# Patient Record
Sex: Female | Born: 1970 | Race: Black or African American | Hispanic: No | Marital: Married | Smoking: Never smoker
Health system: Southern US, Community
[De-identification: ages and names within clinical notes are randomized; demographics above are authoritative.]

## PROBLEM LIST (undated history)

## (undated) DIAGNOSIS — F419 Anxiety disorder, unspecified: Secondary | ICD-10-CM

## (undated) DIAGNOSIS — K5900 Constipation, unspecified: Secondary | ICD-10-CM

## (undated) DIAGNOSIS — F32A Depression, unspecified: Secondary | ICD-10-CM

## (undated) DIAGNOSIS — F329 Major depressive disorder, single episode, unspecified: Secondary | ICD-10-CM

## (undated) DIAGNOSIS — G500 Trigeminal neuralgia: Secondary | ICD-10-CM

## (undated) DIAGNOSIS — J302 Other seasonal allergic rhinitis: Secondary | ICD-10-CM

## (undated) HISTORY — DX: Major depressive disorder, single episode, unspecified: F32.9

## (undated) HISTORY — DX: Constipation, unspecified: K59.00

## (undated) HISTORY — DX: Anxiety disorder, unspecified: F41.9

## (undated) HISTORY — PX: TUBAL LIGATION: SHX77

## (undated) HISTORY — DX: Other seasonal allergic rhinitis: J30.2

## (undated) HISTORY — PX: CEREBRAL MICROVASCULAR DECOMPRESSION: SHX1328

## (undated) HISTORY — DX: Depression, unspecified: F32.A

---

## 1998-01-28 ENCOUNTER — Other Ambulatory Visit: Admission: RE | Admit: 1998-01-28 | Discharge: 1998-01-28 | Payer: Self-pay | Admitting: Gynecology

## 1999-03-08 ENCOUNTER — Other Ambulatory Visit: Admission: RE | Admit: 1999-03-08 | Discharge: 1999-03-08 | Payer: Self-pay | Admitting: Gynecology

## 1999-05-21 ENCOUNTER — Inpatient Hospital Stay (HOSPITAL_COMMUNITY): Admission: AD | Admit: 1999-05-21 | Discharge: 1999-05-21 | Payer: Self-pay | Admitting: Gynecology

## 1999-05-21 ENCOUNTER — Encounter: Payer: Self-pay | Admitting: Gynecology

## 1999-06-17 ENCOUNTER — Other Ambulatory Visit: Admission: RE | Admit: 1999-06-17 | Discharge: 1999-06-17 | Payer: Self-pay | Admitting: Gynecology

## 1999-07-14 ENCOUNTER — Encounter: Admission: RE | Admit: 1999-07-14 | Discharge: 1999-10-12 | Payer: Self-pay | Admitting: Gynecology

## 1999-12-13 ENCOUNTER — Encounter: Payer: Self-pay | Admitting: Gynecology

## 1999-12-14 ENCOUNTER — Encounter: Payer: Self-pay | Admitting: Gynecology

## 1999-12-14 ENCOUNTER — Inpatient Hospital Stay (HOSPITAL_COMMUNITY): Admission: AD | Admit: 1999-12-14 | Discharge: 1999-12-15 | Payer: Self-pay | Admitting: Internal Medicine

## 1999-12-19 ENCOUNTER — Inpatient Hospital Stay (HOSPITAL_COMMUNITY): Admission: AD | Admit: 1999-12-19 | Discharge: 1999-12-22 | Payer: Self-pay | Admitting: Gynecology

## 1999-12-19 ENCOUNTER — Encounter (INDEPENDENT_AMBULATORY_CARE_PROVIDER_SITE_OTHER): Payer: Self-pay | Admitting: Specialist

## 1999-12-25 ENCOUNTER — Encounter: Admission: RE | Admit: 1999-12-25 | Discharge: 2000-03-24 | Payer: Self-pay | Admitting: *Deleted

## 2000-01-24 ENCOUNTER — Other Ambulatory Visit: Admission: RE | Admit: 2000-01-24 | Discharge: 2000-01-24 | Payer: Self-pay | Admitting: Gynecology

## 2001-01-29 ENCOUNTER — Other Ambulatory Visit: Admission: RE | Admit: 2001-01-29 | Discharge: 2001-01-29 | Payer: Self-pay | Admitting: Gynecology

## 2002-03-05 ENCOUNTER — Other Ambulatory Visit: Admission: RE | Admit: 2002-03-05 | Discharge: 2002-03-05 | Payer: Self-pay | Admitting: Gynecology

## 2002-12-16 ENCOUNTER — Emergency Department (HOSPITAL_COMMUNITY): Admission: EM | Admit: 2002-12-16 | Discharge: 2002-12-16 | Payer: Self-pay | Admitting: Emergency Medicine

## 2003-01-06 ENCOUNTER — Emergency Department (HOSPITAL_COMMUNITY): Admission: EM | Admit: 2003-01-06 | Discharge: 2003-01-06 | Payer: Self-pay | Admitting: Emergency Medicine

## 2003-03-21 ENCOUNTER — Emergency Department (HOSPITAL_COMMUNITY): Admission: EM | Admit: 2003-03-21 | Discharge: 2003-03-21 | Payer: Self-pay | Admitting: Emergency Medicine

## 2003-03-25 ENCOUNTER — Other Ambulatory Visit: Admission: RE | Admit: 2003-03-25 | Discharge: 2003-03-25 | Payer: Self-pay | Admitting: Gynecology

## 2003-12-19 ENCOUNTER — Ambulatory Visit (HOSPITAL_COMMUNITY): Admission: RE | Admit: 2003-12-19 | Discharge: 2003-12-19 | Payer: Self-pay | Admitting: Gynecology

## 2003-12-19 ENCOUNTER — Encounter (INDEPENDENT_AMBULATORY_CARE_PROVIDER_SITE_OTHER): Payer: Self-pay | Admitting: *Deleted

## 2006-07-24 ENCOUNTER — Emergency Department (HOSPITAL_COMMUNITY): Admission: EM | Admit: 2006-07-24 | Discharge: 2006-07-24 | Payer: Self-pay | Admitting: Emergency Medicine

## 2006-11-15 ENCOUNTER — Inpatient Hospital Stay (HOSPITAL_COMMUNITY): Admission: AD | Admit: 2006-11-15 | Discharge: 2006-11-15 | Payer: Self-pay | Admitting: Obstetrics & Gynecology

## 2006-12-21 ENCOUNTER — Inpatient Hospital Stay (HOSPITAL_COMMUNITY): Admission: AD | Admit: 2006-12-21 | Discharge: 2006-12-22 | Payer: Self-pay | Admitting: Obstetrics and Gynecology

## 2007-01-12 ENCOUNTER — Inpatient Hospital Stay (HOSPITAL_COMMUNITY): Admission: AD | Admit: 2007-01-12 | Discharge: 2007-01-12 | Payer: Self-pay | Admitting: Obstetrics and Gynecology

## 2007-04-09 ENCOUNTER — Inpatient Hospital Stay (HOSPITAL_COMMUNITY): Admission: AD | Admit: 2007-04-09 | Discharge: 2007-04-09 | Payer: Self-pay | Admitting: Obstetrics and Gynecology

## 2007-05-14 ENCOUNTER — Inpatient Hospital Stay (HOSPITAL_COMMUNITY): Admission: AD | Admit: 2007-05-14 | Discharge: 2007-05-14 | Payer: Self-pay | Admitting: Obstetrics and Gynecology

## 2007-05-24 ENCOUNTER — Inpatient Hospital Stay (HOSPITAL_COMMUNITY): Admission: AD | Admit: 2007-05-24 | Discharge: 2007-05-24 | Payer: Self-pay | Admitting: Obstetrics and Gynecology

## 2007-06-11 ENCOUNTER — Observation Stay (HOSPITAL_COMMUNITY): Admission: AD | Admit: 2007-06-11 | Discharge: 2007-06-12 | Payer: Self-pay | Admitting: Obstetrics and Gynecology

## 2007-06-26 ENCOUNTER — Inpatient Hospital Stay (HOSPITAL_COMMUNITY): Admission: AD | Admit: 2007-06-26 | Discharge: 2007-06-29 | Payer: Self-pay | Admitting: Obstetrics and Gynecology

## 2007-06-26 ENCOUNTER — Inpatient Hospital Stay (HOSPITAL_COMMUNITY): Admission: AD | Admit: 2007-06-26 | Discharge: 2007-06-26 | Payer: Self-pay | Admitting: Obstetrics and Gynecology

## 2007-06-27 ENCOUNTER — Encounter (INDEPENDENT_AMBULATORY_CARE_PROVIDER_SITE_OTHER): Payer: Self-pay | Admitting: Obstetrics and Gynecology

## 2007-09-09 ENCOUNTER — Emergency Department (HOSPITAL_COMMUNITY): Admission: EM | Admit: 2007-09-09 | Discharge: 2007-09-09 | Payer: Self-pay | Admitting: Emergency Medicine

## 2009-08-21 ENCOUNTER — Emergency Department (HOSPITAL_COMMUNITY): Admission: EM | Admit: 2009-08-21 | Discharge: 2009-08-21 | Payer: Self-pay | Admitting: Emergency Medicine

## 2009-10-20 ENCOUNTER — Encounter: Admission: RE | Admit: 2009-10-20 | Discharge: 2009-10-20 | Payer: Self-pay | Admitting: Family Medicine

## 2009-12-07 ENCOUNTER — Encounter: Admission: RE | Admit: 2009-12-07 | Discharge: 2009-12-07 | Payer: Self-pay | Admitting: Family Medicine

## 2010-01-23 ENCOUNTER — Encounter: Payer: Self-pay | Admitting: Family Medicine

## 2010-05-18 NOTE — Op Note (Signed)
Kathleen Odonnell, Kathleen Odonnell                ACCOUNT NO.:  [id number]   MEDICAL RECORD NO.:  [id number]          PATIENT TYPE:  INP   LOCATION:  9122                          FACILITY:  [facility]   PHYSICIAN:  Naima A. Dillard, M.D. DATE OF BIRTH:  1970-04-04   DATE OF PROCEDURE:  06/27/2007  DATE OF DISCHARGE:                               OPERATIVE REPORT   PREOPERATIVE DIAGNOSIS:  Multiparity, desires sterilization.   POSTOPERATIVE DIAGNOSIS:  Multiparity, desire sterilization.   PROCEDURE:  Postpartum tubal ligation.   SURGEON:  Naima A. Normand Sloop, M.D.   ANESTHESIA:  General.   ESTIMATED BLOOD LOSS:  Minimal.   COMPLICATIONS:  None.   SPECIMENS:  Portions of right and left tubes sent to pathology.   FINDINGS:  Normal-appearing tubes bilaterally.  The patient went to  recovery room in stable condition.   PROCEDURE IN DETAIL:  Before the procedure, the patient was given the  risk, however, not limited to bleeding, infection, damage to internal  organs and also failure rate of about 1 in 200 which could result in  ectopic pregnancy.  The patient still desired to proceed.  She was taken  to the operating room, given general anesthesia, prepped and draped in  normal sterile fashion.  Bladder was also drained with in and out  catheter.  A 10-mm incision was made with the scalpel after 5 mL of  Marcaine was used in the incision and carried down to the fascia.  The  fascia was incised to the midline extended bilaterally using Mayo  scissors.  Peritoneum was identified, tented up, sharply.  The patient's  right fallopian tube was grasped with Babcock clamps, followed up to  fimbriated end about a centimeter.  Tube was ligated with 2-0 plain at  the mid portion of the tube and excised.  Hemostasis was assured.  The  patient's left mid portion of the tube was ligated with 2-0 plain and  excised about a centimeter.  Hemostasis was assured.  The tubes were  returned to the abdomen.  The fascia was  closed using 0 Vicryl.  The  skin was closed using 3-0 Monocryl in subcuticular fashion.  Sponge, lap  and needle counts were correct.  The patient went to recovery room in  stable condition.      Naima A. Normand Sloop, M.D.  Electronically Signed     NAD/MEDQ  D:  06/27/2007  T:  06/28/2007  Job:  [id number]
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
## 2010-05-18 NOTE — Discharge Summary (Signed)
Kathleen Odonnell, Kathleen Odonnell                ACCOUNT NO.:  [id number]   MEDICAL RECORD NO.:  [id number]          PATIENT TYPE:  INP   LOCATION:  9173                          FACILITY:  [facility]   PHYSICIAN:  Osborn Coho, M.D.   DATE OF BIRTH:  [birth year]   DATE OF ADMISSION:  06/11/2007  DATE OF DISCHARGE:  06/12/2007                               DISCHARGE SUMMARY   ADMITTING PHYSICIAN:  Dois Davenport A. Rivard, MD   DISCHARGING PHYSICIAN:  Osborn Coho, MD   ADMISSION DIAGNOSES:  1. Intrauterine pregnancy at 35-1/7 weeks.  2. Preterm contractions with no cervical change.   DISCHARGE DIAGNOSES:  1. Intrauterine pregnancy at 35-1/7 weeks.  2. Preterm contractions with no cervical change.   HOSPITAL PROCEDURES:  1. Electronic fetal monitoring.  2. IV fluid administration.  3. Analgesia.   HOSPITAL COURSE:  The patient was admitted to maternity admissions on  June 11, 2007, for contractions at 35-1/7 weeks.  Her cervix was 2, 75,  and -2.  She continued throughout the evening to have contractions  anywhere from 3-10 minutes apart with extreme discomfort.  She did not  change her cervix but was contracting too frequently to discharge home,  so she was admitted for overnight observation, IV hydration, and  analgesia.  She was given some Stadol through her IV and then later some  morphine as an injection and received IV fluids throughout the night.  The baby remained reactive and the patient did not sleep, but did  received some relief from the analgesia.  By morning time, fetal heart  rate was reactive.  There were very few contractions.  Cervix was  unchanged at 2, 50, and -3 vertex and the patient was feeling much more  comfortable and was ready to go home.   DISCHARGE MEDICATIONS:  The patient has Darvocet for p.r.n. use at home  and also has terbutaline 2.5 mg p.o. q.4 h. p.r.n. for use at home.  No  other prescriptions were given.   DISCHARGE LABORATORY DATA:  None except for her UA  which showed a few  bacteria on micro and trace leukocytes and was sent for C&S.   DISCHARGE INSTRUCTIONS:  Monitoring for labor and monitoring for  movement.   DISCHARGE FOLLOWUP:  On Friday, June 15, 2007, or p.r.n.   CONDITION ON DISCHARGE:  Good.      Marie L. Williams, C.N.M.      Osborn Coho, M.D.  Electronically Signed    MLW/MEDQ  D:  06/12/2007  T:  06/13/2007  Job:  [id number]

## 2010-05-21 NOTE — Discharge Summary (Signed)
[facility]  Patient:    Kathleen Odonnell, Kathleen Odonnell Visit Number: [id number] MRN: [id number]          Service Type: OBS Location: 910B [id number] Attending Physician:  Tonye Royalty Dictated by:   Antony Contras, [facility] Admit Date:  12/19/1999 Discharge Date: 12/22/1999                             Discharge Summary  DISCHARGE DIAGNOSES:          1. Intrauterine pregnancy at 36 weeks.                               2. Labile pregnancy induced hypertension with                                  proteinuria.                               3. Oligohydramnios.  PROCEDURES:                   1. Induction of labor.                               2. Normal spontaneous vaginal delivery of                                  viable infant over intact perineum.  HISTORY OF PRESENT ILLNESS:   Patient is a 40 year old primigravida with an LMP of April 11, 1999 and [facility] January 16, 2000. Prenatal course was complicated labile pregnancy induced hypertension with proteinuria, oligohydramnios, headaches, lower extremity swelling, and elevated blood pressure. For both of these problems, she will be admitted for induction of labor at 36 weeks.  HOSPITAL COURSE/TREATMENT:    Patient was admitted on December 20, 1999 for induction of labor. Labor induction was initiated with Cytotec and followed by Pitocin. Artificial rupture of membranes revealed clear amniotic fluid. Cervix was 2 cm, 80%, and -1 station. Patient did experience some decreased long-term variability with good recovery, and some subtle late decelerations were noted towards the beginning of the second stage of labor which resolved. She did deliver an Apgar 8/9 female infant over an intact perineum. Birth weight was 4 pounds 15 ounces.  Postpartum, patient remained afebrile, had no difficulty voiding, blood pressures were in the 120-140/80-90 range. CBC: hematocrit 32.6, hemoglobin 11.4, white blood cells 12.6,  platelets 132. She was able to be discharged on her second postpartum day in satisfactory condition.  DISPOSITION:                  Follow up in the office in six weeks, continue prenatal vitamins and iron, Motrin/Tylox for pain. Dictated by:   Antony Contras, [facility] Attending Physician:  Tonye Royalty DD:  01/17/00 TD:  01/17/00 Job: [id number]
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
## 2010-05-21 NOTE — Discharge Summary (Signed)
[facility]  Patient:    Kathleen Odonnell, Kathleen Odonnell                       MRN: [id number] Adm. Date:  [id number] Disc. Date: [id number] Attending:  Tonye Royalty Dictator:   Antony Contras, [facility]                           Discharge Summary  DISCHARGE DIAGNOSES:          1. Intrauterine pregnancy at 35+ weeks.                               2. Headaches.                               3. Epigastric pain.  HISTORY OF PRESENT ILLNESS:   The patient is a 40 year old primigravida with LMP of April 11, 1999, [facility] January 16, 2000.  The patient around [redacted] weeks gestation began experiencing some midepigastric pain, headaches, and also noted some swelling of her lower extremities.  Initial blood pressures were found to be 150/90, 124/78.  She had also had 1+ proteinuria in her urine and 3+ pitting edema.  There was also evidence on ultrasound of oligohydramnios. Biophysical profile was 10/10.  PRENATAL LABORATORY DATA:     Blood type B positive, antibody screen negative, sickle cell negative.  RPR, HBsAg, and HIV nonreactive.  MS-AFP negative.  HOSPITAL COURSE:              She was admitted for observation of PIH panel which previously had been normal on November 7 and November 28.  PIH labs were within normal pregnancy parameters.  The patient did continue to complain of headache and some right upper quadrant pain.  Magnesium sulfate was initiated, and a 24-hour urine was also collected for total protein and creatinine clearance.  The patient continued to have headache which did resolve after magnesium sulfate was discontinued.  She continued to improve.  By December 1, her headache had resolved, she had no visual changes or epigastric pain.  The plan was to discharge the patient if 24-hour urine was normal.  She was to maintain bed rest at home and recheck an AFI, NST, and blood pressure in 48 hours.  She was able to be discharged on December 15, 1999.  FOLLOWUP:                     Follow up in the office in 48 hours.  Continue with bed rest at home. DD:  02/21/00 TD:  02/22/00 Job: [id number]

## 2010-05-21 NOTE — Op Note (Signed)
NAMECHANTALE, LEUGERS                ACCOUNT NO.:  [id number]   MEDICAL RECORD NO.:  [id number]          PATIENT TYPE:  AMB   LOCATION:  SDC                           FACILITY:  [facility]   PHYSICIAN:  Ivor Costa. Farrel Gobble, M.D. DATE OF BIRTH:  08-18-1970   DATE OF PROCEDURE:  12/19/2003  DATE OF DISCHARGE:                                 OPERATIVE REPORT   PREOPERATIVE DIAGNOSES:  Undesired pregnancy.   POSTOPERATIVE DIAGNOSES:  Undesired pregnancy.   PROCEDURE:  Voluntary interruption.   SURGEON:  Ivor Costa. Farrel Gobble, M.D.   ANESTHESIA:  MAC with paracervical block.   ESTIMATED BLOOD LOSS:  Minimal.   FINDINGS:  Axial uterus approximately 12 week size with smooth contours.   PATHOLOGY:  Uterine contents.   DESCRIPTION OF PROCEDURE:  The patient was taken to the operating room, IV  sedation MAC was induced, placed in dorsal lithotomy position, prepped and  draped in the usual sterile fashion.  Bimanual exam was performed, the  orientation of the uterus confirmed.  A bivalve speculum was placed in the  vagina, the cervix was stabilized with a single tooth tenaculum.  Paracervical block with equal portions of 2% lidocaine and 0.25% Marcaine  were placed circumferentially around the cervix.  The orientation of the  canal was confirmed with the uterine sound, the cervix was then dilated up  to 38 [place] after which a #12 suction curette was advanced through the  cervix towards the fundus.  An amniotomy was performed immediately.  Passage  of fetus and what appeared to be placental tissue occurred immediately on a  single pass. The suction was then readvanced towards the fundus and scant  tissue was obtained.  Polyp forceps were then advanced through the cervix  and gently palpated around, it felt empty. A gentle curettage showed normal  crie and smooth contours. The suction curette was advanced for a third time  and no blood was obtained.  The patient tolerated the procedure well.  Sponge,  sharp, needle counts correct x2.  She was given Methergine  intraoperatively and transferred to the PACU in stable condition.     Trac   THL/MEDQ  D:  12/19/2003  T:  12/20/2003  Job:  [id number]
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
## 2010-09-22 LAB — CBC
Hemoglobin: 11.2 — ABNORMAL LOW
Platelets: 241
RDW: 14.2
WBC: 11.1 — ABNORMAL HIGH

## 2010-09-22 LAB — URINALYSIS, ROUTINE W REFLEX MICROSCOPIC
Bilirubin Urine: NEGATIVE
Hgb urine dipstick: NEGATIVE
Ketones, ur: 15 — AB
Specific Gravity, Urine: 1.02
Urobilinogen, UA: 1

## 2010-09-22 LAB — DIFFERENTIAL
Basophils Absolute: 0
Lymphocytes Relative: 18
Lymphs Abs: 2
Neutro Abs: 8.2 — ABNORMAL HIGH

## 2010-09-22 LAB — GC/CHLAMYDIA PROBE AMP, GENITAL: Chlamydia, DNA Probe: NEGATIVE

## 2010-09-22 LAB — WET PREP, GENITAL: Clue Cells Wet Prep HPF POC: NONE SEEN

## 2010-09-28 LAB — URINALYSIS, ROUTINE W REFLEX MICROSCOPIC
Nitrite: NEGATIVE
Specific Gravity, Urine: <1.005 — ABNORMAL LOW
pH: 5.5

## 2010-09-29 LAB — URINALYSIS, ROUTINE W REFLEX MICROSCOPIC
Nitrite: NEGATIVE
Specific Gravity, Urine: 1.01
Urobilinogen, UA: 0.2

## 2010-09-29 LAB — URINE MICROSCOPIC-ADD ON

## 2010-09-30 LAB — STREP B DNA PROBE: Strep Group B Ag: NEGATIVE

## 2010-09-30 LAB — SYPHILIS: RPR W/REFLEX TO RPR TITER AND TREPONEMAL ANTIBODIES, TRADITIONAL SCREENING AND DIAGNOSIS ALGORITHM: RPR Ser Ql: NONREACTIVE

## 2010-09-30 LAB — URINALYSIS, ROUTINE W REFLEX MICROSCOPIC
Bilirubin Urine: NEGATIVE
Ketones, ur: NEGATIVE
Nitrite: NEGATIVE
Urobilinogen, UA: 0.2
pH: 7.5

## 2010-09-30 LAB — URINE MICROSCOPIC-ADD ON

## 2010-09-30 LAB — CBC
HCT: 32.7 — ABNORMAL LOW
Hemoglobin: 10.8 — ABNORMAL LOW
MCHC: 33.2
MCV: 84.9
MCV: 85.3
Platelets: 202
RBC: 3.09 — ABNORMAL LOW
RBC: 3.85 — ABNORMAL LOW
RDW: 16.1 — ABNORMAL HIGH
WBC: 12.5 — ABNORMAL HIGH
WBC: 12.6 — ABNORMAL HIGH

## 2010-10-08 LAB — URINALYSIS, ROUTINE W REFLEX MICROSCOPIC
Glucose, UA: NEGATIVE
Ketones, ur: NEGATIVE
Specific Gravity, Urine: 1.01
pH: 6.5

## 2010-10-12 LAB — URINALYSIS, ROUTINE W REFLEX MICROSCOPIC
Nitrite: NEGATIVE
Specific Gravity, Urine: 1.01
pH: 7

## 2010-10-12 LAB — WET PREP, GENITAL
Clue Cells Wet Prep HPF POC: NONE SEEN
Trich, Wet Prep: NONE SEEN
Yeast Wet Prep HPF POC: NONE SEEN

## 2010-10-12 LAB — HCG, QUANTITATIVE, PREGNANCY: hCG, Beta Chain, Quant, S: 4906 — ABNORMAL HIGH

## 2010-10-12 LAB — CBC
MCV: 87.4
Platelets: 307
RBC: 4.04
WBC: 8.4

## 2010-10-12 LAB — ABO/RH: ABO/RH(D): B POS

## 2010-10-12 LAB — POCT PREGNANCY, URINE: Operator id: 120561

## 2011-02-17 ENCOUNTER — Other Ambulatory Visit: Payer: Self-pay | Admitting: Family Medicine

## 2011-02-17 DIAGNOSIS — N63 Unspecified lump in unspecified breast: Secondary | ICD-10-CM

## 2011-02-24 ENCOUNTER — Ambulatory Visit
Admission: RE | Admit: 2011-02-24 | Discharge: 2011-02-24 | Disposition: A | Discharge status: Home / Self Care | Payer: BC Managed Care – PPO | Source: Ambulatory Visit | Attending: Family Medicine | Admitting: Family Medicine

## 2011-02-24 DIAGNOSIS — N63 Unspecified lump in unspecified breast: Secondary | ICD-10-CM

## 2012-02-02 ENCOUNTER — Other Ambulatory Visit: Payer: Self-pay | Admitting: Family Medicine

## 2012-02-02 DIAGNOSIS — Z1231 Encounter for screening mammogram for malignant neoplasm of breast: Secondary | ICD-10-CM

## 2012-02-27 ENCOUNTER — Ambulatory Visit
Admission: RE | Admit: 2012-02-27 | Discharge: 2012-02-27 | Disposition: A | Discharge status: Home / Self Care | Payer: BC Managed Care – PPO | Source: Ambulatory Visit | Attending: Family Medicine | Admitting: Family Medicine

## 2012-02-27 DIAGNOSIS — Z1231 Encounter for screening mammogram for malignant neoplasm of breast: Secondary | ICD-10-CM

## 2013-03-27 ENCOUNTER — Other Ambulatory Visit: Payer: Self-pay

## 2013-03-27 DIAGNOSIS — Z1231 Encounter for screening mammogram for malignant neoplasm of breast: Secondary | ICD-10-CM

## 2013-04-05 ENCOUNTER — Ambulatory Visit: Payer: BC Managed Care – PPO

## 2013-04-11 ENCOUNTER — Ambulatory Visit: Payer: BC Managed Care – PPO

## 2013-04-15 ENCOUNTER — Ambulatory Visit
Admission: RE | Admit: 2013-04-15 | Discharge: 2013-04-15 | Disposition: A | Discharge status: Home / Self Care | Payer: BC Managed Care – PPO | Source: Ambulatory Visit

## 2013-04-15 DIAGNOSIS — Z1231 Encounter for screening mammogram for malignant neoplasm of breast: Secondary | ICD-10-CM

## 2013-04-16 ENCOUNTER — Other Ambulatory Visit: Payer: Self-pay | Admitting: Family Medicine

## 2013-04-16 DIAGNOSIS — R928 Other abnormal and inconclusive findings on diagnostic imaging of breast: Secondary | ICD-10-CM

## 2013-04-25 ENCOUNTER — Ambulatory Visit
Admission: RE | Admit: 2013-04-25 | Discharge: 2013-04-25 | Disposition: A | Discharge status: Home / Self Care | Payer: BC Managed Care – PPO | Source: Ambulatory Visit | Attending: Family Medicine | Admitting: Family Medicine

## 2013-04-25 ENCOUNTER — Encounter (INDEPENDENT_AMBULATORY_CARE_PROVIDER_SITE_OTHER): Payer: Self-pay

## 2013-04-25 DIAGNOSIS — R928 Other abnormal and inconclusive findings on diagnostic imaging of breast: Secondary | ICD-10-CM

## 2014-04-21 ENCOUNTER — Other Ambulatory Visit (HOSPITAL_COMMUNITY)
Admission: RE | Admit: 2014-04-21 | Discharge: 2014-04-21 | Disposition: A | Discharge status: Home / Self Care | Payer: BC Managed Care – PPO | Source: Ambulatory Visit | Attending: Family Medicine | Admitting: Family Medicine

## 2014-04-21 ENCOUNTER — Other Ambulatory Visit: Payer: Self-pay | Admitting: Family Medicine

## 2014-04-21 DIAGNOSIS — Z124 Encounter for screening for malignant neoplasm of cervix: Secondary | ICD-10-CM | POA: Diagnosis present

## 2014-04-23 LAB — CYTOLOGY - PAP

## 2015-01-05 ENCOUNTER — Ambulatory Visit (INDEPENDENT_AMBULATORY_CARE_PROVIDER_SITE_OTHER): Payer: BC Managed Care – PPO | Admitting: Licensed Clinical Social Worker

## 2015-01-05 DIAGNOSIS — F331 Major depressive disorder, recurrent, moderate: Secondary | ICD-10-CM

## 2015-01-19 ENCOUNTER — Ambulatory Visit (INDEPENDENT_AMBULATORY_CARE_PROVIDER_SITE_OTHER): Payer: BC Managed Care – PPO | Admitting: Licensed Clinical Social Worker

## 2015-01-19 DIAGNOSIS — F331 Major depressive disorder, recurrent, moderate: Secondary | ICD-10-CM | POA: Diagnosis not present

## 2015-02-02 ENCOUNTER — Ambulatory Visit: Payer: BC Managed Care – PPO | Admitting: Licensed Clinical Social Worker

## 2015-02-16 ENCOUNTER — Ambulatory Visit: Payer: BC Managed Care – PPO | Admitting: Licensed Clinical Social Worker

## 2015-07-10 ENCOUNTER — Other Ambulatory Visit: Payer: Self-pay | Admitting: Family Medicine

## 2015-07-10 DIAGNOSIS — Z1231 Encounter for screening mammogram for malignant neoplasm of breast: Secondary | ICD-10-CM

## 2015-07-15 ENCOUNTER — Ambulatory Visit
Admission: RE | Admit: 2015-07-15 | Discharge: 2015-07-15 | Disposition: A | Discharge status: Home / Self Care | Payer: BC Managed Care – PPO | Source: Ambulatory Visit | Attending: Family Medicine | Admitting: Family Medicine

## 2015-07-15 DIAGNOSIS — Z1231 Encounter for screening mammogram for malignant neoplasm of breast: Secondary | ICD-10-CM

## 2015-12-01 ENCOUNTER — Encounter: Payer: Self-pay | Admitting: Emergency Medicine

## 2015-12-01 ENCOUNTER — Emergency Department (HOSPITAL_COMMUNITY)
Admission: EM | Admit: 2015-12-01 | Discharge: 2015-12-01 | Disposition: A | Discharge status: Home / Self Care | Payer: BC Managed Care – PPO | Attending: Emergency Medicine | Admitting: Emergency Medicine

## 2015-12-01 DIAGNOSIS — M5481 Occipital neuralgia: Secondary | ICD-10-CM | POA: Diagnosis not present

## 2015-12-01 DIAGNOSIS — R51 Headache: Secondary | ICD-10-CM | POA: Diagnosis present

## 2015-12-01 HISTORY — DX: Trigeminal neuralgia: G50.0

## 2015-12-01 MED ORDER — BUPIVACAINE HCL (PF) 0.5 % IJ SOLN
10.0000 mL | Freq: Once | INTRAMUSCULAR | Status: AC
  Administered 2015-12-01: 10 mL
  Filled 2015-12-01: qty 10

## 2015-12-01 MED ORDER — GABAPENTIN 100 MG PO CAPS: 100.0000 mg | ORAL_CAPSULE | Freq: Three times a day (TID) | ORAL | 0 refills | Status: DC

## 2015-12-01 MED ORDER — OXYCODONE-ACETAMINOPHEN 5-325 MG PO TABS: 1.0000 | ORAL_TABLET | ORAL | 0 refills | Status: DC | PRN

## 2015-12-01 NOTE — ED Notes (Signed)
Pt ambulatory at discharge. NAD. Verbalized understanding of need to follow up with Neurology.

## 2015-12-01 NOTE — ED Triage Notes (Signed)
Pt reports onset today of posterior headache while at work. Reports headache caused her to feel dizzy and loose her balance. Denies n/v or photophobia. Hx of headaches.

## 2015-12-01 NOTE — ED Provider Notes (Signed)
[facility]-EMERGENCY DEPT Provider Note   CSN: [id number] Arrival date & time: 12/01/15  1353  By signing my name below, I, Freida Busmaniana Omoyeni, attest that this documentation has been prepared under the direction and in the presence of Raeford RazorStephen Allia Wiltsey, MD . Electronically Signed: Freida Busmaniana Omoyeni, Scribe. 12/01/2015. 4:26 PM.  History   Chief Complaint Chief Complaint  Patient presents with  . Headache   The history is provided by the patient. No language interpreter was used.   HPI Comments:  Kathleen Odonnell is a 45 y.o. female with a history of trigeminal neuralgia, and microvascular decompression 6 years ago,  who presents to the Emergency Department complaining of a HA which began ~1000 this AM. Pt describes her pain as a sharp pain in the left occipital region. She also describes a lump to the site that is tender to palpation. She reports associated dizziness when she went from a seated position to a standing position.  She states she had another HA ~ 1 week ago that resolved. She also notes h/o HA secondary to past surgeries. [facility] with little relief. No nausea, vomiting, or photophobia.  Past Medical History:  Diagnosis Date  . Trigeminal neuralgia     There are no active problems to display for this patient.   History reviewed. No pertinent surgical history.  OB History    No data available       Home Medications    Prior to Admission medications   Not on File    Family History History reviewed. No pertinent family history.  Social History Social History  Substance Use Topics  . Smoking status: Never Smoker  . Smokeless tobacco: Not on file  . Alcohol use Yes     Comment: occ     Allergies   Cambendazole   Review of Systems Review of Systems  Constitutional: Negative for fever.  Eyes: Negative for photophobia.  Gastrointestinal: Negative for nausea and vomiting.  Neurological: Positive for dizziness and headaches. Negative for syncope,  facial asymmetry and weakness.  All other systems reviewed and are negative.    Physical Exam Updated Vital Signs BP 145/87 (BP Location: Right Arm)   Pulse 71   Temp 97.9 F (36.6 C) (Oral)   Resp 18   LMP 11/24/2015   SpO2 100%   Physical Exam  Constitutional: She is oriented to person, place, and time. She appears well-developed and well-nourished. No distress.  HENT:  Head: Normocephalic and atraumatic.  Eyes: EOM are normal.  Neck: Normal range of motion.  Cardiovascular: Normal rate, regular rhythm and normal heart sounds.   Pulmonary/Chest: Effort normal and breath sounds normal.  Abdominal: Soft. She exhibits no distension. There is no tenderness.  Musculoskeletal: Normal range of motion.  Neurological: She is alert and oriented to person, place, and time.  Point tender over left greater occipital nerve   Skin: Skin is warm and dry.  Psychiatric: She has a normal mood and affect. Judgment normal.  Nursing note and vitals reviewed.    ED Treatments / Results  DIAGNOSTIC STUDIES:  Oxygen Saturation is 100% on RA, normal by my interpretation.    COORDINATION OF CARE:  4:22 PM Discussed treatment plan with pt at bedside and pt agreed to plan.  Labs (all labs ordered are listed, but only abnormal results are displayed) Labs Reviewed - No data to display  EKG  EKG Interpretation None       Radiology No results found.  Procedures .Nerve Block Date/Time:  12/01/2015 5:14 PM Performed by: Raeford RazorKOHUT, Lyzette Reinhardt Authorized by: Raeford RazorKOHUT, Lamond Glantz   Consent:    Consent obtained:  Verbal   Consent given by:  Patient Indications:    Indications:  Pain relief Location:    Body area:  Head   Head nerve:  Occipital   Laterality:  Left Pre-procedure details:    Skin preparation:  2% chlorhexidine Procedure details (see MAR for exact dosages):    Block needle gauge:  25 G (1.5 ccs)   Anesthetic injected:  Bupivacaine 0.25% w/o epi Post-procedure details:     Dressing:  None   Patient tolerance of procedure:  Tolerated well, no immediate complications     (including critical care time)  Medications Ordered in ED Medications  bupivacaine (MARCAINE) 0.5 % injection 10 mL (not administered)     Initial Impression / Assessment and Plan / ED Course  I have reviewed the triage vital signs and the nursing notes.  Pertinent labs & imaging results that were available during my care of the patient were reviewed by me and considered in my medical decision making (see chart for details).  Clinical Course     45 year old female with headache. Symptoms consistent with occipital neuralgia. Doubt emergent cause for headache. Outpt Neurology FU.   Final Clinical Impressions(s) / ED Diagnoses   Final diagnoses:  Occipital neuralgia of left side    New Prescriptions New Prescriptions   No medications on file   I personally preformed the services scribed in my presence. The recorded information has been reviewed is accurate. Raeford RazorStephen Shivam Mestas, MD.     Raeford RazorStephen Winnifred Dufford, MD 12/10/15 [id number]

## 2015-12-03 ENCOUNTER — Encounter: Payer: Self-pay | Admitting: Neurology

## 2015-12-03 ENCOUNTER — Ambulatory Visit (INDEPENDENT_AMBULATORY_CARE_PROVIDER_SITE_OTHER): Payer: BC Managed Care – PPO | Admitting: Neurology

## 2015-12-03 VITALS — BP 117/73 | HR 81 | Resp 16 | Ht 59.75 in | Wt 188.0 lb

## 2015-12-03 DIAGNOSIS — M5481 Occipital neuralgia: Secondary | ICD-10-CM | POA: Diagnosis not present

## 2015-12-03 DIAGNOSIS — Z8669 Personal history of other diseases of the nervous system and sense organs: Secondary | ICD-10-CM | POA: Diagnosis not present

## 2015-12-03 MED ORDER — GABAPENTIN 100 MG PO CAPS: 100.0000 mg | ORAL_CAPSULE | Freq: Three times a day (TID) | ORAL | 3 refills | Status: DC

## 2015-12-03 NOTE — Patient Instructions (Addendum)
You may have left occipital neuralgia.   Your exam is good.   Please for now, continue your gabapentin, 100 mg 3 times a day.   Try over the counter advil or motrin as needed.   Please ask family and your husband if you snore and if so, how loud it is, and if you have breathing related issues in your sleep, such as: snorting sounds, choking sounds, pauses in your breathing or shallow breathing events. These may be symptoms of obstructive sleep apnea (OSA).   We will do a brain scan, called MRI and call you with the test results. We will have to schedule you for this on a separate date. This test requires authorization from your insurance, and we will take care of the insurance process.

## 2015-12-03 NOTE — Progress Notes (Signed)
Subjective:    Patient ID: Kathleen Odonnell is a 45 y.o. female.  HPI     Kathleen FoleySaima Danelle Curiale, MD, PhD [facility] [address], [address] [address] [place], [place] [id number]  I saw patient Kathleen Odonnell as a referral from the emergency room for new onset occipital headache and dizziness. The patient is a 45 year old right-handed woman with an underlying medical history of obesity and bilateral trigeminal neuralgia, status post microvascular decompression bilaterally, R side some 8 years ago and about 8 months later on the L side. TGN improved after that, and she stopped the neurontin at the time.  She presented to the emergency room on 12/01/2015 with headache and dizziness. The patient is unaccompanied today. I reviewed the emergency room records. She had pain in the left occipital area. She had associated dizziness which occurred with sudden change in position, especially standing from the seated position. She had also felt a swelling in the area of pain. She was treated symptomatically with an occipital block and was given an RX for gabapentin, 100 mg tid, which she started. She feels mildly sedated from it, but not enough to impair driving. She was also given a Rx for hydrocodone in the ER and took 1 pill thus far, but her dizziness became worse.  She had another bout of pain later that evening when she went home from the emergency room, pain is short-lived, usually a few minutes at a time, sharp, stabbing, and the left occipital area radiating forward, she has had intermittent lightheadedness, denies any visual aura, no nausea, no vomiting, no photophobia or sonophobia. She has had over the past years intermittent right facial pain, resembling her trigeminal neuralgia pain but overall has had no significant problems since her bilateral surgeries. She has no personal history of migraine headaches. She does endorse increase in stress in the past year. 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 She lives at home with her family, husband and 47-year-old son, as well as an 47 year old daughter. She is a nonsmoker and drinks alcohol very infrequently. She tries to hydrate with water. She has been exercising and has been able to lose weight but since school started she has gained some weight back. She has a history of mild intermittent snoring, worse with congestion but no history of breathing pauses while asleep or gasping sensations while asleep. Husband has obstructive sleep apnea and uses a CPAP machine. She is familiar with the diagnosis of OSA. She currently feels mildly lightheaded, denies any vertigo type symptoms, no nausea. Had an eye exam in the past 3 months or so and also a dental exam.   Her Past Medical History Is Significant For: Past Medical History:  Diagnosis Date  . Seasonal allergies   . Trigeminal neuralgia     Her Past Surgical History Is Significant For: Past Surgical History:  Procedure Laterality Date  . CEREBRAL MICROVASCULAR DECOMPRESSION    . CESAREAN SECTION    . TUBAL LIGATION      Her Family History Is Significant For: No family history on file.  Her Social History Is Significant For: Social History   Social History  . Marital status: Married    Spouse name: N/A  . Number of children: 2  . Years of education: Masters   Social History Main Topics  . Smoking status: Never Smoker  . Smokeless tobacco: None  . Alcohol use Yes     Comment:  occ  . Drug use: No  . Sexual activity: Not Asked   Other Topics Concern  . None   Social History Narrative   Drinks 1-2 caffeine drinks a day     Her Allergies Are:  Allergies  Allergen Reactions  . Cambendazole   :   Her Current Medications Are:  Outpatient Encounter Prescriptions as of 12/03/2015  Medication Sig  . Aspirin-Salicylamide-Caffeine (BC HEADACHE POWDER PO) Take by mouth.  [name] BLACK COHOSH PO Take by mouth.  .  gabapentin (NEURONTIN) 100 MG capsule Take 1 capsule (100 mg total) by mouth 3 (three) times daily.  [name] loratadine (CLARITIN) 10 MG tablet Take 10 mg by mouth daily.  . Multiple Vitamin (MULTIVITAMIN) capsule Take 1 capsule by mouth daily.  . polycarbophil (FIBERCON) 625 MG tablet Take 625 mg by mouth daily.  [name] oxyCODONE-acetaminophen (PERCOCET/ROXICET) 5-325 MG tablet Take 1 tablet by mouth every 4 (four) hours as needed for severe pain. (Patient not taking: Reported on 12/03/2015)   No facility-administered encounter medications on file as of 12/03/2015.   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 Experienced dizziness/unstreadiness. Went to Ed for this.     Objective:  Neurologic Exam  Physical Exam Physical Examination:   Vitals:   12/03/15 0951 12/03/15 1001  BP: 123/76 117/73  Pulse: 75 81  Resp: 16    She has no significant orthostatic blood pressure or pulse changes, mild drop in the systolic blood pressure value, mild lightheadedness reported upon standing, no vertigo.  General Examination: The patient is a very pleasant 45 y.o. female in no acute distress. She appears well-developed and well-nourished and well groomed.   HEENT: Normocephalic, atraumatic, fairly unremarkable scars behind both ears, no swelling, no significant tenderness in her back of her head and neck muscles. Pupils are equal, round and reactive to light and accommodation. Funduscopic exam is normal with sharp disc margins noted. Extraocular tracking is good without limitation to gaze excursion or nystagmus noted. Normal smooth pursuit is noted. Hearing is grossly intact. Face is symmetric with normal facial animation and normal facial sensation. Speech is clear with no dysarthria  noted. There is no hypophonia. There is no lip, neck/head, jaw or voice tremor. Neck is supple with full range of passive and active motion. There are no carotid bruits on auscultation. Oropharynx exam reveals: mild mouth dryness, good dental hygiene and mild airway crowding, due to Smaller airway entry, tonsils on the small side, uvula not enlarged, Mallampati is class II. Tongue protrudes centrally and palate elevates symmetrically.   Chest: Clear to auscultation without wheezing, rhonchi or crackles noted.  Heart: S1+S2+0, regular and normal without murmurs, rubs or gallops noted.   Abdomen: Soft, non-tender and non-distended with normal bowel sounds appreciated on auscultation.  Extremities: There is no pitting edema in the distal lower extremities bilaterally. Pedal pulses are intact.  Skin: Warm and dry without trophic changes noted. There are no varicose veins.  Musculoskeletal: exam reveals no obvious joint deformities, tenderness or joint swelling or erythema.   Neurologically:  Mental status: The patient is awake, alert and oriented in all 4 spheres. Her immediate and remote memory, attention, language skills and fund of knowledge are appropriate. There is no evidence  of aphasia, agnosia, apraxia or anomia. Speech is clear with normal prosody and enunciation. Thought process is linear. Mood is normal and affect is normal.  Cranial nerves II - XII are as described above under HEENT exam. In addition: shoulder shrug is normal with equal shoulder height noted. Motor exam: Normal bulk, strength and tone is noted. There is no drift, tremor or rebound. Romberg is negative, except minimal sway. Reflexes are 2+ throughout. Babinski: Toes are flexor bilaterally. Fine motor skills and coordination: intact with normal finger taps, normal hand movements, normal rapid alternating patting, normal foot taps and normal foot agility.  Cerebellar testing: No dysmetria or intention tremor on finger to nose  testing. Heel to shin is unremarkable bilaterally. There is no truncal or gait ataxia.  Sensory exam: intact to light touch, pinprick, vibration, temperature sense in the upper and lower extremities.  Gait, station and balance: She stands easily. No veering to one side is noted. No leaning to one side is noted. Posture is age-appropriate and stance is narrow based. Gait shows normal stride length and normal pace. No problems turning are noted. Tandem walk is unremarkable.   Assessment and Plan:  In summary, Kathleen Odonnell is a very pleasant 45 y.o.-year old female  with an underlying medical history of obesity and bilateral trigeminal neuralgia, status post microvascular decompression bilaterally,  who presents for initial consultation of her new onset occipital headache on the left associated with dizziness. On examination, she has a nonfocal neurological exam, feels mildly lightheaded upon standing and has a mild drop in systolic blood pressure, no vertigo, no current shooting pain. She was given a prescription for gabapentin in the emergency room where she presented on 12/01/2015. We talked about her emergency room visit and treatment. She also received a left occipital nerve block which helped for a few hours. She was given a prescription for hydrocodone and after taking one pill she felt more off balance and lightheaded. She most likely has left occipital neuralgia. She does not have any telltale symptoms of trigeminal neuralgia recurrence even though she does have a very rare right facial pain since her surgery several years ago. Reassuringly, her neurological exam is nonfocal. She is advised to continue the gabapentin 100 mg 3 times a day. She is advised to look out for sedation and not drive if she is sleepy. Furthermore, she is advised to stay well hydrated and well rested. She does endorse stress in the past year. She is advised to try to find means to reduce her stress. We will go ahead and  proceed with a brain MRI with and without contrast to rule out any structural lesions or signs of inflammation or recurrence of trigeminal neuralgia type changes even though her current presentation is not in keeping with trigeminal neuralgia recurrence. We talked about potential headache triggers. She is advised for as needed use she can take Advil or Motrin over-the-counter for when the pain flares up. I suggested that she avoid narcotic pain medication. I would like to see her back routinely in about 3 months, sooner as needed. I answered all her questions today and she was in agreement.  Kathleen FoleySaima Isolde Skaff, MD, PhD

## 2015-12-04 ENCOUNTER — Telehealth: Payer: Self-pay | Admitting: Neurology

## 2015-12-04 NOTE — Telephone Encounter (Signed)
[employer] insurance did not approve the MR Brain w/wo contrast. I spoke with one of the [employer] nurses and she said it needed more clinical information and she couldn't approve it on the nurse level. The peer to peer phone number is [phone], the members ID number is [id number] & DOB is 08/30/1970. The [employer] nurse said the case was closing on 12/08/15. Thank you for your help.

## 2015-12-08 NOTE — Telephone Encounter (Signed)
Sending this to you too.[name]..[name]

## 2015-12-16 ENCOUNTER — Telehealth: Payer: Self-pay | Admitting: Neurology

## 2015-12-16 NOTE — Telephone Encounter (Signed)
Pt called said she has rec'd 2 letters from her insurance stating the MRI has been denied. Please call

## 2015-12-16 NOTE — Telephone Encounter (Signed)
Please advise patient that her insurance denied her MRI brain. We will follow her clinically and address the need for an MRI at our next visit which is in February.

## 2015-12-16 NOTE — Telephone Encounter (Signed)
I spoke to pt already regarding this today on 12/16/2015. No further follow up is needed from FithianEmily at this time.

## 2015-12-16 NOTE — Telephone Encounter (Signed)
I spoke to pt and advised her that her MRI brain was denied by insurance and that Dr. Frances FurbishAthar recommends following her clinically and address the need for an MRI at the next visit in February. Pt is agreeable to this. Pt says that she is having some pain in her head that is worse with loud noises, stress, and sneezing. However, pt says that she will keep a log of her symptoms and discuss at the office visit in February. I encouraged the pt to call us in the interim with any other questions or concerns. Pt verbalized understanding.

## 2016-02-23 ENCOUNTER — Ambulatory Visit (INDEPENDENT_AMBULATORY_CARE_PROVIDER_SITE_OTHER): Payer: BC Managed Care – PPO | Admitting: Neurology

## 2016-02-23 ENCOUNTER — Encounter: Payer: Self-pay | Admitting: Neurology

## 2016-02-23 VITALS — BP 98/64 | HR 72 | Resp 20 | Ht 59.75 in | Wt 196.0 lb

## 2016-02-23 DIAGNOSIS — R51 Headache: Secondary | ICD-10-CM

## 2016-02-23 DIAGNOSIS — R351 Nocturia: Secondary | ICD-10-CM | POA: Diagnosis not present

## 2016-02-23 DIAGNOSIS — R519 Headache, unspecified: Secondary | ICD-10-CM

## 2016-02-23 DIAGNOSIS — E669 Obesity, unspecified: Secondary | ICD-10-CM

## 2016-02-23 DIAGNOSIS — Z8669 Personal history of other diseases of the nervous system and sense organs: Secondary | ICD-10-CM

## 2016-02-23 DIAGNOSIS — R0683 Snoring: Secondary | ICD-10-CM | POA: Diagnosis not present

## 2016-02-23 MED ORDER — GABAPENTIN 100 MG PO CAPS: ORAL_CAPSULE | ORAL | 5 refills | Status: DC

## 2016-02-23 NOTE — Progress Notes (Signed)
Subjective:    Patient ID: Kathleen Odonnell is a 46 y.o. female.  HPI     Interim history:   Kathleen Odonnell is a 46 year old right-handed woman with an underlying medical history of obesity and bilateral trigeminal neuralgia, status post microvascular decompression bilaterally (R side some 8 years ago and about 8 months later on the L side), who presents for follow-up consultation of Kathleen Odonnell recurrent headaches and dizziness. The patient is unaccompanied today. I first met Kathleen Odonnell on 12/03/2015 as a emergency room referral. She reported a history of bilateral trigeminal neuralgia which improved after Kathleen Odonnell procedures. She had stopped taking gabapentin in the past. I ordered a brain MRI but it was denied by Kathleen Odonnell insurance. I suggested we proceed with clinical monitoring as Kathleen Odonnell exam was non-focal, and she was advised to continue gabapentin 100 mg tid.   Today, 02/23/2016 (all dictated new, as well as above notes, some dictation done in note pad or Word, outside of chart, may appear as copied):   She reports doing a little better as far as the gabapentin helping, it does help to the point where she can function, she denies any significant side effects. She takes 100 mg in the morning and 100 mg at 6 PM and 100 mg at bedtime. She tried taking the medication with Kathleen Odonnell to work but she would get the bottle at work. She has not had any new symptoms. She feels that stress and loud noises are trigger for Kathleen Odonnell headaches to get worse. She does snore, particularly when she is congested Kathleen Odonnell sick. Kathleen Odonnell husband has not reported any breathing pauses while she is asleep. She does get up to use the bathroom at night. She tries to hydrate well.  The patient's allergies, current medications, family history, past medical history, past social history, past surgical history and problem list were reviewed and updated as appropriate.   Previously (copied from previous notes for reference):   12/03/2015: She presented to the  emergency room on 12/01/2015 with headache and dizziness. The patient is unaccompanied today. I reviewed the emergency room records. She had pain in the left occipital area. She had associated dizziness which occurred with sudden change in position, especially standing from the seated position. She had also felt a swelling in the area of pain. She was treated symptomatically with an occipital block and was given an RX for gabapentin, 100 mg tid, which she started. She feels mildly sedated from it, but not enough to impair driving. She was also given a Rx for hydrocodone in the ER and took 1 pill thus far, but Kathleen Odonnell dizziness became worse.  She had another bout of pain later that evening when she went home from the emergency room, pain is short-lived, usually a few minutes at a time, sharp, stabbing, and the left occipital area radiating forward, she has had intermittent lightheadedness, denies any visual aura, no nausea, no vomiting, no photophobia or sonophobia. She has had over the past years intermittent right facial pain, resembling Kathleen Odonnell trigeminal neuralgia pain but overall has had no significant problems since Kathleen Odonnell bilateral surgeries. She has no personal history of migraine headaches. She does endorse increase in stress in the past year. 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 She lives at home with Kathleen Odonnell family, husband and 45-year-old son, as well as an 87 year old daughter. She is a nonsmoker and drinks alcohol very infrequently. She tries to hydrate with water. She has been exercising and  has been able to lose weight but since school started she has gained some weight back. She has a history of mild intermittent snoring, worse with congestion but no history of breathing pauses while asleep or gasping sensations while asleep. Husband has obstructive sleep apnea and uses a CPAP machine. She is familiar with the diagnosis of OSA. She currently feels mildly lightheaded,  denies any vertigo type symptoms, no nausea. Had an eye exam in the past 3 months or so and also a dental exam.    Kathleen Odonnell Past Medical History Is Significant For: Past Medical History:  Diagnosis Date  . Seasonal allergies   . Trigeminal neuralgia     Kathleen Odonnell Past Surgical History Is Significant For: Past Surgical History:  Procedure Laterality Date  . CEREBRAL MICROVASCULAR DECOMPRESSION    . CESAREAN SECTION    . TUBAL LIGATION      Kathleen Odonnell Family History Is Significant For: No family history on file.  Kathleen Odonnell Social History Is Significant For: Social History   Social History  . Marital status: Married    Spouse name: N/A  . Number of children: 2  . Years of education: Masters   Social History Main Topics  . Smoking status: Never Smoker  . Smokeless tobacco: Never Used  . Alcohol use Yes     Comment: occ  . Drug use: No  . Sexual activity: Not Asked   Other Topics Concern  . None   Social History Narrative   Drinks 1-2 caffeine drinks a day     Kathleen Odonnell Allergies Are:  Allergies  Allergen Reactions  . Carbamazepine Hives  :   Kathleen Odonnell Current Medications Are:  Outpatient Encounter Prescriptions as of 02/23/2016  Medication Sig  . Aspirin-Salicylamide-Caffeine (BC HEADACHE POWDER PO) Take by mouth.  [name] BLACK COHOSH PO Take by mouth.  . gabapentin (NEURONTIN) 100 MG capsule Take 1 capsule (100 mg total) by mouth 3 (three) times daily.  [name] loratadine (CLARITIN) 10 MG tablet Take 10 mg by mouth daily.  . Multiple Vitamin (MULTIVITAMIN) capsule Take 1 capsule by mouth daily.  . polycarbophil (FIBERCON) 625 MG tablet Take 625 mg by mouth daily.  . [DISCONTINUED] oxyCODONE-acetaminophen (PERCOCET/ROXICET) 5-325 MG tablet Take 1 tablet by mouth every 4 (four) hours as needed for severe pain.   No facility-administered encounter medications on file as of 02/23/2016.   :  Review of Systems:  Out of a complete 14 point review of systems, all are reviewed and negative with the exception of  these symptoms as listed below:  Review of Systems  Neurological:       Pt presents today to discuss Kathleen Odonnell headaches. Pt's MRI was denied by insurance. Pt has severe headaches with loud noises.    Objective:  Neurologic Exam  Physical Exam Physical Examination:   Vitals:   02/23/16 1529  BP: 98/64  Pulse: 72  Resp: 20    General Examination: The patient is a very pleasant 46 y.o. female in no acute distress. She appears well-developed and well-nourished and well groomed. Good sprits today.   HEENT: Normocephalic, atraumatic, pupils are equal, round and reactive to light and accommodation. Extraocular tracking is good without limitation to gaze excursion or nystagmus noted. Normal smooth pursuit is noted. Hearing is grossly intact. Face is symmetric with normal facial animation and normal facial sensation. Speech is clear with no dysarthria noted. There is no hypophonia. There is no lip, neck/head, jaw or voice tremor. Neck is supple with full range of passive and active  motion. There are no carotid bruits on auscultation. Oropharynx exam reveals: mild mouth dryness, good dental hygiene and mild airway crowding. Mallampati is class II. Tongue protrudes centrally and palate elevates symmetrically. Tonsils are 1+ in size.    Chest: Clear to auscultation without wheezing, rhonchi or crackles noted.  Heart: S1+S2+0, regular and normal without murmurs, rubs or gallops noted.   Abdomen: Soft, non-tender and non-distended with normal bowel sounds appreciated on auscultation.  Extremities: There is no pitting edema in the distal lower extremities bilaterally. Pedal pulses are intact.  Skin: Warm and dry without trophic changes noted.   Musculoskeletal: exam reveals no obvious joint deformities, tenderness or joint swelling or erythema.   Neurologically:  Mental status: The patient is awake, alert and oriented in all 4 spheres. Kathleen Odonnell immediate and remote memory, attention, language skills and  fund of knowledge are appropriate. There is no evidence of aphasia, agnosia, apraxia or anomia. Speech is clear with normal prosody and enunciation. Thought process is linear. Mood is normal and affect is normal.  Cranial nerves II - XII are as described above under HEENT exam. In addition: shoulder shrug is normal with equal shoulder height noted. Motor exam: Normal bulk, strength and tone is noted. There is no drift, tremor or rebound. Romberg is negative. Reflexes are 2+ throughout. Fine motor skills and coordination: intact with normal finger taps, normal hand movements, normal rapid alternating patting, normal foot taps and normal foot agility.  Cerebellar testing: No dysmetria or intention tremor on finger to nose testing. Heel to shin is unremarkable bilaterally. There is no truncal or gait ataxia.  Sensory exam: intact to light touch and temp in the upper and lower extremities.  Gait, station and balance: She stands easily. No veering to one side is noted. No leaning to one side is noted. Posture is age-appropriate and stance is narrow based. Gait shows normal stride length and normal pace. No problems turning are noted. Tandem walk is unremarkable.         Assessment and Plan:   In summary, JAMARIE JOPLIN is a very pleasant 46 y.o.-year old female with an underlying medical history of bilateral trigeminal neuralgia, status post microvascular decompression surgeries some years ago, obesity, and allergies, who presents for follow-up consultation of Kathleen Odonnell recurrent occipital headaches. She has been on gabapentin 300 mg total dose but has not been able to maintain a 3 times a day schedule because of Kathleen Odonnell work schedule most typically. Nevertheless, she has noted improvement. Kathleen Odonnell physical exam and neurological exam are nonfocal. Kathleen Odonnell insurance had denied an MRI and we will continue to monitor Kathleen Odonnell clinically. I suggested we increase Kathleen Odonnell gabapentin gradually to 200 mg twice daily for the next week or 2  and then 300 mg twice daily thereafter. I adjusted Kathleen Odonnell prescription for this. So far, she has not had any side effects and she had taken it in the past successfully. In addition, I would like to proceed with a sleep study to rule out obstructive sleep apnea as a contributor to Kathleen Odonnell recurrent headaches given Kathleen Odonnell airway exam and history of recurrent headaches as well as obesity. She is agreeable. To that end, we will call Kathleen Odonnell to schedule Kathleen Odonnell sleep study. I will see Kathleen Odonnell back after that. I answered all Kathleen Odonnell questions today and she was in agreement with the plan.  I spent 25 minutes in total face-to-face time with the patient, more than 50% of which was spent in counseling and coordination of care, reviewing test results,  reviewing medication and discussing or reviewing the diagnosis of recurrent HAs, the prognosis and treatment options. Pertinent laboratory and imaging test results that were available during this visit with the patient were reviewed by me and considered in my medical decision making (see chart for details).

## 2016-02-23 NOTE — Patient Instructions (Addendum)
wI would like to pursue sleep study testing for rule out sleep apnea.   Your exam is non focal.   Let's gradually increase your gabapentin to 200 mg 2 times (we will skip the midday dose) and after a week (or 2), you can increase to 300 mg (3 pills) 2 times a day thereafter.

## 2016-04-03 ENCOUNTER — Other Ambulatory Visit: Payer: Self-pay | Admitting: Neurology

## 2016-04-03 DIAGNOSIS — R51 Headache: Principal | ICD-10-CM

## 2016-04-03 DIAGNOSIS — R519 Headache, unspecified: Secondary | ICD-10-CM

## 2016-04-03 DIAGNOSIS — Z8669 Personal history of other diseases of the nervous system and sense organs: Secondary | ICD-10-CM

## 2016-07-12 ENCOUNTER — Other Ambulatory Visit: Payer: Self-pay | Admitting: Family Medicine

## 2016-07-12 DIAGNOSIS — Z1231 Encounter for screening mammogram for malignant neoplasm of breast: Secondary | ICD-10-CM

## 2016-07-15 ENCOUNTER — Encounter (INDEPENDENT_AMBULATORY_CARE_PROVIDER_SITE_OTHER): Payer: Self-pay

## 2016-07-15 ENCOUNTER — Ambulatory Visit
Admission: RE | Admit: 2016-07-15 | Discharge: 2016-07-15 | Disposition: A | Discharge status: Home / Self Care | Payer: BC Managed Care – PPO | Source: Ambulatory Visit | Attending: Family Medicine | Admitting: Family Medicine

## 2016-07-15 DIAGNOSIS — Z1231 Encounter for screening mammogram for malignant neoplasm of breast: Secondary | ICD-10-CM

## 2017-08-14 ENCOUNTER — Other Ambulatory Visit: Payer: Self-pay | Admitting: Family Medicine

## 2017-08-14 DIAGNOSIS — Z1231 Encounter for screening mammogram for malignant neoplasm of breast: Secondary | ICD-10-CM

## 2017-08-15 ENCOUNTER — Ambulatory Visit: Payer: Self-pay

## 2017-09-11 ENCOUNTER — Ambulatory Visit
Admission: RE | Admit: 2017-09-11 | Discharge: 2017-09-11 | Disposition: A | Discharge status: Home / Self Care | Payer: BC Managed Care – PPO | Source: Ambulatory Visit | Attending: Family Medicine | Admitting: Family Medicine

## 2017-09-11 DIAGNOSIS — Z1231 Encounter for screening mammogram for malignant neoplasm of breast: Secondary | ICD-10-CM

## 2017-10-09 ENCOUNTER — Other Ambulatory Visit (HOSPITAL_COMMUNITY): Payer: BC Managed Care – PPO | Attending: Psychiatry | Admitting: Licensed Clinical Social Worker

## 2017-10-09 ENCOUNTER — Encounter (HOSPITAL_COMMUNITY): Payer: Self-pay | Admitting: Psychiatry

## 2017-10-09 DIAGNOSIS — F4310 Post-traumatic stress disorder, unspecified: Secondary | ICD-10-CM | POA: Insufficient documentation

## 2017-10-09 DIAGNOSIS — F339 Major depressive disorder, recurrent, unspecified: Secondary | ICD-10-CM

## 2017-10-09 DIAGNOSIS — F331 Major depressive disorder, recurrent, moderate: Secondary | ICD-10-CM | POA: Insufficient documentation

## 2017-10-09 DIAGNOSIS — Z7982 Long term (current) use of aspirin: Secondary | ICD-10-CM | POA: Insufficient documentation

## 2017-10-09 DIAGNOSIS — Z79899 Other long term (current) drug therapy: Secondary | ICD-10-CM | POA: Insufficient documentation

## 2017-10-09 NOTE — Progress Notes (Signed)
    Daily Group Progress Note  Program: IOP  Group Time: 9am-12pm  Participation Level: Active  Behavioral Response: Appropriate  Type of Therapy:  Group Therapy; psycho-educational group, process group  Summary of Progress:  Clinician checked in with clients, assessing for SI/HI/psychosis, overall level of functioning.  9am-10:30am Pharmacist. Clients allowed time to ask questions related to medications and any possible side effects. Pharmacist helped identify which symptoms can be helped with medications and which symptoms would benefit being addressed with therapy skills.  10:30am-12pm Clinician presented the topic of vulnerability. Clinician provided clients with Who Are You activity focused on consciously acknowledging and accepting core self, becoming more mindful and self-aware. Clinician presented video on the role vulnerability plays in therapy and daily relationships. Clinician facilitated discussion on the pros and cons of vulnerability and being open to uncomfortable feelings and situations. Clinician requested client's take Who Are You activity home and re-complete it with a focus on being vulnerable and what they are willing for that to look like. Clinician presented this day fully oriented with mood and affect WNL. Client participated in discussions and activities. Client verbalized vulnerability as weakness and something to be avoided. Client notes positive support from her husband.  Harlon Ditty, LCSW
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
## 2017-10-10 ENCOUNTER — Other Ambulatory Visit (HOSPITAL_COMMUNITY): Payer: BC Managed Care – PPO | Admitting: Licensed Clinical Social Worker

## 2017-10-10 DIAGNOSIS — F331 Major depressive disorder, recurrent, moderate: Secondary | ICD-10-CM

## 2017-10-10 MED ORDER — CITALOPRAM HYDROBROMIDE 40 MG PO TABS: 40.0000 mg | ORAL_TABLET | Freq: Every day | ORAL | 0 refills | Status: DC

## 2017-10-10 NOTE — Progress Notes (Signed)
    Daily Group Progress Note  Program: IOP  Group Time: 9am-12pm  Participation Level: Active  Behavioral Response: Appropriate  Type of Therapy:  Group Therapy; psychoeducational group, process group  Summary of Progress:  The purpose of this group is to utilize CBT and BT skills in a group setting to increase utilization of healthy coping skills and decrease intensity of active mental health symptoms.  9am-10:30am Continued conversation on importance of vulnerability and barriers.  Presented the topic of Automatic Negative Thoughts (ANTs). Clinician and group members discussed common automatic negative thoughts as well as 'Squashing' thoughts. Clinician and group members reviewed questions to help challenge automatic thoughts.  10:30am-12pm Clinician presented Distress Tolerance Skills STOP and ACCEPTS. Clinician facilitated discussion on when each skill could be helpful and barriers and solutions to using skill. Clinician praised group members engagement in conversation and problem solving among the group.  Clinician facilitated Box Breathing exercise and provided psychoeducation on it's helpfulness for situations with high anxiety. Clinician reminded clients of the importance of gratefulness as a mindfulness activity and challenged group members to identify 3 things for which they are grateful. Client shared in group she 'stuffs' her feelings. Client identified uncomfortable feelings related to several recent losses and she copes by attempting to help others rather than focus on her own feelings. Client identified increasing panic attacks, including before walking into school for work. Client reports she is unsure if breathing skill was helpful, but did make her dizzy. Client participated fully in all group discussions and activities and was supportive of other group members.   Harlon Ditty, LCSW
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
## 2017-10-11 ENCOUNTER — Other Ambulatory Visit (HOSPITAL_COMMUNITY): Payer: BC Managed Care – PPO | Admitting: Licensed Clinical Social Worker

## 2017-10-11 DIAGNOSIS — F331 Major depressive disorder, recurrent, moderate: Secondary | ICD-10-CM | POA: Diagnosis not present

## 2017-10-11 NOTE — Progress Notes (Signed)
    Daily Group Progress Note  Program: IOP  Group Time: 9am-12pm  Participation Level: Active  Behavioral Response: Appropriate, Sharing and Care-Taking  Type of Therapy:  Group Therapy  Summary of Progress:  The purpose of this group is to utilize CBT and DBT skills in a group setting to increase use of effective coping skills and decrease frequency and intensity of active mental health symptoms.  9am-10:30am Clinician provided psychoeducation on breathing exercises and progressive muscle relaxation as techniques for managing symptoms of anxiety. Clinician and group members participated in several types of breathing activities. Clinician and group members participated in progressive muscle relaxation sequence.  10:30am-12pm Clinician and group members completed ' My Mission Celebrating Life and Love' activity. Clinician facilitated discussion on thoughts and feelings related to activity. Clinician reminded clients of the cognitive triangle and the connection between thoughts, emotions, and behaviors. Clinician and group members discussed what physical sensations come with different levels of anxiety. Clinician challenged group members to start scanning themselves to identify when they are at a lower level of anxiety and can implement a skill vs being too anxious to remember a skill. Clinician challenged clients to create more realistic or helpful thoughts when cognitive distortions were verbalized.  Clinician inquired about self care activity planned for the day. Client participated in group activities and discussions. Client was supportive of other group members and helped problem solve. Client identified she is good at helping others but not at implementing skills for herself. Client notes she does not recognize herself when anxiety at a 4-5, only nothing or 10 at which time she cannot remember to use a skill. Client agrees to practice body scanning and focusing on identifying physical  reactions to mild and moderate anxiety. Client completed mission worksheet but reports she often struggles to believe positive things about herself even when others identify them. Clinician discussed with client 'vulnerability hangover' which client identified with after the past 2 days of assessments and therapy.   Harlon Ditty, LCSW

## 2017-10-12 ENCOUNTER — Other Ambulatory Visit (HOSPITAL_COMMUNITY): Payer: BC Managed Care – PPO | Admitting: Licensed Clinical Social Worker

## 2017-10-12 DIAGNOSIS — F331 Major depressive disorder, recurrent, moderate: Secondary | ICD-10-CM

## 2017-10-12 NOTE — Progress Notes (Signed)
    Daily Group Progress Note  Program: IOP  Group Time: 9am-12pm  Participation Level: Active  Behavioral Response: Appropriate  Type of Therapy:  Group Therapy  Summary of Progress:  Clinician checked in with group members assessing for SI/HI/psychosis.  9am-10:30am Chaplin presented the topic of grief and loss. Clients processed types of losses related to changes with family, health, and employment. [facility] [place] provided information related to local community support groups.  10:30am-12pm Clinician reviewed CBT triangle with group members and the connection between thoughts, emotions, and behaviors. Clinician and group members processed changing negative self talk using THINK skill. Clinician provided examples and encouraged clients to provide examples of common unhelpful thoughts or thoughts that create uncomfortable emotions and work on creating more helpful thoughts. Clinician noted the THINK skill is helpful for ruminating thoughts. Clinician challenged clients to focus on body scanning when identifying emotions and level of intensity in order to identify changes in the body related to changes in feelings intensity. Clinician and group members practiced identifying specific thoughts, behaviors and creating alternate thoughts. Clinician challenged clients to identify differences in thoughts and feelings. Clinician provided clients with Gottman's Repair Checklist focused on helpful phrases for effective communication. Clinician encouraged clients to utilize phrases in daily life, when not upset, in order to become more comfortable responding, rather than reacting, to a situation. Clinician reminded clients, similar to other coping skills, practicing effective communication in calm situations helps build mastery.  Clinician facilitated practice of 5-4-3-2-1 Grounding Technique as a skill to try over the next several days. Client engaged with chaplin about the loss of her family  members and feeling as if she had to act strong rather than process grief. Client reports this is what was modeled to her growing up. Client notes it is been overwhelming 'feeling her feelings' and addressing unhelpful thoughts related to the feelings. Client notes she does not like to feel weak or out of control, which is why she prefers to help others rather than address her own feelings. Client notes music is a helpful distraction tool.  Harlon Ditty, LCSW

## 2017-10-13 ENCOUNTER — Other Ambulatory Visit (HOSPITAL_COMMUNITY): Payer: BC Managed Care – PPO | Admitting: Licensed Clinical Social Worker

## 2017-10-13 ENCOUNTER — Encounter (HOSPITAL_COMMUNITY): Payer: Self-pay

## 2017-10-13 DIAGNOSIS — F331 Major depressive disorder, recurrent, moderate: Secondary | ICD-10-CM | POA: Diagnosis not present

## 2017-10-13 MED ORDER — HYDROXYZINE PAMOATE 25 MG PO CAPS: 25.0000 mg | ORAL_CAPSULE | Freq: Three times a day (TID) | ORAL | 0 refills | Status: DC | PRN

## 2017-10-13 NOTE — Progress Notes (Signed)
N.P met with patient regarding reporting worsening anxiety symptoms.  Patient reports she feels her anxiety is getting worse, Reports especially during group session as she reports she feels uncomfortable disclosing past event and issues. " I do not like to unpack in my suitcase in front of everybody".  Reports she continues to work on coping skills however still feels anxious, irritable and tearful.  States she she has been recently grinding her teeth to help relieve some of anxiety.  Patient is prescribed Klonopin 0.5 as needed.  Reports she is unable to take this medication during the day as she reports excessive sedation.  Discussed initiating hydroxyzine 25 mg p.o. 3 times daily as needed for anxiety symptoms.  Patient was agreeable to treatment plan.   Continue intensive outpatient programing  Start hydroxyzine 25 mg p.o. 3 times daily Continue Celexa 40 mg p.o. Daily.  Treatment plan was reviewed and agreed upon by NP. T.Lewis and patient Clearnce Sorrel need for continued group services.

## 2017-10-16 ENCOUNTER — Other Ambulatory Visit (HOSPITAL_COMMUNITY): Payer: BC Managed Care – PPO | Admitting: Licensed Clinical Social Worker

## 2017-10-16 DIAGNOSIS — F331 Major depressive disorder, recurrent, moderate: Secondary | ICD-10-CM
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
## 2017-10-16 NOTE — Progress Notes (Signed)
    Daily Group Progress Note  Program: IOP  Group Time: 9am-12pm  Participation Level: Active  Behavioral Response: Appropriate and Resistant  Type of Therapy:  Group Therapy; psycho-educational group, process group  Summary of Progress:  The purpose of this group is to utilize CBT and DBT skills in a group setting to increase knowledge and utilization of healthy coping skills and decrease active mental health symptoms.  9am-10:30Clinician checked in with group assessing for SI/HI/psychosis, and overall level of functioning. clinician inquired about completed self care activities from the previous day.  Clinician presented the topic of anger as a secondary emotion. Clinician facilitated discussion onho we express anger and how that was learned, and physical behaviors based on intensity. Clinician provided mindfulness activity, creating a FIT, while disuccing triggers for anyger and other emotions expressed as anger. Clinician and group memebers processed why they use a 'mask' for feelings rather than expressing them in the moment. Clinician noted when emotions are not coped with individually they become more intense. Clinician facilitated guided meditation and presented box breathing as options for coping with overwhelming emotions thoughout the week. Clinician encouraged clients to practice skills while in safe, calm situations in order to build mastery of skills.  10:30-12 Clinician reviewed with clients the cognitive triangle and connection between thoughts, emotions, and behaviors. Clinician provided information on the effectiveness of identifying specific thoughts and feelings in order to create a more realistic or helpful thought which causes a new feeling and behavior response. Clinician presented the worksheet '25 Characteristics of Unresolved Dependency Issues.' Clinician facilitated discussion on the importance of identifying our own feelings rather than concerns about what others are  thinking, of which we have no control.  Clinician requested clients identify a self care activity to complete over the weekend. Client reports it is difficult to 'unpack' past events and emotions which come with those. Client identified it is often easier to show anger in order for others to not ask questions. Client notes she appreciates her co-workers caring but has anxiety related to answering questions on how she is doing. Client states she does not notice a change in her body related to intense emotions, reporting it is baseline (4-5) or intense (10). Client was encouraged to work on body scanning multiple times throughout the day to help notice physical symptoms of uncomfortable emotions at different intensity.  Harlon Ditty, LCSW

## 2017-10-17 ENCOUNTER — Other Ambulatory Visit (HOSPITAL_COMMUNITY): Payer: BC Managed Care – PPO | Admitting: Licensed Clinical Social Worker

## 2017-10-17 DIAGNOSIS — F331 Major depressive disorder, recurrent, moderate: Secondary | ICD-10-CM | POA: Diagnosis not present

## 2017-10-17 NOTE — Progress Notes (Signed)
    Daily Group Progress Note  Program: IOP  Group Time: 9am-12pm  Participation Level: Active  Behavioral Response: Appropriate  Type of Therapy:  Group Therapy; psycho-educational group, process group  Summary of Progress:  The purpose of this group is to learn and utilize CBT and DBT skills in a group setting and implement healthy coping skills in daily life to decrease active mental health symptoms and increase level of functioning.  9am-11am Clinician presented the topic of 'Roadblocks to Healthy Thinking.' Clinician and group members processed types of roadblocks and examples in daily life. Clinician challenged group members to create alternative thoughts in order to change attached emotions. Clinician challenged group members to utilize I-statements in conversations pause to identify specific thoughts and feelings related to behaviors. Clinician validated client feelings and the fact this skill is difficult and takes time. Clinician praised clients team work in creating alternate thoughts.  11am-12pm Chaplin attended group presenting the topic for processing: grief and loss. Chaplin and group members discussed loss of jobs, people, opportunities, possibilities with relationships. Group members discussed several 'what if' situations and what it would look like to let that thought go.  Clinician assessed for SI/HI/psychosis and overall level of functioning. Client participated in group discussions by actively listening and being supportive of other group members. Client assisted group members with re-faming to create more helpful thoughts and identified she is better at encouraging others than she is at implementing tools herself. Client states she will have to go by her work to pick up paperwork and had a plan to get through the visit.   Harlon Ditty, LCSW

## 2017-10-18 ENCOUNTER — Other Ambulatory Visit (HOSPITAL_COMMUNITY): Payer: BC Managed Care – PPO | Admitting: Licensed Clinical Social Worker

## 2017-10-18 DIAGNOSIS — F331 Major depressive disorder, recurrent, moderate: Secondary | ICD-10-CM | POA: Diagnosis not present

## 2017-10-18 NOTE — Progress Notes (Signed)
    Daily Group Progress Note  Program: IOP  Group Time: 9am-12pm  Participation Level: Active  Behavioral Response: Appropriate  Type of Therapy:  Group Therapy; process group, psycho-educational group  Summary of Progress:  The purpose of this group is to utilize CBT and DBT skills in a group setting to increase knowledge and use of healthy coping skills to decrease active mental health symptoms.  9am-11am Clinician provided psycho-educational information on Questions to Find Your Purpose. Clinician and group members processed the importance of "knowing your why" in relation to motivation and sense of purpose in life. Clinician facilitated discussion with group members related to identifying your talents, values, passions, and skills in order to intersect and identify purpose in life. Clinician prompted clients to discuss their innate strengths, what value they add to the world, what makes them feel alive, and how will they measure their life. Clinician facilitated discussion related to measuring a meaningful life and how priorities and values affect how/what one would consider successful.  11am-12pm Staff from [facility] provided psycho-education related to improving mental health through nutrition, exercise, and sleep hygiene. Clients were encouraged to set small goals in each area to take steps in incorporating new healthy activities into daily life. Client participated in all group discussions. 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  Harlon Ditty, LCSW

## 2017-10-19 ENCOUNTER — Other Ambulatory Visit (HOSPITAL_COMMUNITY): Payer: BC Managed Care – PPO | Admitting: Licensed Clinical Social Worker

## 2017-10-19 DIAGNOSIS — F331 Major depressive disorder, recurrent, moderate: Secondary | ICD-10-CM | POA: Diagnosis not present

## 2017-10-19 NOTE — Progress Notes (Signed)
    Daily Group Progress Note  Program: IOP  Group Time: 9am-12pm  Participation Level: Active  Behavioral Response: Appropriate  Type of Therapy:  Group Therapy; process group, psycho-educational group  Summary of Progress:  The purpose of this group is to utilize CBT and DBT skills in a group setting to increase utilization of healthy coping skills and decrease intensity of active mental health symptoms.  9am-11am Clinician presented the topic of "The Four Aspects of Self." Clinician and group members discussed what wellness looks like in each area. Clinician inquired which areas were difficult for each client and brainstormed ways to take small steps toward improvement and healing. Clinician and group members processed ways of creatively adding small moments of self care into daily life, such as stretching while brushing teeth or using mindfulness while washing dishes. Clinician challenged clients to identify thoughts and feelings and utilize I-statements to get needs met as well as focus on creating alternative thoughts to decrease intensity of emotions. Clinician and group members discussed journaling as an option to get thoughts and feelings out without feeling they are burdening others. Clinician provided clients with mindfulness activity focused on breathing and changing clasping of hands.  11am-12pm Yoga and mindfulness meditation activity was provided for group members, lead by co-facilitator.  Client verbalized sharing her feelings more often with her husband recently. Client continues to use distraction skills to manage anxiety. Client provided support for other group members and identified that she often does not follow her own advice. Client was able to utilize I-statement in group and was provided praise by group leader and group members.   Olegario Messier, LCSW

## 2017-10-20 ENCOUNTER — Other Ambulatory Visit (HOSPITAL_COMMUNITY): Payer: BC Managed Care – PPO | Admitting: Licensed Clinical Social Worker

## 2017-10-20 DIAGNOSIS — F331 Major depressive disorder, recurrent, moderate: Secondary | ICD-10-CM

## 2017-10-23 ENCOUNTER — Other Ambulatory Visit (HOSPITAL_COMMUNITY): Payer: BC Managed Care – PPO | Admitting: Licensed Clinical Social Worker

## 2017-10-23 DIAGNOSIS — F331 Major depressive disorder, recurrent, moderate: Secondary | ICD-10-CM

## 2017-10-23 NOTE — Progress Notes (Signed)
    Daily Group Progress Note  Program: IOP  Group Time: 9am-12pm  Participation Level: Active  Behavioral Response: Appropriate  Type of Therapy:  Group Therapy; psycho-educational, process  Summary of Progress:  The purpose of this group is to utilize CBT and DBT skills in a group setting to increase knowledge and utilization of healthy coping skills to decrease active mental health symptoms. 9am-10:30am Clinician met with group, assessing for SI/HI/psychosis/overall level of functioning. Clinician presented the topic of Accepting Difficult Emotions and Self Compassion. Clinician and group members reviewed stages of acceptance and skills and positive self talk which could be used at each stage. Clinician and group members processed the importance of self compassion including self kindness, common humanity, and mindfulness. Clinician and group members reviewed previously learned mindfulness skills and discussed how these can be added into daily life. Clinician encouraged group members to build mastery of skills by practicing incorporating skills into less intense situations. Clinician presented Ruminating Though Worksheet as well as How To Stop Ruminating, a mindful meditation exercise. Clinician noted that a benefit of journaling is the ability to go back, identify unhealthy thoughts, cross out and create a more healthy thought.  10:30am-12pm Clinician presented Self-Care Action Plan and encouraged clients to identify small goals and steps they can make toward each recommended area. Clinician prompted clients to utilize skills and information provided earlier in the week from [facility] Staff such as being more mindful of our current movements, rather than adding extra tasks into the day. Clinician also provided information on Sleep hygiene and some psychoeducation related to the effect of substance use on the body. Client participated fully in all group activities and discussions. Client was  supportive of other group members and recemptive to feedback and challenges from clinician and other group members.  Olegario Messier, LCSW

## 2017-10-24 ENCOUNTER — Other Ambulatory Visit (HOSPITAL_COMMUNITY): Payer: BC Managed Care – PPO | Admitting: Licensed Clinical Social Worker

## 2017-10-24 DIAGNOSIS — F331 Major depressive disorder, recurrent, moderate: Secondary | ICD-10-CM

## 2017-10-24 NOTE — Progress Notes (Signed)
    Daily Group Progress Note  Program: IOP  Group Time: 9am-12pm  Participation Level: Active  Behavioral Response: Appropriate  Type of Therapy:  Group Therapy; psycho-educational group, process group  Summary of Progress:  9am-10:30am Clinician checked in with group members. Pharmacist was available in group session for questions or concerns related to medication and side effects. Pharmacist provided psycho-educational information about classes of medications, uses, and side effects. Pharmacist provided education on medication responses in relation to drugs and alcohol.  10:30am-12pm Clinician presented the topic of neuroplasticity with video from Dr. Tonny Bollman. Clinician and group members processed ways to add in growth mindset and hold onto joyful moments. Clinician presented the DBT skill of Wise Mind. Clinician facilitated discussion on differences in rational and emotional mind, and how this effects emotions and behaviors. Clinician and group members also discussed WHAT Skills, practicing observing, describing, and participated. Clinician facilitated processing with clients about making plans and gaining mastery of at least one helpful skill before returning to work.  Client participated fully in group discussions and activities. Client was receptive to feed back from group members and challenges from group clinician.  Harlon Ditty, LCSW

## 2017-10-25 ENCOUNTER — Other Ambulatory Visit (HOSPITAL_COMMUNITY): Payer: BC Managed Care – PPO | Admitting: Licensed Clinical Social Worker

## 2017-10-25 DIAGNOSIS — F331 Major depressive disorder, recurrent, moderate: Secondary | ICD-10-CM | POA: Diagnosis not present

## 2017-10-25 NOTE — Progress Notes (Signed)
    Daily Group Progress Note  Program: IOP  Group Time: 9am-12pm  Participation Level: Active  Behavioral Response: Appropriate, Sharing and Care-Taking  Type of Therapy:  Group Therapy; process group, psycho-educational group  Summary of Progress:  The purpose of this group is to utilize CBT and DBT skills in a group setting to increase utilization of healthy coping skills and decrease active mental health symptoms.  9am-10am Chaplin facilitated discussion related to grief and loss. Clients processed loss of family members and relationships that were unable to be formed. Chaplin, clinician and group members processed loss of 'what ifs' with relationships that were not what one hoped or expected they would be. Clinician and group members continued conversation on relationship expectations and thoughts and feelings related to outcomes not being expected. Group members also discussed thoughts and feelings when others expectations of situations do not match their own.  10am-12pm Clinician presented the topic of forgiveness. Clinician and group members discussed what forgiveness is and is not. Clinician requested clients complete Forgiveness IQ review. Clinician and group members discussed what they were holding onto and what they are willing to let go to improve mental and behavioral health. Clinician and group members processed the benefit, if any, of holding onto the unhelpful thought and feeling and what might they need in order to be comfortable with forgiveness.  Clinician provided each group member with several Mindfulness practice cards, and requested each client teach a skill to the group. Clinician encouraged participation in activities in order to find a skill which they find helpful and believe they can use in daily life. Client reports continued concern about stress from work when she returns. Client reports she was frustrated with herself yesterday for knowing skills but not being able  to implement them when needed. Client was supportive of other group members and receptive to feedback from clinician.   Harlon Ditty, LCSW

## 2017-10-25 NOTE — Progress Notes (Signed)
    Daily Group Progress Note  Program: IOP  Group Time: 9am-12pm  Participation Level: Active  Behavioral Response: Appropriate  Type of Therapy:  Group Therapy; process group, psycho-educational group  Summary of Progress:  The purpose of this group is to educate and facilitate use of CBT and DBT skills in a group setting to increase utilization of healthy coping skills and decrease active mental health symptoms.  9am-10:30am Co-facilitator led discussion using 'Worry Cards' and discussed types of skills which could be use. Clinician prompted clients to share types of skills they have successfully or unsuccessfully attempted. Clinician reminded clients of the importance of building mastery of skills in order to make them easier to use in a stressful moment. Clinician and group members discussed options for self-soothing, distraction, opposite action, emotional awareness, mindfulness, and crisis planning.  10:30am -12pm Clinician presented clients with 'Who Are You?' exercise focused on identifying core desires and acknowledging and acceptance of self. Clinician inquired which parents were easy to complete, and which prompts were more difficult and why.  Clinician requested clients share a self care activity they would complete before next group. Client participated in group discussions. Client was receptive to challenges from group leader and was able to utilize assertive communication skills in group setting. Client stated her preference for dealing with uncomfortable situations remains avoidance but identifies that is not always helpful. Client was open to the option of setting a time limit on skills or conversation pauses and returning to stop complete avoidance of a situation.  Harlon Ditty, LCSW

## 2017-10-26 ENCOUNTER — Other Ambulatory Visit (HOSPITAL_COMMUNITY): Payer: BC Managed Care – PPO | Admitting: Licensed Clinical Social Worker

## 2017-10-26 DIAGNOSIS — F331 Major depressive disorder, recurrent, moderate: Secondary | ICD-10-CM | POA: Diagnosis not present

## 2017-10-27 ENCOUNTER — Encounter (HOSPITAL_COMMUNITY): Payer: Self-pay | Admitting: Family

## 2017-10-27 ENCOUNTER — Other Ambulatory Visit (HOSPITAL_COMMUNITY): Payer: BC Managed Care – PPO | Admitting: Licensed Clinical Social Worker

## 2017-10-27 DIAGNOSIS — F331 Major depressive disorder, recurrent, moderate: Secondary | ICD-10-CM | POA: Diagnosis not present

## 2017-10-27 MED ORDER — TRAZODONE HCL 100 MG PO TABS: 100.0000 mg | ORAL_TABLET | Freq: Every day | ORAL | 1 refills | Status: DC
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
## 2017-10-27 NOTE — Patient Instructions (Signed)
D:  Patient completed MH-IOP today.  A:  Follow up with Dr. Lolly Mustache on 12-18-17 @ 9 a.m and pt will call [name], [facility] for an appt.  Encouraged support groups.  Recommend [facility].  RTW on 10-30-17 without any restrictions.  R:  Pt receptive.
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
## 2017-10-30 ENCOUNTER — Other Ambulatory Visit (HOSPITAL_COMMUNITY): Payer: BC Managed Care – PPO

## 2017-10-30 NOTE — Progress Notes (Signed)
    Daily Group Progress Note  Program: IOP  Group Time: 9am-12pm  Participation Level: Active  Behavioral Response: Appropriate  Type of Therapy:  Group Therapy; psycho-educational group, process group  Summary of Progress:  9:00 to 10:30 - The purpose of group today was to educate and encourage the group on the utilization of CBT and DBT for healthy coping skills. After clinician checked in with each member for SI/HI (suicidal/homicidal ideations) or psychosis, the co-facilitator conducted discussion on 'forgiveness activity,' which covered forgiveness of self, show yourself grace and show yourself patience. Clinician prompted clients to reflect on experiences with forgiveness and their views from a personal standpoint. Clinician and clients explored their beliefs and shared feedbacks through active listening and participation. Clinician reiterate the importance of utilizing positive coping skills to acknowledge the good in forgiveness. Focusing on forgiving others, not because they deserve forgiveness, but because you deserve peace. Clinician introduced the 'five senses' breathing exercise to increased awareness of utilization of mindfulness skills to eliminate the unhealthy thoughts and feelings that overwhelming.  10:30 12Noon Clinician provided [employer] as a mindfulness activity to bring awareness to thoughts, values, and beliefs to engage group members in real life discussions. Clients were engaged and participated in sharing meaningful experiences. Clinician reminded and encouraged clients selfcare activity over the rest of the day. Client participated in group discussions and activities. Client verbalized concern still about managing anxiety symptoms at work. Client was able to demonstrate skills which could be used and provided support to other group members.  Harlon Ditty, LCSW

## 2017-10-30 NOTE — Progress Notes (Signed)
    Daily Group Progress Note  Program: IOP  Group Time: 9am-12pm  Participation Level: Active  Behavioral Response: Appropriate, Sharing and Motivated  Type of Therapy:  Group Therapy; psycho-educational group, process group  Summary of Progress:  The purpose of this group is to utilize CBT and DBT skills in a group setting to increase use of healthy coping skills and decrease active mental health symptoms.  9am-10:30am Clinician met with group and assessed for SI/HI/psychosis. Clinician and group members participated in 'Brain Dump' activity with a focus of getting worries out for problem solving, rather than keeping in for rumination. Clinician presented topic of self care. Clinician and group members reviewed Self-Care Assessment Worksheet and discussed effective strategies to maintain self-care. Clinician requested group members make a goal for at least one area of self care for focus over the next week. Clinician and group members processed benefits of and barriers to participating in regular self care activities.  10:30am-12pm Clinician presented follow up to worry cards using Worry Doll activity. Clinician requested group members utilize making the worry doll as an opportunity to practice mindfulness. Clinician re-directed group members to focus on using the 'Turning of the Mind' Skill. Clinician facilitated discussion on completing activity and any difficulties during activity. Clinician praised group members participation.  Client completes IOP on this day. Client notes she continues to try implementing skills such as I-statments, breathing, and 5 senses activity. 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  Olegario Messier, LCSW

## 2017-10-31 ENCOUNTER — Other Ambulatory Visit (HOSPITAL_COMMUNITY): Payer: BC Managed Care – PPO

## 2017-11-01 ENCOUNTER — Other Ambulatory Visit (HOSPITAL_COMMUNITY): Payer: BC Managed Care – PPO

## 2017-11-02 ENCOUNTER — Other Ambulatory Visit (HOSPITAL_COMMUNITY): Payer: BC Managed Care – PPO

## 2017-11-03 ENCOUNTER — Other Ambulatory Visit (HOSPITAL_COMMUNITY): Payer: BC Managed Care – PPO

## 2017-11-06 ENCOUNTER — Other Ambulatory Visit (HOSPITAL_COMMUNITY): Payer: BC Managed Care – PPO

## 2017-11-07 ENCOUNTER — Other Ambulatory Visit (HOSPITAL_COMMUNITY): Payer: BC Managed Care – PPO

## 2017-11-08 ENCOUNTER — Other Ambulatory Visit (HOSPITAL_COMMUNITY): Payer: BC Managed Care – PPO

## 2017-11-09 ENCOUNTER — Other Ambulatory Visit (HOSPITAL_COMMUNITY): Payer: BC Managed Care – PPO

## 2017-11-10 ENCOUNTER — Other Ambulatory Visit (HOSPITAL_COMMUNITY): Payer: BC Managed Care – PPO

## 2017-11-13 ENCOUNTER — Other Ambulatory Visit (HOSPITAL_COMMUNITY): Payer: BC Managed Care – PPO

## 2017-11-14 ENCOUNTER — Other Ambulatory Visit (HOSPITAL_COMMUNITY): Payer: BC Managed Care – PPO

## 2017-11-15 ENCOUNTER — Other Ambulatory Visit (HOSPITAL_COMMUNITY): Payer: BC Managed Care – PPO

## 2017-11-16 ENCOUNTER — Other Ambulatory Visit (HOSPITAL_COMMUNITY): Payer: BC Managed Care – PPO

## 2017-11-17 ENCOUNTER — Other Ambulatory Visit (HOSPITAL_COMMUNITY): Payer: BC Managed Care – PPO

## 2017-11-20 ENCOUNTER — Other Ambulatory Visit (HOSPITAL_COMMUNITY): Payer: BC Managed Care – PPO

## 2017-11-21 ENCOUNTER — Other Ambulatory Visit (HOSPITAL_COMMUNITY): Payer: BC Managed Care – PPO

## 2017-11-22 ENCOUNTER — Other Ambulatory Visit (HOSPITAL_COMMUNITY): Payer: BC Managed Care – PPO

## 2017-11-23 ENCOUNTER — Other Ambulatory Visit (HOSPITAL_COMMUNITY): Payer: BC Managed Care – PPO

## 2017-11-24 ENCOUNTER — Other Ambulatory Visit (HOSPITAL_COMMUNITY): Payer: BC Managed Care – PPO

## 2017-11-27 ENCOUNTER — Other Ambulatory Visit (HOSPITAL_COMMUNITY): Payer: BC Managed Care – PPO

## 2017-11-28 ENCOUNTER — Other Ambulatory Visit (HOSPITAL_COMMUNITY): Payer: BC Managed Care – PPO

## 2017-11-29 ENCOUNTER — Other Ambulatory Visit (HOSPITAL_COMMUNITY): Payer: BC Managed Care – PPO

## 2017-12-01 ENCOUNTER — Other Ambulatory Visit (HOSPITAL_COMMUNITY): Payer: BC Managed Care – PPO

## 2017-12-18 ENCOUNTER — Encounter (HOSPITAL_COMMUNITY): Payer: Self-pay | Admitting: Psychiatry

## 2017-12-18 ENCOUNTER — Ambulatory Visit (HOSPITAL_COMMUNITY): Payer: BC Managed Care – PPO | Admitting: Psychiatry

## 2017-12-18 VITALS — BP 119/76 | HR 70 | Ht 60.0 in | Wt 209.6 lb

## 2017-12-18 DIAGNOSIS — F410 Panic disorder [episodic paroxysmal anxiety] without agoraphobia: Secondary | ICD-10-CM | POA: Diagnosis not present

## 2017-12-18 DIAGNOSIS — F4310 Post-traumatic stress disorder, unspecified: Secondary | ICD-10-CM

## 2017-12-18 DIAGNOSIS — F331 Major depressive disorder, recurrent, moderate: Secondary | ICD-10-CM | POA: Diagnosis not present

## 2017-12-18 MED ORDER — CITALOPRAM HYDROBROMIDE 40 MG PO TABS: 40.0000 mg | ORAL_TABLET | Freq: Every day | ORAL | 1 refills | Status: DC

## 2017-12-18 MED ORDER — HYDROXYZINE PAMOATE 25 MG PO CAPS: 25.0000 mg | ORAL_CAPSULE | Freq: Every day | ORAL | 1 refills | Status: DC | PRN

## 2017-12-18 MED ORDER — TRAZODONE HCL 150 MG PO TABS: 150.0000 mg | ORAL_TABLET | Freq: Every day | ORAL | 1 refills | Status: DC

## 2017-12-18 MED ORDER — CLONAZEPAM 0.5 MG PO TABS: 0.5000 mg | ORAL_TABLET | Freq: Every day | ORAL | 1 refills | Status: DC | PRN
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
## 2018-02-07 ENCOUNTER — Encounter (HOSPITAL_COMMUNITY): Payer: Self-pay | Admitting: Psychiatry

## 2018-02-07 ENCOUNTER — Ambulatory Visit (HOSPITAL_COMMUNITY): Payer: BC Managed Care – PPO | Admitting: Psychiatry

## 2018-02-07 DIAGNOSIS — F410 Panic disorder [episodic paroxysmal anxiety] without agoraphobia: Secondary | ICD-10-CM | POA: Diagnosis not present

## 2018-02-07 DIAGNOSIS — F4310 Post-traumatic stress disorder, unspecified: Secondary | ICD-10-CM | POA: Diagnosis not present

## 2018-02-07 DIAGNOSIS — F331 Major depressive disorder, recurrent, moderate: Secondary | ICD-10-CM | POA: Diagnosis not present

## 2018-02-07 MED ORDER — ARIPIPRAZOLE 5 MG PO TABS: ORAL_TABLET | ORAL | 1 refills | Status: DC

## 2018-02-07 MED ORDER — HYDROXYZINE PAMOATE 25 MG PO CAPS: 25.0000 mg | ORAL_CAPSULE | Freq: Every day | ORAL | 1 refills | Status: DC | PRN

## 2018-02-07 MED ORDER — TRAZODONE HCL 150 MG PO TABS: 150.0000 mg | ORAL_TABLET | Freq: Every day | ORAL | 1 refills | Status: DC

## 2018-02-07 MED ORDER — CITALOPRAM HYDROBROMIDE 40 MG PO TABS: 40.0000 mg | ORAL_TABLET | Freq: Every day | ORAL | 1 refills | Status: DC
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
## 2018-04-09 ENCOUNTER — Ambulatory Visit (HOSPITAL_COMMUNITY): Payer: BC Managed Care – PPO | Admitting: Psychiatry

## 2018-04-09 ENCOUNTER — Other Ambulatory Visit: Payer: Self-pay

## 2018-04-10 ENCOUNTER — Other Ambulatory Visit: Payer: Self-pay

## 2018-04-10 ENCOUNTER — Ambulatory Visit (INDEPENDENT_AMBULATORY_CARE_PROVIDER_SITE_OTHER): Payer: BC Managed Care – PPO | Admitting: Psychiatry

## 2018-04-10 DIAGNOSIS — F4310 Post-traumatic stress disorder, unspecified: Secondary | ICD-10-CM | POA: Diagnosis not present

## 2018-04-10 DIAGNOSIS — F331 Major depressive disorder, recurrent, moderate: Secondary | ICD-10-CM | POA: Diagnosis not present

## 2018-04-10 MED ORDER — CITALOPRAM HYDROBROMIDE 40 MG PO TABS: 40.0000 mg | ORAL_TABLET | Freq: Every day | ORAL | 0 refills | Status: DC

## 2018-04-10 MED ORDER — ARIPIPRAZOLE 5 MG PO TABS: ORAL_TABLET | ORAL | 0 refills | Status: DC

## 2018-04-10 MED ORDER — TRAZODONE HCL 150 MG PO TABS: 150.0000 mg | ORAL_TABLET | Freq: Every day | ORAL | 0 refills | Status: DC
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
## 2018-06-25 ENCOUNTER — Other Ambulatory Visit: Payer: Self-pay | Admitting: Family Medicine

## 2018-06-25 DIAGNOSIS — Z1231 Encounter for screening mammogram for malignant neoplasm of breast: Secondary | ICD-10-CM

## 2018-07-10 ENCOUNTER — Encounter (HOSPITAL_COMMUNITY): Payer: Self-pay | Admitting: Psychiatry

## 2018-07-10 ENCOUNTER — Other Ambulatory Visit: Payer: Self-pay

## 2018-07-10 ENCOUNTER — Ambulatory Visit (INDEPENDENT_AMBULATORY_CARE_PROVIDER_SITE_OTHER): Payer: BC Managed Care – PPO | Admitting: Psychiatry

## 2018-07-10 DIAGNOSIS — F4310 Post-traumatic stress disorder, unspecified: Secondary | ICD-10-CM

## 2018-07-10 DIAGNOSIS — F331 Major depressive disorder, recurrent, moderate: Secondary | ICD-10-CM | POA: Diagnosis not present

## 2018-07-10 MED ORDER — ARIPIPRAZOLE 5 MG PO TABS: ORAL_TABLET | ORAL | 0 refills | Status: DC

## 2018-07-10 MED ORDER — TRAZODONE HCL 150 MG PO TABS: 150.0000 mg | ORAL_TABLET | Freq: Every day | ORAL | 0 refills | Status: DC

## 2018-07-10 MED ORDER — CITALOPRAM HYDROBROMIDE 40 MG PO TABS: 40.0000 mg | ORAL_TABLET | Freq: Every day | ORAL | 0 refills | Status: DC
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
## 2018-09-13 ENCOUNTER — Other Ambulatory Visit: Payer: Self-pay

## 2018-09-13 ENCOUNTER — Ambulatory Visit
Admission: RE | Admit: 2018-09-13 | Discharge: 2018-09-13 | Disposition: A | Discharge status: Home / Self Care | Payer: BC Managed Care – PPO | Source: Ambulatory Visit | Attending: Family Medicine | Admitting: Family Medicine

## 2018-09-13 DIAGNOSIS — Z1231 Encounter for screening mammogram for malignant neoplasm of breast: Secondary | ICD-10-CM

## 2018-10-15 ENCOUNTER — Ambulatory Visit (INDEPENDENT_AMBULATORY_CARE_PROVIDER_SITE_OTHER): Payer: BC Managed Care – PPO | Admitting: Psychiatry

## 2018-10-15 ENCOUNTER — Other Ambulatory Visit: Payer: Self-pay

## 2018-10-15 ENCOUNTER — Encounter (HOSPITAL_COMMUNITY): Payer: Self-pay | Admitting: Psychiatry

## 2018-10-15 DIAGNOSIS — F331 Major depressive disorder, recurrent, moderate: Secondary | ICD-10-CM | POA: Diagnosis not present

## 2018-10-15 DIAGNOSIS — F4310 Post-traumatic stress disorder, unspecified: Secondary | ICD-10-CM | POA: Diagnosis not present

## 2018-10-15 MED ORDER — TRAZODONE HCL 150 MG PO TABS: 150.0000 mg | ORAL_TABLET | Freq: Every day | ORAL | 0 refills | Status: DC

## 2018-10-15 MED ORDER — ARIPIPRAZOLE 5 MG PO TABS: ORAL_TABLET | ORAL | 0 refills | Status: DC

## 2018-10-15 MED ORDER — CITALOPRAM HYDROBROMIDE 40 MG PO TABS: 40.0000 mg | ORAL_TABLET | Freq: Every day | ORAL | 0 refills | Status: DC
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
## 2018-11-06 ENCOUNTER — Other Ambulatory Visit (HOSPITAL_COMMUNITY)
Admission: RE | Admit: 2018-11-06 | Discharge: 2018-11-06 | Disposition: A | Discharge status: Home / Self Care | Payer: BC Managed Care – PPO | Source: Ambulatory Visit | Attending: Family Medicine | Admitting: Family Medicine

## 2018-11-06 DIAGNOSIS — Z124 Encounter for screening for malignant neoplasm of cervix: Secondary | ICD-10-CM | POA: Diagnosis present

## 2019-01-09 ENCOUNTER — Encounter (HOSPITAL_COMMUNITY): Payer: Self-pay | Admitting: Psychiatry

## 2019-01-09 ENCOUNTER — Ambulatory Visit (INDEPENDENT_AMBULATORY_CARE_PROVIDER_SITE_OTHER): Payer: BC Managed Care – PPO | Admitting: Psychiatry

## 2019-01-09 ENCOUNTER — Other Ambulatory Visit: Payer: Self-pay

## 2019-01-09 DIAGNOSIS — F4310 Post-traumatic stress disorder, unspecified: Secondary | ICD-10-CM

## 2019-01-09 DIAGNOSIS — F331 Major depressive disorder, recurrent, moderate: Secondary | ICD-10-CM

## 2019-01-09 MED ORDER — TRAZODONE HCL 100 MG PO TABS: 200.0000 mg | ORAL_TABLET | Freq: Every day | ORAL | 0 refills | Status: DC

## 2019-01-09 MED ORDER — ARIPIPRAZOLE 5 MG PO TABS: ORAL_TABLET | ORAL | 0 refills | Status: DC

## 2019-01-09 MED ORDER — CITALOPRAM HYDROBROMIDE 40 MG PO TABS: 40.0000 mg | ORAL_TABLET | Freq: Every day | ORAL | 0 refills | Status: DC
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
## 2019-03-02 ENCOUNTER — Ambulatory Visit: Payer: BC Managed Care – PPO | Attending: Internal Medicine

## 2019-03-02 DIAGNOSIS — Z23 Encounter for immunization: Secondary | ICD-10-CM

## 2019-03-02 NOTE — Progress Notes (Signed)
   Covid-19 Vaccination Clinic  Name:  MYLYNN DINH    MRN: [id number] DOB: 10/14/1970  03/02/2019  Ms. Monroe-Leach was observed post Covid-19 immunization for 15 minutes without incidence. She was provided with Vaccine Information Sheet and instruction to access the V-Safe system.   Ms. Urschel was instructed to call 911 with any severe reactions post vaccine: [name] Difficulty breathing  . Swelling of your face and throat  . A fast heartbeat  . A bad rash all over your body  . Dizziness and weakness    Immunizations Administered    Name Date Dose VIS Date Route   Pfizer COVID-19 Vaccine 03/02/2019  5:54 PM 0.3 mL 12/14/2018 Intramuscular   Manufacturer: [employer], [employer]   Lot: [id number]   NDC: [address]-3

## 2019-03-23 ENCOUNTER — Ambulatory Visit: Payer: BC Managed Care – PPO | Attending: Internal Medicine

## 2019-03-23 DIAGNOSIS — Z23 Encounter for immunization: Secondary | ICD-10-CM

## 2019-03-23 NOTE — Progress Notes (Signed)
   Covid-19 Vaccination Clinic  Name:  Kathleen Odonnell    MRN: [id number] DOB: 01-08-1970  03/23/2019  Kathleen Odonnell was observed post Covid-19 immunization for 15 minutes without incident. She was provided with Vaccine Information Sheet and instruction to access the V-Safe system.   Kathleen Odonnell was instructed to call 911 with any severe reactions post vaccine: [name] Difficulty breathing  . Swelling of face and throat  . A fast heartbeat  . A bad rash all over body  . Dizziness and weakness   Immunizations Administered    Name Date Dose VIS Date Route   Pfizer COVID-19 Vaccine 03/23/2019  9:20 AM 0.3 mL 12/14/2018 Intramuscular   Manufacturer: [employer], [employer]   Lot: [id number]   NDC: [address]-5

## 2019-04-09 ENCOUNTER — Ambulatory Visit (INDEPENDENT_AMBULATORY_CARE_PROVIDER_SITE_OTHER): Payer: BC Managed Care – PPO | Admitting: Psychiatry

## 2019-04-09 ENCOUNTER — Other Ambulatory Visit: Payer: Self-pay

## 2019-04-09 ENCOUNTER — Encounter (HOSPITAL_COMMUNITY): Payer: Self-pay | Admitting: Psychiatry

## 2019-04-09 DIAGNOSIS — F4310 Post-traumatic stress disorder, unspecified: Secondary | ICD-10-CM

## 2019-04-09 DIAGNOSIS — F331 Major depressive disorder, recurrent, moderate: Secondary | ICD-10-CM | POA: Diagnosis not present

## 2019-04-09 MED ORDER — CITALOPRAM HYDROBROMIDE 40 MG PO TABS: 40.0000 mg | ORAL_TABLET | Freq: Every day | ORAL | 0 refills | Status: DC

## 2019-04-09 MED ORDER — TRAZODONE HCL 100 MG PO TABS: 200.0000 mg | ORAL_TABLET | Freq: Every day | ORAL | 0 refills | Status: DC

## 2019-04-09 MED ORDER — ARIPIPRAZOLE 5 MG PO TABS: ORAL_TABLET | ORAL | 0 refills | Status: DC
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
## 2019-07-02 ENCOUNTER — Other Ambulatory Visit: Payer: Self-pay | Admitting: Family Medicine

## 2019-07-02 DIAGNOSIS — Z1231 Encounter for screening mammogram for malignant neoplasm of breast: Secondary | ICD-10-CM

## 2019-07-09 ENCOUNTER — Telehealth (INDEPENDENT_AMBULATORY_CARE_PROVIDER_SITE_OTHER): Payer: BC Managed Care – PPO | Admitting: Psychiatry

## 2019-07-09 ENCOUNTER — Other Ambulatory Visit: Payer: Self-pay

## 2019-07-09 ENCOUNTER — Encounter (HOSPITAL_COMMUNITY): Payer: Self-pay | Admitting: Psychiatry

## 2019-07-09 DIAGNOSIS — F331 Major depressive disorder, recurrent, moderate: Secondary | ICD-10-CM | POA: Diagnosis not present

## 2019-07-09 DIAGNOSIS — F4310 Post-traumatic stress disorder, unspecified: Secondary | ICD-10-CM | POA: Diagnosis not present

## 2019-07-09 MED ORDER — CITALOPRAM HYDROBROMIDE 40 MG PO TABS: 40.0000 mg | ORAL_TABLET | Freq: Every day | ORAL | 0 refills | Status: DC

## 2019-07-09 MED ORDER — ARIPIPRAZOLE 5 MG PO TABS: ORAL_TABLET | ORAL | 0 refills | Status: DC

## 2019-07-09 MED ORDER — TRAZODONE HCL 100 MG PO TABS: 200.0000 mg | ORAL_TABLET | Freq: Every day | ORAL | 0 refills | Status: DC
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
## 2019-09-16 ENCOUNTER — Ambulatory Visit: Payer: BC Managed Care – PPO

## 2019-10-09 ENCOUNTER — Telehealth (INDEPENDENT_AMBULATORY_CARE_PROVIDER_SITE_OTHER): Payer: BC Managed Care – PPO | Admitting: Psychiatry

## 2019-10-09 ENCOUNTER — Other Ambulatory Visit: Payer: Self-pay

## 2019-10-09 ENCOUNTER — Encounter (HOSPITAL_COMMUNITY): Payer: Self-pay | Admitting: Psychiatry

## 2019-10-09 DIAGNOSIS — F4310 Post-traumatic stress disorder, unspecified: Secondary | ICD-10-CM

## 2019-10-09 DIAGNOSIS — F331 Major depressive disorder, recurrent, moderate: Secondary | ICD-10-CM | POA: Diagnosis not present

## 2019-10-09 MED ORDER — TRAZODONE HCL 100 MG PO TABS: 200.0000 mg | ORAL_TABLET | Freq: Every day | ORAL | 0 refills | Status: DC

## 2019-10-09 MED ORDER — ARIPIPRAZOLE 5 MG PO TABS: ORAL_TABLET | ORAL | 0 refills | Status: DC

## 2019-10-09 MED ORDER — CITALOPRAM HYDROBROMIDE 40 MG PO TABS: 40.0000 mg | ORAL_TABLET | Freq: Every day | ORAL | 0 refills | Status: DC
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
## 2019-12-25 ENCOUNTER — Ambulatory Visit: Payer: BC Managed Care – PPO

## 2020-01-09 ENCOUNTER — Encounter (HOSPITAL_COMMUNITY): Payer: Self-pay | Admitting: Psychiatry

## 2020-01-09 ENCOUNTER — Other Ambulatory Visit: Payer: Self-pay

## 2020-01-09 ENCOUNTER — Telehealth (INDEPENDENT_AMBULATORY_CARE_PROVIDER_SITE_OTHER): Payer: BC Managed Care – PPO | Admitting: Psychiatry

## 2020-01-09 DIAGNOSIS — F331 Major depressive disorder, recurrent, moderate: Secondary | ICD-10-CM | POA: Diagnosis not present

## 2020-01-09 DIAGNOSIS — F4310 Post-traumatic stress disorder, unspecified: Secondary | ICD-10-CM

## 2020-01-09 MED ORDER — TRAZODONE HCL 100 MG PO TABS: 200.0000 mg | ORAL_TABLET | Freq: Every day | ORAL | 0 refills | Status: DC

## 2020-01-09 MED ORDER — ARIPIPRAZOLE 5 MG PO TABS: ORAL_TABLET | ORAL | 0 refills | Status: DC

## 2020-01-09 MED ORDER — CITALOPRAM HYDROBROMIDE 40 MG PO TABS: 40.0000 mg | ORAL_TABLET | Freq: Every day | ORAL | 0 refills | Status: DC
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
## 2020-02-17 ENCOUNTER — Other Ambulatory Visit: Payer: Self-pay | Admitting: Family Medicine

## 2020-02-17 DIAGNOSIS — Z1231 Encounter for screening mammogram for malignant neoplasm of breast: Secondary | ICD-10-CM

## 2020-03-18 ENCOUNTER — Ambulatory Visit
Admission: RE | Admit: 2020-03-18 | Discharge: 2020-03-18 | Disposition: A | Discharge status: Home / Self Care | Payer: BC Managed Care – PPO | Source: Ambulatory Visit

## 2020-03-18 ENCOUNTER — Other Ambulatory Visit: Payer: Self-pay

## 2020-03-18 DIAGNOSIS — Z1231 Encounter for screening mammogram for malignant neoplasm of breast: Secondary | ICD-10-CM

## 2020-04-02 ENCOUNTER — Telehealth (HOSPITAL_COMMUNITY): Payer: Self-pay | Admitting: Psychiatry

## 2020-04-02 ENCOUNTER — Other Ambulatory Visit (HOSPITAL_COMMUNITY): Payer: BC Managed Care – PPO | Attending: Psychiatry | Admitting: Psychiatry

## 2020-04-02 ENCOUNTER — Other Ambulatory Visit: Payer: Self-pay

## 2020-04-02 NOTE — Telephone Encounter (Signed)
D:  Patient phoned stating that [name], [facility] had referred her back to [facility].  A:  Re-oriented pt and updated CCA.  Patient will start on 04-06-20 @ 9 a.m.  R:  Patient receptive.
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
## 2020-04-06 ENCOUNTER — Other Ambulatory Visit: Payer: Self-pay

## 2020-04-06 ENCOUNTER — Other Ambulatory Visit (HOSPITAL_COMMUNITY): Payer: BC Managed Care – PPO | Attending: Psychiatry | Admitting: Psychiatry

## 2020-04-06 ENCOUNTER — Encounter (HOSPITAL_COMMUNITY): Payer: Self-pay | Admitting: Psychiatry

## 2020-04-06 DIAGNOSIS — F329 Major depressive disorder, single episode, unspecified: Secondary | ICD-10-CM | POA: Diagnosis not present

## 2020-04-06 DIAGNOSIS — F4310 Post-traumatic stress disorder, unspecified: Secondary | ICD-10-CM | POA: Insufficient documentation

## 2020-04-06 DIAGNOSIS — F331 Major depressive disorder, recurrent, moderate: Secondary | ICD-10-CM

## 2020-04-06 NOTE — Progress Notes (Signed)
Virtual Visit via Video Note  I connected with Kathleen Odonnell on 04/06/20 at  9:00 AM EDT by a video enabled telemedicine application and verified that I am speaking with the correct person using two identifiers.  At orientation to the IOP program, Case Manager discussed the limitations of evaluation and management by telemedicine and the availability of in person appointments. The patient expressed understanding and agreed to proceed with virtual visits throughout the duration of the program.   Location:  Patient: Patient Home Provider: Home Office   History of Present Illness: PTSD and MDD  Observations/Objective: Check In: Case Manager checked in with all participants to review discharge dates, insurance authorizations, work-related documents and needs from the treatment team regarding medications. Client stated needs and engaged in discussion. Case Manager introduced 2 new Clients to the group, with group members starting the joining process.   Initial Therapeutic Activity: Counselor facilitated therapeutic processing with group members to assess mood and current functioning, prompting group members to share about application of skills, progress and challenges in treatment/personal lives. Topics covered: grief and loss, repairing relationships, faith related to recovery, overcoming anxiety and structuring day. Client engaged in discussions and shared application of skills. Client presents with severe depression and moderate anxiety. Client denied any current SI/HI/psychosis.   Second Therapeutic Activity: Counselor engaged group in creation of ECOMaps to assess their support systems, relationship dynamics, energy given and taken and boundaries in relationships. Counselor prompted group members to share their final products, highlighting themes/areas of concern in the visual representation of their lives. Counselor challenged group members to choose 2-3 dynamics they would like to improve  over the next few months using communication and boundary setting skills. Client engaged in discussion and participated well in activity.     Check Out: Counselor prompted group members to choose a relaxation or self-care activity for the afternoon, as we covered heavy emotional topics in group. Counselor promoted self-compassion. Client endorsed safety plan to be followed to prevent safety issues.   Assessment and Plan: Clinician recommends that Client remain in IOP treatment to better manage mental health symptoms, stabilization and to address treatment plan goals. Clinician recommends adherence to crisis/safety plan, taking medications as prescribed, and following up with medical professionals if any issues arise.   Follow Up Instructions: Clinician will send Webex link for next session. The Client was advised to call back or seek an in-person evaluation if the symptoms worsen or if the condition fails to improve as anticipated.     I provided 180 minutes of non-face-to-face time during this encounter.     Hilbert Odor, LCSW

## 2020-04-07 ENCOUNTER — Other Ambulatory Visit (HOSPITAL_COMMUNITY): Payer: BC Managed Care – PPO | Admitting: Psychiatry

## 2020-04-07 ENCOUNTER — Encounter (HOSPITAL_COMMUNITY): Payer: Self-pay | Admitting: Psychiatry

## 2020-04-07 ENCOUNTER — Other Ambulatory Visit: Payer: Self-pay

## 2020-04-07 DIAGNOSIS — F4310 Post-traumatic stress disorder, unspecified: Secondary | ICD-10-CM | POA: Diagnosis not present

## 2020-04-07 DIAGNOSIS — F331 Major depressive disorder, recurrent, moderate: Secondary | ICD-10-CM

## 2020-04-07 NOTE — Progress Notes (Signed)
Virtual Visit via Video Note  I connected with Kathleen Odonnell on 04/07/20 at  9:00 AM EDT by a video enabled telemedicine application and verified that I am speaking with the correct person using two identifiers.  At orientation to the IOP program, Case Manager discussed the limitations of evaluation and management by telemedicine and the availability of in person appointments. The patient expressed understanding and agreed to proceed with virtual visits throughout the duration of the program.   Location:  Patient: Patient Home Provider: Home Office   History of Present Illness: PTSD and MDD  Observations/Objective: Check In: Case Manager checked in with all participants to review discharge dates, insurance authorizations, work-related documents and needs from the treatment team regarding medications. Client stated needs and engaged in discussion. Case Manager introduced 1 new Client to the group, with group members starting the joining process.   Initial Therapeutic Activity: Counselor facilitated therapeutic processing with group members to assess mood and current functioning, prompting group members to share about application of skills, progress and challenges in treatment/personal lives. Topics covered: panic attacks, coping skills, grief work, childhood traumas, and medication management. Client engaged in discussions and shared application of skills. Client presents with severe depression and moderate anxiety. Client denied any current SI/HI/psychosis.   Second Therapeutic Activity: Counselor prompted group members to compare and contrast their anxiety and depression. Counselor instructed group to write down symptoms, thoughts, feelings, behaviors, sensations, triggers and anything else connected with their anxiety and depression to see what was similar and different. Group members shared their results, discussing how they are impacted and themes in their reflections. Counselor  prompted group members to choose 2 or 3 symptoms to focus on improving or addressing throughout their group treatment process. Client engaged and participated well in the activity.    Check Out: Counselor prompted group members to choose a relaxation or self-care activity for the afternoon, as we covered heavy emotional topics in group. Counselor promoted self-compassion. Client endorsed safety plan to be followed to prevent safety issues.   Assessment and Plan: Clinician recommends that Client remain in IOP treatment to better manage mental health symptoms, stabilization and to address treatment plan goals. Clinician recommends adherence to crisis/safety plan, taking medications as prescribed, and following up with medical professionals if any issues arise.   Follow Up Instructions: Clinician will send Webex link for next session. The Client was advised to call back or seek an in-person evaluation if the symptoms worsen or if the condition fails to improve as anticipated.     I provided 180 minutes of non-face-to-face time during this encounter.     Hilbert Odor, LCSW

## 2020-04-08 ENCOUNTER — Encounter (HOSPITAL_COMMUNITY): Payer: Self-pay

## 2020-04-08 ENCOUNTER — Other Ambulatory Visit (HOSPITAL_COMMUNITY): Payer: BC Managed Care – PPO | Admitting: Family

## 2020-04-08 ENCOUNTER — Other Ambulatory Visit: Payer: Self-pay

## 2020-04-08 DIAGNOSIS — F4310 Post-traumatic stress disorder, unspecified: Secondary | ICD-10-CM | POA: Diagnosis not present

## 2020-04-08 DIAGNOSIS — F331 Major depressive disorder, recurrent, moderate: Secondary | ICD-10-CM

## 2020-04-08 NOTE — Progress Notes (Signed)
Virtual Visit via Video Note  I connected with Annya R Monroe-Leach on 04/08/20 at  9:00 AM EDT by a video enabled telemedicine application and verified that I am speaking with the correct person using two identifiers.  At orientation to the IOP program, Case Manager discussed the limitations of evaluation and management by telemedicine and the availability of in person appointments. The patient expressed understanding and agreed to proceed with virtual visits throughout the duration of the program.   Location:  Patient: Patient Home Provider: Home Office   History of Present Illness: PTSD  Observations/Objective: Check In: Case Manager checked in with all participants to review discharge dates, insurance authorizations, work-related documents and needs from the treatment team regarding medications. Client stated needs and engaged in discussion. Case Manager introduced 1 new Client to the group, with group members starting the joining process.   Initial Therapeutic Activity: Counselor introduced our guest speaker, Peggye Fothergill, [facility] Pharmacist, who shared about psychiatric medications, side effects, treatment considerations and how to communicate with medical professionals. Group Members asked questions and shared medication concerns. Counselor prompted group members to reference a worksheet called, "Body Scan" to jot down questions and concerns about their physical health in preparation for their upcoming appointments with medical professionals. Counselor encouraged routine medical check-ups, preparing for appointments, following up with recommendations and seeking specialist if needed.   Second Therapeutic Activity: Counselor facilitated therapeutic processing with group members to assess mood and current functioning, prompting group members to share about application of skills, progress and challenges in treatment/personal lives. Topics covered: panic attacks, coping skills, grief work,  childhood traumas, and medication management. Client engaged in discussions and shared application of skills. Client presents with moderate depression and moderate anxiety. Client denied any current SI/HI/psychosis.   Check Out: Counselor closed program by allowing time to celebrate a graduating group member. Counselor shared reflections on progress and allow space for group members to share well wishes and appreciates to the graduating client. Counselor prompted graduating client to share takeaways, reflect on progress and final thoughts for the group. Counselor prompted group members to choose a relaxation or self-care activity for the afternoon, as we covered heavy emotional topics in group. Counselor promoted self-compassion. Client endorsed safety plan to be followed to prevent safety issues.   Assessment and Plan: Clinician recommends that Client remain in IOP treatment to better manage mental health symptoms, stabilization and to address treatment plan goals. Clinician recommends adherence to crisis/safety plan, taking medications as prescribed, and following up with medical professionals if any issues arise.   Follow Up Instructions: Clinician will send Webex link for next session. The Client was advised to call back or seek an in-person evaluation if the symptoms worsen or if the condition fails to improve as anticipated.     I provided 180 minutes of non-face-to-face time during this encounter.     Hilbert Odor, LCSW
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
## 2020-04-09 ENCOUNTER — Other Ambulatory Visit (HOSPITAL_COMMUNITY): Payer: BC Managed Care – PPO | Admitting: Psychiatry

## 2020-04-09 ENCOUNTER — Telehealth (HOSPITAL_COMMUNITY): Payer: Self-pay | Admitting: Psychiatry

## 2020-04-09 ENCOUNTER — Other Ambulatory Visit: Payer: Self-pay

## 2020-04-09 ENCOUNTER — Encounter (HOSPITAL_COMMUNITY): Payer: Self-pay | Admitting: Psychiatry

## 2020-04-09 DIAGNOSIS — F4310 Post-traumatic stress disorder, unspecified: Secondary | ICD-10-CM

## 2020-04-09 DIAGNOSIS — F331 Major depressive disorder, recurrent, moderate: Secondary | ICD-10-CM

## 2020-04-09 NOTE — Progress Notes (Signed)
Virtual Visit via Video Note  I connected with Karsten R Monroe-Leach on 04/09/20 at  9:00 AM EDT by a video enabled telemedicine application and verified that I am speaking with the correct person using two identifiers.  At orientation to the IOP program, Case Manager discussed the limitations of evaluation and management by telemedicine and the availability of in person appointments. The patient expressed understanding and agreed to proceed with virtual visits throughout the duration of the program.   Location:  Patient: Patient Home Provider: Home Office   History of Present Illness: MDD and PTSD  Observations/Objective: Check In: Case Manager checked in with all participants to review discharge dates, insurance authorizations, work-related documents and needs from the treatment team regarding medications. Client stated needs and engaged in discussion.   Initial Therapeutic Activity: Counselor facilitated therapeutic processing with group members to assess mood and current functioning, prompting group members to share about application of skills, progress and challenges in treatment/personal lives. Topics covered: work-stress solutions, sleep hygiene, social connections and following up on homework to address medical issues. Client presents with moderate depression and moderate anxiety. Client denied any current SI/HI/psychosis.   Second Therapeutic Activity: Counselor engaged group in discussion on self-compassion discussion meaning and current practices. Counselor prompted group members to compete a self-compassion assessment. Group members shared they scores and indications. Counselor provided group with tools, resources and practices to improve self-compassion. Counselor facilitated a Radio producer, then group shared their preferences and experiences. Client engaged well in discussion and activities.    Check Out: Counselor closed program by allowing time to celebrate a graduating  group member. Counselor shared reflections on progress and allow space for group members to share well wishes and appreciates to the graduating client. Counselor prompted graduating client to share takeaways, reflect on progress and final thoughts for the group. Counselor prompted group members to choose a relaxation or self-care activity for the afternoon, as we covered heavy emotional topics in group. Counselor promoted self-compassion. Client endorsed safety plan to be followed to prevent safety issues.   Assessment and Plan: Clinician recommends that Client remain in IOP treatment to better manage mental health symptoms, stabilization and to address treatment plan goals. Clinician recommends adherence to crisis/safety plan, taking medications as prescribed, and following up with medical professionals if any issues arise.   Follow Up Instructions: Clinician will send Webex link for next session. The Client was advised to call back or seek an in-person evaluation if the symptoms worsen or if the condition fails to improve as anticipated.     I provided 180 minutes of non-face-to-face time during this encounter.     Hilbert Odor, LCSW

## 2020-04-10 ENCOUNTER — Other Ambulatory Visit (HOSPITAL_COMMUNITY): Payer: BC Managed Care – PPO | Admitting: Psychiatry

## 2020-04-10 ENCOUNTER — Other Ambulatory Visit: Payer: Self-pay

## 2020-04-10 ENCOUNTER — Encounter (HOSPITAL_COMMUNITY): Payer: Self-pay

## 2020-04-10 DIAGNOSIS — F331 Major depressive disorder, recurrent, moderate: Secondary | ICD-10-CM

## 2020-04-10 DIAGNOSIS — F4310 Post-traumatic stress disorder, unspecified: Secondary | ICD-10-CM | POA: Diagnosis not present

## 2020-04-10 MED ORDER — HYDROXYZINE HCL 25 MG PO TABS: 25.0000 mg | ORAL_TABLET | Freq: Three times a day (TID) | ORAL | 0 refills | Status: DC | PRN

## 2020-04-10 NOTE — Addendum Note (Signed)
Addended by: [name] on: 04/10/2020 10:21 AM   Modules accepted: Orders

## 2020-04-10 NOTE — Progress Notes (Signed)
Virtual Visit via Video Note  I connected with Kathleen Odonnell on 04/10/20 at  9:00 AM EDT by a video enabled telemedicine application and verified that I am speaking with the correct person using two identifiers.  At orientation to the IOP program, Case Manager discussed the limitations of evaluation and management by telemedicine and the availability of in person appointments. The patient expressed understanding and agreed to proceed with virtual visits throughout the duration of the program.   Location:  Patient: Patient Home Provider: Home Office   History of Present Illness: MDD and PTSD  Observations/Objective: Check In: Case Manager checked in with all participants to review discharge dates, insurance authorizations, work-related documents and needs from the treatment team regarding medications. Client stated needs and engaged in discussion.   Initial Therapeutic Activity: Counselor facilitated therapeutic processing with group members to assess mood and current functioning, prompting group members to share about application of skills, progress and challenges in treatment/personal lives. Topics covered: parenting challenges, communicating needs, work-related stress, sleep hygiene, and generational patterns. Client engaged in discussions and shared application of skills. Client presents with moderate depression and moderate anxiety. Client denied any current SI/HI/psychosis.   Second Therapeutic Activity: Counselor engaged group in discussion about self-care. Counselor shared 2 handouts with self-care wheels to explore where Client's can improve their self-care practices. Client identified specific self-care practices and skills she would like to implement in her daily and weekly life. Client stated that she rarely makes time for self and would like to structure more into her schedule, as she sees the importance and benefits.    Check Out: Counselor prompted group members to choose a  relaxation or self-care activity for the weekend. Client plans to go dress shopping for her wedding renewal with family. Counselor promoted self-compassion. Client endorsed safety plan to be followed to prevent safety issues.   Assessment and Plan: Clinician recommends that Client remain in IOP treatment to better manage mental health symptoms, stabilization and to address treatment plan goals. Clinician recommends adherence to crisis/safety plan, taking medications as prescribed, and following up with medical professionals if any issues arise.   Follow Up Instructions: Clinician will send Webex link for next session. The Client was advised to call back or seek an in-person evaluation if the symptoms worsen or if the condition fails to improve as anticipated.     I provided 180 minutes of non-face-to-face time during this encounter.     Hilbert Odor, LCSW

## 2020-04-13 ENCOUNTER — Other Ambulatory Visit: Payer: Self-pay

## 2020-04-13 ENCOUNTER — Other Ambulatory Visit (HOSPITAL_COMMUNITY): Payer: BC Managed Care – PPO | Admitting: Psychiatry

## 2020-04-13 ENCOUNTER — Encounter (HOSPITAL_COMMUNITY): Payer: Self-pay

## 2020-04-13 DIAGNOSIS — F4310 Post-traumatic stress disorder, unspecified: Secondary | ICD-10-CM

## 2020-04-13 DIAGNOSIS — F331 Major depressive disorder, recurrent, moderate: Secondary | ICD-10-CM

## 2020-04-13 NOTE — Progress Notes (Signed)
Virtual Visit via Video Note  I connected with Kathleen Odonnell on 04/13/20 at  9:00 AM EDT by a video enabled telemedicine application and verified that I am speaking with the correct person using two identifiers.  At orientation to the IOP program, Case Manager discussed the limitations of evaluation and management by telemedicine and the availability of in person appointments. The patient expressed understanding and agreed to proceed with virtual visits throughout the duration of the program.   Location:  Patient: Patient Home Provider: Home Office   History of Present Illness:   Observations/Objective: Check In: Case Manager checked in with all participants to review discharge dates, insurance authorizations, work-related documents and needs from the treatment team regarding medications. Client stated needs and engaged in discussion.   Initial Therapeutic Activity: Counselor facilitated therapeutic processing with group members to assess mood and current functioning, prompting group members to share about application of skills, progress and challenges in treatment/personal lives. Topics covered: grief and loss, financial stressors, medication side effects, communication with supports and family stressors. Client presents with moderate depression and moderate anxiety. Client denied any current SI/HI/psychosis.   Second Therapeutic Activity: Counselor provided group with an exhaustive list of community, state and national mental health, substance abuse and basic need resources. Counselor prompted group members to save numbers in their phones and to bookmark links for future use.   Check Out: Counselor prompted group members to choose a relaxation or self-care activity for the weekend. Counselor promoted self-compassion. Client endorsed safety plan to be followed to prevent safety issues.   Assessment and Plan: Clinician recommends that Client remain in IOP treatment to better manage  mental health symptoms, stabilization and to address treatment plan goals. Clinician recommends adherence to crisis/safety plan, taking medications as prescribed, and following up with medical professionals if any issues arise.   Follow Up Instructions: Clinician will send Webex link for next session. The Client was advised to call back or seek an in-person evaluation if the symptoms worsen or if the condition fails to improve as anticipated.     I provided 180 minutes of non-face-to-face time during this encounter.     Hilbert Odor, LCSW

## 2020-04-14 ENCOUNTER — Other Ambulatory Visit (HOSPITAL_COMMUNITY): Payer: BC Managed Care – PPO | Admitting: Psychiatry

## 2020-04-14 ENCOUNTER — Other Ambulatory Visit: Payer: Self-pay

## 2020-04-14 DIAGNOSIS — F331 Major depressive disorder, recurrent, moderate: Secondary | ICD-10-CM

## 2020-04-14 DIAGNOSIS — F4310 Post-traumatic stress disorder, unspecified: Secondary | ICD-10-CM | POA: Diagnosis not present

## 2020-04-15 ENCOUNTER — Other Ambulatory Visit (HOSPITAL_COMMUNITY): Payer: BC Managed Care – PPO | Admitting: Psychiatry

## 2020-04-15 ENCOUNTER — Encounter (HOSPITAL_COMMUNITY): Payer: Self-pay

## 2020-04-15 ENCOUNTER — Other Ambulatory Visit: Payer: Self-pay

## 2020-04-15 DIAGNOSIS — F331 Major depressive disorder, recurrent, moderate: Secondary | ICD-10-CM

## 2020-04-15 DIAGNOSIS — F4310 Post-traumatic stress disorder, unspecified: Secondary | ICD-10-CM | POA: Diagnosis not present

## 2020-04-15 NOTE — Progress Notes (Signed)
Virtual Visit via Video Note  I connected with Kathleen Odonnell on 04/15/20 at  9:00 AM EDT by a video enabled telemedicine application and verified that I am speaking with the correct person using two identifiers.  At orientation to the IOP program, Case Manager discussed the limitations of evaluation and management by telemedicine and the availability of in person appointments. The patient expressed understanding and agreed to proceed with virtual visits throughout the duration of the program.   Location:  Patient: Patient Home Provider: Home Office   History of Present Illness: MDD  Observations/Objective: Check In: Case Manager checked in with all participants to review discharge dates, insurance authorizations, work-related documents and needs from the treatment team regarding medications. Client stated needs and engaged in discussion. Case Manager introduced new client to the group, as group members welcomed and started joining process.  Initial Therapeutic Activity: Counselor facilitated therapeutic processing with group members to assess mood and current functioning, prompting group members to share about application of skills, progress and challenges in treatment/personal lives. Topics covered: how to organize environment, medication side effects, grief reminders/work, memory loss and challenges, and managing mental health as a partner. Client denied any current SI/HI/psychosis.   Second Therapeutic Activity: Counselor provided psychoeducation on goal writing and creation. Counselor used the S.M.A.R.T. Goals format, sharing a video and article with the group. Counselor allowed time for group members to generate their own S.M.A.R.T. Goals and ask questions about the process. Counselor prompted each group member to share goals aloud and received feedback from the group. Goals were focused around personal, health and relational aspirations. Client engaged well in the activity.     Check Out: Counselor prompted group members to choose a productivity activity or self-care practice to engage in today. Client endorsed safety plan to be followed to prevent safety issues.   Assessment and Plan: Clinician recommends that Client remain in IOP treatment to better manage mental health symptoms, stabilization and to address treatment plan goals. Clinician recommends adherence to crisis/safety plan, taking medications as prescribed, and following up with medical professionals if any issues arise.   Follow Up Instructions: Clinician will send Webex link for next session. The Client was advised to call back or seek an in-person evaluation if the symptoms worsen or if the condition fails to improve as anticipated.     I provided 180 minutes of non-face-to-face time during this encounter.     Hilbert Odor, LCSW

## 2020-04-16 ENCOUNTER — Encounter (HOSPITAL_COMMUNITY): Payer: Self-pay | Admitting: Psychiatry

## 2020-04-16 ENCOUNTER — Other Ambulatory Visit: Payer: Self-pay

## 2020-04-16 ENCOUNTER — Other Ambulatory Visit (HOSPITAL_COMMUNITY): Payer: BC Managed Care – PPO

## 2020-04-16 NOTE — Progress Notes (Signed)
Virtual Visit via Video Note  I connected with Kathleen Odonnell on 04/16/20 at  9:00 AM EDT by a video enabled telemedicine application and verified that I am speaking with the correct person using two identifiers.  At orientation to the IOP program, Case Manager discussed the limitations of evaluation and management by telemedicine and the availability of in person appointments. The patient expressed understanding and agreed to proceed with virtual visits throughout the duration of the program.   Location:  Patient: Patient Home Provider: Home Office   History of Present Illness: MDD and PTSD  Observations/Objective: Check In: Case Manager checked in with all participants to review discharge dates, insurance authorizations, work-related documents and needs from the treatment team regarding medications. Client stated needs and engaged in discussion.   Initial Therapeutic Activity: Counselor facilitated therapeutic processing with group members to assess mood and current functioning, prompting group members to share about application of skills, progress and challenges in treatment/personal lives. Topics covered: work related stress, impact of racism on MH, financial health, view on MH labels/diagnosis and goal setting. Client denied any current SI/HI/psychosis.   Second Therapeutic Activity: Counselor provided psychoeducation on setting intentions and creating a Life Purpose Statement. Counselor shared 2 articles and several examples for context and application. Counselor allowed time for group members to generate their own statements and intentions and ask questions about the process. Counselor prompted each group member to share what they developed aloud and received feedback from the group. Goals were focused around personal, financial, health and relational aspirations. Client engaged well in the activity.    Check Out: Counselor prompted group members to choose a productivity activity  or self-care practice to engage in today. Client plans to spend quality time with family. Client endorsed safety plan to be followed to prevent safety issues.   Assessment and Plan: Clinician recommends that Client remain in IOP treatment to better manage mental health symptoms, stabilization and to address treatment plan goals. Clinician recommends adherence to crisis/safety plan, taking medications as prescribed, and following up with medical professionals if any issues arise.   Follow Up Instructions: Clinician will send Webex link for next session. The Client was advised to call back or seek an in-person evaluation if the symptoms worsen or if the condition fails to improve as anticipated.     I provided 180 minutes of non-face-to-face time during this encounter.     Hilbert Odor, LCSW

## 2020-04-17 ENCOUNTER — Encounter (HOSPITAL_COMMUNITY): Payer: Self-pay

## 2020-04-17 ENCOUNTER — Other Ambulatory Visit (HOSPITAL_COMMUNITY): Payer: BC Managed Care – PPO | Admitting: Psychiatry

## 2020-04-17 ENCOUNTER — Other Ambulatory Visit: Payer: Self-pay

## 2020-04-17 DIAGNOSIS — F4310 Post-traumatic stress disorder, unspecified: Secondary | ICD-10-CM | POA: Diagnosis not present

## 2020-04-17 DIAGNOSIS — F331 Major depressive disorder, recurrent, moderate: Secondary | ICD-10-CM

## 2020-04-17 NOTE — Progress Notes (Signed)
Virtual Visit via Video Note  I connected with Kathleen Odonnell on 04/17/20 at  9:00 AM EDT by a video enabled telemedicine application and verified that I am speaking with the correct person using two identifiers.  At orientation to the IOP program, Case Manager discussed the limitations of evaluation and management by telemedicine and the availability of in person appointments. The patient expressed understanding and agreed to proceed with virtual visits throughout the duration of the program.   Location:  Patient: Patient Home Provider: Home Office   History of Present Illness: MDD  Observations/Objective: Check In: Case Manager checked in with all participants to review discharge dates, insurance authorizations, work-related documents and needs from the treatment team regarding medications. Client stated needs and engaged in discussion. Case Manager reminded of contact and crisis numbers, and plan for program coverage next week. Case Manager introduced a new Client to the group, with group members welcoming and starting the joining process.   Initial Therapeutic Activity: Counselor facilitated a check-in with group members to assess mood and current functioning. Counselor facilitated group discussion on following topics, sharing coping skills and strategies to address: relational challenges, boundary setting, assertive communication, post-partum depression, social anxiety, familial and generational patterns. Client presents with moderate depression and moderate anxiety. Client engaged in discussion and provided feedback for other group members. Client denied any current SI/HI/psychosis.   Second Therapeutic Activity: Counselor prompted group members to share needs and recommendations for topics to cover in upcoming sessions to best address current mental health challenges. Group Members each shared their ideas, including time management, discharge planning, coping skills, managing work  stress, vulnerability, authenticity, and grief and loss. Client engaged well in discussion.    Check Out: Counselor prompted group members to choose a productivity activity or self-care practice to engage in over the weekend. Client plans to spend time with family out of town. Client endorsed safety plan to be followed to prevent safety issues.   Assessment and Plan: Clinician recommends that Client remain in IOP treatment to better manage mental health symptoms, stabilization and to address treatment plan goals. Clinician recommends adherence to crisis/safety plan, taking medications as prescribed, and following up with medical professionals if any issues arise.   Follow Up Instructions: Clinician will send Webex link for next session. The Client was advised to call back or seek an in-person evaluation if the symptoms worsen or if the condition fails to improve as anticipated.     I provided 180 minutes of non-face-to-face time during this encounter.     Hilbert Odor, LCSW

## 2020-04-20 ENCOUNTER — Other Ambulatory Visit (HOSPITAL_COMMUNITY): Payer: BC Managed Care – PPO | Admitting: Licensed Clinical Social Worker

## 2020-04-20 ENCOUNTER — Other Ambulatory Visit: Payer: Self-pay

## 2020-04-20 DIAGNOSIS — F4310 Post-traumatic stress disorder, unspecified: Secondary | ICD-10-CM | POA: Diagnosis not present

## 2020-04-20 DIAGNOSIS — F331 Major depressive disorder, recurrent, moderate: Secondary | ICD-10-CM

## 2020-04-21 ENCOUNTER — Other Ambulatory Visit: Payer: Self-pay

## 2020-04-21 ENCOUNTER — Other Ambulatory Visit (HOSPITAL_COMMUNITY): Payer: BC Managed Care – PPO | Admitting: Licensed Clinical Social Worker

## 2020-04-21 DIAGNOSIS — F4310 Post-traumatic stress disorder, unspecified: Secondary | ICD-10-CM | POA: Diagnosis not present

## 2020-04-21 DIAGNOSIS — F331 Major depressive disorder, recurrent, moderate: Secondary | ICD-10-CM

## 2020-04-22 ENCOUNTER — Other Ambulatory Visit: Payer: Self-pay

## 2020-04-22 ENCOUNTER — Other Ambulatory Visit (HOSPITAL_COMMUNITY): Payer: BC Managed Care – PPO | Admitting: Licensed Clinical Social Worker

## 2020-04-22 DIAGNOSIS — F4310 Post-traumatic stress disorder, unspecified: Secondary | ICD-10-CM | POA: Diagnosis not present

## 2020-04-22 DIAGNOSIS — F331 Major depressive disorder, recurrent, moderate: Secondary | ICD-10-CM

## 2020-04-23 ENCOUNTER — Other Ambulatory Visit: Payer: Self-pay

## 2020-04-23 ENCOUNTER — Other Ambulatory Visit (HOSPITAL_COMMUNITY): Payer: BC Managed Care – PPO | Admitting: Licensed Clinical Social Worker

## 2020-04-23 DIAGNOSIS — F331 Major depressive disorder, recurrent, moderate: Secondary | ICD-10-CM

## 2020-04-23 DIAGNOSIS — F4310 Post-traumatic stress disorder, unspecified: Secondary | ICD-10-CM | POA: Diagnosis not present

## 2020-04-24 ENCOUNTER — Other Ambulatory Visit (HOSPITAL_COMMUNITY): Payer: BC Managed Care – PPO | Admitting: Licensed Clinical Social Worker

## 2020-04-24 ENCOUNTER — Other Ambulatory Visit: Payer: Self-pay

## 2020-04-24 DIAGNOSIS — F331 Major depressive disorder, recurrent, moderate: Secondary | ICD-10-CM

## 2020-04-24 DIAGNOSIS — F4310 Post-traumatic stress disorder, unspecified: Secondary | ICD-10-CM | POA: Diagnosis not present

## 2020-04-27 ENCOUNTER — Encounter (HOSPITAL_COMMUNITY): Payer: Self-pay | Admitting: Psychiatry

## 2020-04-27 ENCOUNTER — Other Ambulatory Visit: Payer: Self-pay

## 2020-04-27 ENCOUNTER — Other Ambulatory Visit (HOSPITAL_COMMUNITY): Payer: BC Managed Care – PPO | Admitting: Psychiatry

## 2020-04-27 DIAGNOSIS — F331 Major depressive disorder, recurrent, moderate: Secondary | ICD-10-CM

## 2020-04-27 DIAGNOSIS — F4310 Post-traumatic stress disorder, unspecified: Secondary | ICD-10-CM

## 2020-04-27 NOTE — Patient Instructions (Signed)
D:  Patient completed MH-IOP today.  A:  Discharge today.  Follow up with Dr. Lolly Mustache on 04-29-20 @ 4 pm and patient to contact [name], [facility] for an appointment.  Encouraged support groups.  R:  Patient receptive.

## 2020-04-27 NOTE — Progress Notes (Signed)
Virtual Visit via Video Note  I connected with Kathleen Odonnell on 04/27/20 at  9:00 AM EDT by a video enabled telemedicine application and verified that I am speaking with the correct person using two identifiers.  At orientation to the IOP program, Case Manager discussed the limitations of evaluation and management by telemedicine and the availability of in person appointments. The patient expressed understanding and agreed to proceed with virtual visits throughout the duration of the program.   Location:  Patient: Patient Home Provider: Home Office   History of Present Illness: MDD  Observations/Objective: Check In: Case Manager checked in with all participants to review discharge dates, insurance authorizations, work-related documents and needs from the treatment team regarding medications. Client stated needs and engaged in discussion. Case Manager reminded of contact and crisis numbers, and plan for program coverage next week. Case Manager introduced a new Client to the group, with group members welcoming and starting the joining process.   Initial Therapeutic Activity: Counselor facilitated a check-in with group members to assess mood and current functioning. Counselor facilitated group discussion on following topics, sharing coping skills and strategies to address: discharge preparedness, grief and loss, overspending, and engaging in hobbies/interests. Client engaged in discussion and provided feedback for other group members. Client denied any current SI/HI/psychosis.   Second Therapeutic Activity: Counselor provided psychoeducation on the 5 Love Languages and the importance of how we receive and give love to others. Counselor shared the link for group members to take the love language quiz. Group members took the assessment and shared their results. Client identified Words of Affirmation as their love language. Counselor discussed application of aspects of the 5 Love languages and  encouraged group members to complete assessments, utilize resources with partners. Client engaged well in discussion.    Check Out: Counselor closed program by allowing time to celebrate a graduating group member. Counselor shared reflections on progress and allow space for group members to share well wishes and appreciates to the graduating client. Counselor prompted graduating client to share takeaways, reflect on progress and final thoughts for the group. Client shared about skills learned and shifts in perspective on the grieving process. Client endorsed safety plan to be followed to prevent safety issues.   Assessment and Plan: Clinician recommends that Client remain in IOP treatment to better manage mental health symptoms, stabilization and to address treatment plan goals. Clinician recommends adherence to crisis/safety plan, taking medications as prescribed, and following up with medical professionals if any issues arise.   Follow Up Instructions: Clinician will send Webex link for next session. The Client was advised to call back or seek an in-person evaluation if the symptoms worsen or if the condition fails to improve as anticipated.     I provided 180 minutes of non-face-to-face time during this encounter.     Hilbert Odor, LCSW

## 2020-04-27 NOTE — Progress Notes (Signed)
Virtual Visit via Video Note  I connected with Kathleen Odonnell on @TODAY @ at  9:00 AM EDT by a video enabled telemedicine application and verified that I am speaking with the correct person using two identifiers.  Location: Patient: at home Provider: at office   I discussed the limitations of evaluation and management by telemedicine and the availability of in person appointments. The patient expressed understanding and agreed to proceed.  I discussed the assessment and treatment plan with the patient. The patient was provided an opportunity to ask questions and all were answered. The patient agreed with the plan and demonstrated an understanding of the instructions.   The patient was advised to call back or seek an in-person evaluation if the symptoms worsen or if the condition fails to improve as anticipated.  I provided 20 minutes of non-face-to-face time during this encounter.   , Kathleen Odonnell, M.Ed, CNA   Patient ID: Kathleen Odonnell, female   DOB: 05/18/1970, 50 y.o.   MRN: [id number]  D:  This is a 50 yr old married, employed, 44 American female who was referred per therapist [place] Page, [facility]); treatment for worsening depressive and anxiety symptoms.  Pt was previously in MH-IOP Oct. 2019 d/t panic attacks and depression.  Triggers:  1) Unresolved grief/loss issues:  Four deaths within two months in her family (ie. husband's niece and nephew, pt's cousin and her great uncle.  "It is just too much at one time."  3) Caregiver for mother.  Pt's mother dx'd with breast cancer and her surgery is 04-03-20.  Pt denies any prior psychiatric inpt admissions.  Has only seen 09-11-1972, [facility] for one visit.  PCP has been managing her medications.  Admits to one previous suicide attempt in 1990 (OD).  Denies any SI/HI or A/V hallucinations.  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  Family hx:  Maternal aunt and Paternal cousin (Bipolar  D/O).  Current Symptoms/Problems: Sadness, tearful, irritable, poor sleep (3-4 hrs), poor self-esteem, isolative, anhedonia, indecisiveness, racing thoughts, poor appetite, feelings of hopelessness and helplessness   Pt attended 15 days in MH-IOP.  Reports overall mood has improved.  States she's not struggling with anything.  On a scale of 1-10 (10 being the worst); pt rates her anxiety and depression at a 3. Denies SI/HI or A/V hallucinations.  States she has learned effective coping skills that she can apply.  Looking forward to renewing her vows.  A:  D/C today.  F/U with Dr. [place] on 04-29-20 @ 4 pm; pt to call her therapist 08-18-1995, [facility]) for a f/u appt.  Strongly encouraged support groups.  Pt to continue working part-time hrs thru 05-15-20 and return to full duties on 05-18-20.  R:  Pt receptive.   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

## 2020-04-28 ENCOUNTER — Ambulatory Visit (HOSPITAL_COMMUNITY): Payer: BC Managed Care – PPO

## 2020-04-28 NOTE — Progress Notes (Signed)
Virtual Visit via Telephone Note  I connected with Kathleen Odonnell on 04/28/20 at  9:00 AM EDT by telephone and verified that I am speaking with the correct person using two identifiers.  Location: Patient: home Provider: office   I discussed the limitations, risks, security and privacy concerns of performing an evaluation and management service by telephone and the availability of in person appointments. I also discussed with the patient that there may be a patient responsible charge related to this service. The patient expressed understanding and agreed to proceed.    I discussed the assessment and treatment plan with the patient. The patient was provided an opportunity to ask questions and all were answered. The patient agreed with the plan and demonstrated an understanding of the instructions.   The patient was advised to call back or seek an in-person evaluation if the symptoms worsen or if the condition fails to improve as anticipated.  I provided 15 minutes of non-face-to-face time during this encounter.   Kathleen Rack, NP   [facility] Discharge Summary  Kathleen Odonnell [id number]  Admission date: 04/08/2020 Discharge date: 04/28/2020  Reason for admission: Per admission assessment note: [name] -Kathleen Odonnell is a 50 year old African-American female that presents due to worsening depression and anxiety.  Reports she was referred by her therapist.  Reported diagnosis with posttraumatic stress disorder and major depression.  She reports multiple family members passed away in a short amount of time.  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   Selen Reports anhedonia, poor concentration, mood irritability and social isolation.  Reported she hasn't been resting " soundly." stated she is prescribed Trazodone, Abilify and Celexa. Will  make hydroxyzine available PRN. Patient was receptive to plan.  Patient reports    Progress in Program Toward Treatment Goals: Ongoing, Kathleen Odonnell attended and participated with daily group session with active and engaged participation.  Denying suicidal or homicidal ideations.  Denies auditory or visual hallucinations.  Patient reports follow-up appointment with attending psychiatrist on 4/27 denied any medication concerns/refills.  She reports her mood had not improved since attending group sessions.  Patient to continue medications as indicated.  Support encouragement reassurance was provided.  Progress (rationale): Keep follow- up with MD Arfeen on 04/29/2020 at 1600 and  Kathleen Odonnell therapy services.  Take all medications as prescribed. Keep all follow-up appointments as scheduled.  Do not consume alcohol or use illegal drugs while on prescription medications. Report any adverse effects from your medications to your primary care provider promptly.  In the event of recurrent symptoms or worsening symptoms, call 911, a crisis hotline, or go to the nearest emergency department for evaluation.   Kathleen Dowson NP  04/28/2020

## 2020-04-29 ENCOUNTER — Encounter (HOSPITAL_COMMUNITY): Payer: Self-pay | Admitting: Psychiatry

## 2020-04-29 ENCOUNTER — Telehealth (INDEPENDENT_AMBULATORY_CARE_PROVIDER_SITE_OTHER): Payer: BC Managed Care – PPO | Admitting: Psychiatry

## 2020-04-29 ENCOUNTER — Other Ambulatory Visit: Payer: Self-pay

## 2020-04-29 ENCOUNTER — Ambulatory Visit (HOSPITAL_COMMUNITY): Payer: BC Managed Care – PPO

## 2020-04-29 VITALS — Wt 219.0 lb

## 2020-04-29 DIAGNOSIS — F4310 Post-traumatic stress disorder, unspecified: Secondary | ICD-10-CM | POA: Diagnosis not present

## 2020-04-29 DIAGNOSIS — F331 Major depressive disorder, recurrent, moderate: Secondary | ICD-10-CM | POA: Diagnosis not present

## 2020-04-29 MED ORDER — HYDROXYZINE HCL 25 MG PO TABS: 25.0000 mg | ORAL_TABLET | Freq: Every evening | ORAL | 1 refills | Status: DC | PRN

## 2020-04-29 MED ORDER — TRAZODONE HCL 100 MG PO TABS: 200.0000 mg | ORAL_TABLET | Freq: Every day | ORAL | 0 refills | Status: DC

## 2020-04-29 MED ORDER — ARIPIPRAZOLE 5 MG PO TABS: ORAL_TABLET | ORAL | 0 refills | Status: DC

## 2020-04-29 MED ORDER — CITALOPRAM HYDROBROMIDE 40 MG PO TABS: 40.0000 mg | ORAL_TABLET | Freq: Every day | ORAL | 0 refills | Status: DC
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
## 2020-04-30 ENCOUNTER — Ambulatory Visit (HOSPITAL_COMMUNITY): Payer: BC Managed Care – PPO

## 2020-05-01 ENCOUNTER — Ambulatory Visit (HOSPITAL_COMMUNITY): Payer: BC Managed Care – PPO

## 2020-05-04 ENCOUNTER — Ambulatory Visit (HOSPITAL_COMMUNITY): Payer: BC Managed Care – PPO

## 2020-05-05 ENCOUNTER — Ambulatory Visit (HOSPITAL_COMMUNITY): Payer: BC Managed Care – PPO

## 2020-05-06 ENCOUNTER — Ambulatory Visit (HOSPITAL_COMMUNITY): Payer: BC Managed Care – PPO

## 2020-05-07 ENCOUNTER — Ambulatory Visit (HOSPITAL_COMMUNITY): Payer: BC Managed Care – PPO

## 2020-05-08 ENCOUNTER — Ambulatory Visit (HOSPITAL_COMMUNITY): Payer: BC Managed Care – PPO

## 2020-05-24 NOTE — Progress Notes (Signed)
Virtual Visit via Video Note  I connected with Kathleen Odonnell on 04/22/20 at  9:00 AM EDT by a video enabled telemedicine application and verified that I am speaking with the correct person using two identifiers.  Location: Patient: patient home Provider: clinical home office   At orientation to the IOP program, Case Managerdiscussed the limitations of evaluation and management by telemedicine and the availability of in person appointments. The patient expressed understanding and agreed to proceed with virtual visits throughout the duration of the program.  History of Present Illness: MDD   Observations/Objective: 9:00 - 10:00: Pharmacist, Verna Czech, led pharmacy group educating on medications and the role medication plays in mental wellness.  Pt participated in group.   10:00 - 11:00: Clinician led check-in regarding current stressors and situation.Clinician utilized active listening and empathetic response and validated patient emotions. Clinician facilitated processing group on pertinent issues.  Patient arrived within time allowed and reports that she is feeling "okay." Patient rates hermood at [facility] a scale of 1-10 with 10 being great. Pt reports she spent yesterday relaxing and is preparing to go back to her normal routine. Pt reports anxiety regarding returning to work. Pt denies SI/HI. Pt able to process. Pt engaged in discussion.    11:00 - 12:00: Guest speaker, J. Athas, led discussion on wellness and how we can support our mental health by engaging in overall wellness practices. Pt participated in group.  At check-out, cln prompted group members to utilize skills and engage in self-care during their afternoon. Pt denies SI/HI and will follow safety plan should safety issues arise.  Assessment and Plan: Clinician recommends that Client remain in IOP treatment to better manage mental health symptoms, stabilization and to address treatment plan goals. Clinician recommends  adherence to crisis/safety plan, taking medications as prescribed, and following up with medical professionals if any issues arise.  Follow Up Instructions: Clinician will send Webex link for next session. The Client was advised to call back or seek an in-person evaluation if the symptoms worsen or if the condition fails to improve as anticipated.    I provided 180 minutes of non-face-to-face time during this encounter.   Donia Guiles, LCSW

## 2020-05-24 NOTE — Progress Notes (Signed)
Virtual Visit via Video Note  I connected with Kathleen Odonnell on 04/20/20 at  9:00 AM EDT by a video enabled telemedicine application and verified that I am speaking with the correct person using two identifiers.  Location: Patient: patient home Provider: clinical home office   At orientation to the IOP program, Case Managerdiscussed the limitations of evaluation and management by telemedicine and the availability of in person appointments. The patient expressed understanding and agreed to proceed with virtual visits throughout the duration of the program.  History of Present Illness: MDD   Observations/Objective: 9:00 - 10:00: Clinician led check-in regarding current stressors and situation.Clinician utilized active listening and empathetic response and validated patient emotions. Clinician facilitated processing group on pertinent issues.  Patient arrived within time allowed and reports that she is feeling "good." Patient rates hermood at [facility] a scale of 1-10 with 10 being great. [place]. Pt states trying to focus on self-care. Pt denies SI/HI. Pt able to process. Pt engaged in discussion.   10:00 - 11:00: Cln facilitated processing group around difficult relationships. Group members shared struggles they face in relationships. Cln brought in topics of self-esteem, CBT thought challenging, boundaries, and communication to aid growth. Pt engaged in discussion and is able to process.    11:00 - 12:00: Cln introduced topic of healthy relationships. Cln discussed the way in which self-esteem can affect what we accept in relationships. Cln emphasized focusing on actions rather than words with those we are in relationship with. Group members shared ways in which their self-esteem has affected relationship dynamics.  Pt engaged in discussion and is able to process and gain insight.   At check-out, cln prompted group members to utilize skills and  engage in self-care during their afternoon. Pt denies SI/HI and will follow safety plan should safety issues arise.  Assessment and Plan: Clinician recommends that Client remain in IOP treatment to better manage mental health symptoms, stabilization and to address treatment plan goals. Clinician recommends adherence to crisis/safety plan, taking medications as prescribed, and following up with medical professionals if any issues arise.  Follow Up Instructions: Clinician will send Webex link for next session. The Client was advised to call back or seek an in-person evaluation if the symptoms worsen or if the condition fails to improve as anticipated.    I provided 180 minutes of non-face-to-face time during this encounter.   Donia Guiles, LCSW

## 2020-05-24 NOTE — Progress Notes (Signed)
Virtual Visit via Video Note  I connected with Kathleen Odonnell on 04/21/20 at  9:00 AM EDT by a video enabled telemedicine application and verified that I am speaking with the correct person using two identifiers.  Location: Patient: patient home Provider: clinical home office   At orientation to the IOP program, Case Managerdiscussed the limitations of evaluation and management by telemedicine and the availability of in person appointments. The patient expressed understanding and agreed to proceed with virtual visits throughout the duration of the program.  History of Present Illness: MDD   Observations/Objective: 9:00 - 10:00: Clinician led check-in regarding current stressors and situation.Clinician utilized active listening and empathetic response and validated patient emotions. Clinician facilitated processing group on pertinent issues.  Patient arrived within time allowed and reports that she is feeling "sleepy." Patient rates hermood at [facility] a scale of 1-10 with 10 being great. Pt reports she binged tv yesterday because it rained. Pt reports her mood was "great" and she was able to enjoy the downtime and stay focused. Pt denies SI/HI. Pt able to process. Pt engaged in discussion.   10:00 - 11:00: Cln introduced topic of boundaries. Cln discussed how boundaries inform our relationships and affect self-esteem and personal agency. Group discussed the three types of boundaries: rigid, porous, and healthy and when each type is most helpful/harmful.  Pt engaged in discussion and reports having mostly porous boundaries.    11:00 - 12:00: Cln continued topic of boundaries. Cln discussed the different ways boundaries present: physical, emotional, intellectual, sexual, material, and time. Group talked about the ways in which each type presents for them and is a struggle.  Pt engaged in discussion and reports most issue with time boundaries.   At check-out, cln prompted group members to  utilize skills and engage in self-care during their afternoon. Pt denies SI/HI and will follow safety plan should safety issues arise.  Assessment and Plan: Clinician recommends that Client remain in IOP treatment to better manage mental health symptoms, stabilization and to address treatment plan goals. Clinician recommends adherence to crisis/safety plan, taking medications as prescribed, and following up with medical professionals if any issues arise.  Follow Up Instructions: Clinician will send Webex link for next session. The Client was advised to call back or seek an in-person evaluation if the symptoms worsen or if the condition fails to improve as anticipated.    I provided 180 minutes of non-face-to-face time during this encounter.   Donia Guiles, LCSW

## 2020-05-24 NOTE — Progress Notes (Signed)
Virtual Visit via Video Note  I connected with Kathleen Odonnell on 04/24/20 at  9:00 AM EDT by a video enabled telemedicine application and verified that I am speaking with the correct person using two identifiers.  Location: Patient: patient home Provider: clinical home office   At orientation to the IOP program, Case Managerdiscussed the limitations of evaluation and management by telemedicine and the availability of in person appointments. The patient expressed understanding and agreed to proceed with virtual visits throughout the duration of the program.  History of Present Illness: MDD   Observations/Objective: 9:00 - 10:00: Clinician led check-in regarding current stressors and situation.Clinician utilized active listening and empathetic response and validated patient emotions. Clinician facilitated processing group on pertinent issues.  Patient arrived within time allowed and reports that she is feeling "bummed." Patient rates hermood at [facility] a scale of 1-10 with 10 being great. Pt reports she slept well last night. Pt reports continued stress and anxiety regarding going back to work on Monday. Pt denies SI/HI. Pt able to process. Pt engaged in discussion.   10:00 - 11:00: Cln led discussion on support groups and the role they can provide in recovery and ongoing treatment. Group discussed success and barriers they have had with past support groups or they have with considering support groups. Cln provided resources for local support groups.   Pt engaged in discussion and reports interest in engaging in support groups.   11:00 - 12:00: Cln continued topic of healthy relationships. Cln discussed the way in which self-esteem can affect what we accept in relationships. Cln emphasized focusing on actions rather than words with those we are in relationship with. Group members shared ways in which their self-esteem has affected relationship dynamics.  Pt engaged in discussion and is  able to process and gain insight.   At check-out, cln prompted group members to utilize skills and engage in self-care during their afternoon. Pt denies SI/HI and will follow safety plan should safety issues arise.  Assessment and Plan: Clinician recommends that Client remain in IOP treatment to better manage mental health symptoms, stabilization and to address treatment plan goals. Clinician recommends adherence to crisis/safety plan, taking medications as prescribed, and following up with medical professionals if any issues arise.  Follow Up Instructions: Clinician will send Webex link for next session. The Client was advised to call back or seek an in-person evaluation if the symptoms worsen or if the condition fails to improve as anticipated.    I provided 180 minutes of non-face-to-face time during this encounter.   Donia Guiles, LCSW

## 2020-05-24 NOTE — Progress Notes (Signed)
Virtual Visit via Video Note  I connected with Kathleen Odonnell on 04/23/20 at  9:00 AM EDT by a video enabled telemedicine application and verified that I am speaking with the correct person using two identifiers.  Location: Patient: patient home Provider: clinical home office   At orientation to the IOP program, Case Managerdiscussed the limitations of evaluation and management by telemedicine and the availability of in person appointments. The patient expressed understanding and agreed to proceed with virtual visits throughout the duration of the program.  History of Present Illness: MDD   Observations/Objective: 9:00 - 10:00: Clinician led check-in regarding current stressors and situation.Clinician utilized active listening and empathetic response and validated patient emotions. Clinician facilitated processing group on pertinent issues.  Patient arrived within time allowed and reports that she is feeling "bummed." Patient rates hermood at [facility] a scale of 1-10 with 10 being great. Pt reports struggling with anxiety regarding going back to work and how she will manage. Pt reports trying to look toeards positive events to manage. Pt denies SI/HI. Pt able to process. Pt engaged in discussion.   10:00 - 11:00: Cln led discussion on the 5 stages of grief and the way loss affects [place]. Cln encouraged pt to consider grief as a journey to accepting a new future. Cln created space for pt to process grief concerns and validated pt's experiences.  Pt engaged in discussion and was able to identify areas of loss and process.    11:00 - 12:00: Cln led activity on finding what makes you happy. Cln tasked group members to consider times in which they have been happy in the past, moments in which they have felt less distressed recently, and things that give them any rumblings of joy. Cln worked with pt's to process barriers and to consider one thing for now, not the bigger picture.  Pt engaged in  discussion. Pt able to process and come up with one thing thing to do to make herself happy.    At check-out, cln prompted group members to utilize skills and engage in self-care during their afternoon. Pt denies SI/HI and will follow safety plan should safety issues arise.  Assessment and Plan: Clinician recommends that Client remain in IOP treatment to better manage mental health symptoms, stabilization and to address treatment plan goals. Clinician recommends adherence to crisis/safety plan, taking medications as prescribed, and following up with medical professionals if any issues arise.  Follow Up Instructions: Clinician will send Webex link for next session. The Client was advised to call back or seek an in-person evaluation if the symptoms worsen or if the condition fails to improve as anticipated.    I provided 180 minutes of non-face-to-face time during this encounter.   Donia Guiles, LCSW

## 2020-07-29 ENCOUNTER — Encounter (HOSPITAL_COMMUNITY): Payer: Self-pay | Admitting: Psychiatry

## 2020-07-29 ENCOUNTER — Telehealth (INDEPENDENT_AMBULATORY_CARE_PROVIDER_SITE_OTHER): Payer: BC Managed Care – PPO | Admitting: Psychiatry

## 2020-07-29 ENCOUNTER — Other Ambulatory Visit: Payer: Self-pay

## 2020-07-29 DIAGNOSIS — F331 Major depressive disorder, recurrent, moderate: Secondary | ICD-10-CM

## 2020-07-29 DIAGNOSIS — F4310 Post-traumatic stress disorder, unspecified: Secondary | ICD-10-CM

## 2020-07-29 MED ORDER — ARIPIPRAZOLE 5 MG PO TABS: ORAL_TABLET | ORAL | 0 refills | Status: DC

## 2020-07-29 MED ORDER — CITALOPRAM HYDROBROMIDE 40 MG PO TABS: 40.0000 mg | ORAL_TABLET | Freq: Every day | ORAL | 0 refills | Status: DC

## 2020-07-29 MED ORDER — TRAZODONE HCL 100 MG PO TABS: 200.0000 mg | ORAL_TABLET | Freq: Every day | ORAL | 0 refills | Status: DC
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
## 2020-09-29 ENCOUNTER — Other Ambulatory Visit: Payer: Self-pay | Admitting: Sports Medicine

## 2020-09-29 ENCOUNTER — Ambulatory Visit
Admission: RE | Admit: 2020-09-29 | Discharge: 2020-09-29 | Disposition: A | Discharge status: Home / Self Care | Payer: BC Managed Care – PPO | Source: Ambulatory Visit | Attending: Sports Medicine | Admitting: Sports Medicine

## 2020-09-29 DIAGNOSIS — M25561 Pain in right knee: Secondary | ICD-10-CM

## 2020-09-29 DIAGNOSIS — M25562 Pain in left knee: Secondary | ICD-10-CM

## 2020-10-29 ENCOUNTER — Telehealth (HOSPITAL_BASED_OUTPATIENT_CLINIC_OR_DEPARTMENT_OTHER): Payer: BC Managed Care – PPO | Admitting: Psychiatry

## 2020-10-29 ENCOUNTER — Other Ambulatory Visit: Payer: Self-pay

## 2020-10-29 ENCOUNTER — Encounter (HOSPITAL_COMMUNITY): Payer: Self-pay | Admitting: Psychiatry

## 2020-10-29 DIAGNOSIS — F4310 Post-traumatic stress disorder, unspecified: Secondary | ICD-10-CM | POA: Diagnosis not present

## 2020-10-29 DIAGNOSIS — F331 Major depressive disorder, recurrent, moderate: Secondary | ICD-10-CM

## 2020-10-29 MED ORDER — TRAZODONE HCL 100 MG PO TABS: 200.0000 mg | ORAL_TABLET | Freq: Every day | ORAL | 0 refills | Status: DC

## 2020-10-29 MED ORDER — CITALOPRAM HYDROBROMIDE 40 MG PO TABS: 40.0000 mg | ORAL_TABLET | Freq: Every day | ORAL | 0 refills | Status: DC

## 2020-10-29 MED ORDER — ARIPIPRAZOLE 5 MG PO TABS: ORAL_TABLET | ORAL | 0 refills | Status: DC
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
## 2020-12-04 ENCOUNTER — Other Ambulatory Visit: Payer: Self-pay | Admitting: Family Medicine

## 2020-12-04 DIAGNOSIS — Z1231 Encounter for screening mammogram for malignant neoplasm of breast: Secondary | ICD-10-CM

## 2021-01-27 ENCOUNTER — Encounter (HOSPITAL_COMMUNITY): Payer: Self-pay | Admitting: Psychiatry

## 2021-01-27 ENCOUNTER — Telehealth (HOSPITAL_BASED_OUTPATIENT_CLINIC_OR_DEPARTMENT_OTHER): Payer: BC Managed Care – PPO | Admitting: Psychiatry

## 2021-01-27 ENCOUNTER — Other Ambulatory Visit: Payer: Self-pay

## 2021-01-27 DIAGNOSIS — F331 Major depressive disorder, recurrent, moderate: Secondary | ICD-10-CM | POA: Diagnosis not present

## 2021-01-27 DIAGNOSIS — F4310 Post-traumatic stress disorder, unspecified: Secondary | ICD-10-CM

## 2021-01-27 MED ORDER — CITALOPRAM HYDROBROMIDE 40 MG PO TABS: 40.0000 mg | ORAL_TABLET | Freq: Every day | ORAL | 0 refills | Status: DC

## 2021-01-27 MED ORDER — ARIPIPRAZOLE 5 MG PO TABS: ORAL_TABLET | ORAL | 0 refills | Status: DC

## 2021-01-27 MED ORDER — TRAZODONE HCL 100 MG PO TABS: 200.0000 mg | ORAL_TABLET | Freq: Every day | ORAL | 0 refills | Status: DC
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
## 2021-03-01 DIAGNOSIS — Z0289 Encounter for other administrative examinations: Secondary | ICD-10-CM

## 2021-03-04 ENCOUNTER — Ambulatory Visit (INDEPENDENT_AMBULATORY_CARE_PROVIDER_SITE_OTHER): Payer: BC Managed Care – PPO | Admitting: Bariatrics

## 2021-03-04 ENCOUNTER — Other Ambulatory Visit: Payer: Self-pay

## 2021-03-04 ENCOUNTER — Encounter (INDEPENDENT_AMBULATORY_CARE_PROVIDER_SITE_OTHER): Payer: Self-pay | Admitting: Bariatrics

## 2021-03-04 VITALS — BP 112/71 | HR 74 | Temp 98.0°F | Ht 59.0 in | Wt 214.0 lb

## 2021-03-04 DIAGNOSIS — Z1331 Encounter for screening for depression: Secondary | ICD-10-CM

## 2021-03-04 DIAGNOSIS — R7309 Other abnormal glucose: Secondary | ICD-10-CM

## 2021-03-04 DIAGNOSIS — R5383 Other fatigue: Secondary | ICD-10-CM | POA: Diagnosis not present

## 2021-03-04 DIAGNOSIS — K5909 Other constipation: Secondary | ICD-10-CM

## 2021-03-04 DIAGNOSIS — E668 Other obesity: Secondary | ICD-10-CM

## 2021-03-04 DIAGNOSIS — R0602 Shortness of breath: Secondary | ICD-10-CM

## 2021-03-04 DIAGNOSIS — E65 Localized adiposity: Secondary | ICD-10-CM

## 2021-03-04 DIAGNOSIS — E559 Vitamin D deficiency, unspecified: Secondary | ICD-10-CM | POA: Diagnosis not present

## 2021-03-04 DIAGNOSIS — E7800 Pure hypercholesterolemia, unspecified: Secondary | ICD-10-CM

## 2021-03-04 DIAGNOSIS — Z6841 Body Mass Index (BMI) 40.0 and over, adult: Secondary | ICD-10-CM

## 2021-03-04 DIAGNOSIS — E66813 Obesity, class 3: Secondary | ICD-10-CM

## 2021-03-04 NOTE — Progress Notes (Signed)
Chief Complaint:   OBESITY Kathleen Odonnell (MR# [id number]) is a 51 y.o. female who presents for evaluation and treatment of obesity and related comorbidities. Current BMI is Body mass index is 43.22 kg/m. Kathleen Odonnell has been struggling with her weight for many years and has been unsuccessful in either losing weight, maintaining weight loss, or reaching her healthy weight goal.  Kathleen Odonnell states that she does not like to cook. She considers herself to be a picky eater.   Kathleen Odonnell is currently in the action stage of change and ready to dedicate time achieving and maintaining a healthier weight. Kathleen Odonnell is interested in becoming our patient and working on intensive lifestyle modifications including (but not limited to) diet and exercise for weight loss.  Kathleen Odonnell's habits were reviewed today and are as follows: she thinks her family will eat healthier with her, her desired weight loss is 34 pounds, she has been heavy most of her life, she started gaining weight after last child, her heaviest weight ever was 220 pounds, she is a picky eater and doesn't like to eat healthier foods, she has significant food cravings issues, she snacks frequently in the evenings, she skips meals frequently, she is frequently drinking liquids with calories, she frequently makes poor food choices, she frequently eats larger portions than normal, and she struggles with emotional eating.  Depression Screen Kathleen Odonnell's Food and Mood (modified PHQ-9) score was 14.  Depression screen PHQ 2/9 03/04/2021  Decreased Interest 1  Down, Depressed, Hopeless 1  PHQ - 2 Score 2  Altered sleeping 0  Tired, decreased energy 3  Change in appetite 3  Feeling bad or failure about yourself  0  Trouble concentrating 3  Moving slowly or fidgety/restless 3  Suicidal thoughts 0  PHQ-9 Score 14  Some encounter information is confidential and restricted. Go to Review Flowsheets activity to see all data.   Subjective:   1. Other fatigue Kathleen Odonnell  will continue activities. Kathleen Odonnell reports daytime somnolence and reports waking up still tired. Patient has a history of symptoms of morning fatigue. Kathleen Odonnell generally gets 8 hours of sleep per night, and states that she has difficulty falling asleep. Snoring is present. Apneic episodes are not present. Epworth Sleepiness Score is 4.    2. SOB (shortness of breath) Kathleen Odonnell will continue activities. Kathleen Odonnell notes increasing shortness of breath with exercising and seems to be worsening over time with weight gain. She notes getting out of breath sooner with activity than she used to. This has not gotten worse recently. Kathleen Odonnell denies shortness of breath at rest or orthopnea.   3. Other constipation Kathleen Odonnell is currently taking Miralax every day.   4. Vitamin D deficiency Kathleen Odonnell is taking Vitamin D 2000 IU.  5. Elevated glucose Kathleen Odonnell is not on medications currently.   6. Elevated cholesterol We discussed cholesterol today.   7. Visceral obesity Kathleen Odonnell's visceral fat was 16.  Assessment/Plan:   1. Other fatigue Kathleen Odonnell will gradually increase activities. We will check TSH today. Kathleen Odonnell does feel that her weight is causing her energy to be lower than it should be. Fatigue may be related to obesity, depression or many other causes. Labs will be ordered, and in the meanwhile, Kathleen Odonnell will focus on self care including making healthy food choices, increasing physical activity and focusing on stress reduction.   - EKG 12-Lead - TSH+T4F+T3Free  2. SOB (shortness of breath) Kathleen Odonnell will gradually increase activities. We will check TSH today. Kathleen Odonnell does feel that she gets out of breath  more easily that she used to when she exercises. Kathleen Odonnell's shortness of breath appears to be obesity related and exercise induced. She has agreed to work on weight loss and gradually increase exercise to treat her exercise induced shortness of breath. Will continue to monitor closely.   - TSH+T4F+T3Free  3. Other constipation Kathleen Odonnell will  increase her water intake to at least 64 ounces. She will take Milk of Magnesium if needed. She use Fleet suppositories as needed. She was informed that a decrease in bowel movement frequency is normal while losing weight, but stools should not be hard or painful. Orders and follow up as documented in patient record.   Counseling Getting to Good Bowel Health: Your goal is to have one soft bowel movement each day. Drink at least 8 glasses of water each day. Eat plenty of fiber (goal is over 25 grams each day). It is best to get most of your fiber from dietary sources which includes leafy green vegetables, fresh fruit, and whole grains. You may need to add fiber with the help of OTC fiber supplements. These include Metamucil, Citrucel, and Flaxseed. If you are still having trouble, try adding Miralax or Magnesium Citrate. If all of these changes do not work, Dietitian.   4. Vitamin D deficiency Low Vitamin D level contributes to fatigue and are associated with obesity, breast, and colon cancer. We will check Vitamin D today and Kathleen Odonnell will follow-up for routine testing of Vitamin D, at least 2-3 times per year to avoid over-replacement.  - VITAMIN D 25 Hydroxy (Vit-D Deficiency, Fractures)  5. Elevated glucose We will check A1C and insulin today.   - Comprehensive metabolic panel - Insulin, random - Hemoglobin A1c  6. Elevated cholesterol We will check CMP and Lipid panel today.   - Comprehensive metabolic panel - Lipid Panel With LDL/HDL Ratio  7. Visceral obesity Kathleen Odonnell will work on diet and exercise.   8. Depression screen Kathleen Odonnell had a negative depression screening. Depression is commonly associated with obesity and often results in emotional eating behaviors. We will monitor this closely and work on CBT to help improve the non-hunger eating patterns. Referral to Psychology may be required if no improvement is seen as she continues in our clinic.   9. 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 I recommend Kathleen Odonnell begin the structured treatment plan as follows:  She has agreed to the Category 2 Plan.  Kathleen Odonnell will avoid all sugary drinks. She will have zero snacks after dinner except approved snacks. "[employer] " handout was provided today.   Exercise goals:  Kathleen Odonnell does 5,000 steps at work.     Behavioral modification strategies: increasing lean protein intake, decreasing simple carbohydrates, increasing vegetables, increasing water intake, decreasing eating out, no skipping meals, meal planning and cooking strategies, keeping healthy foods in the home, and planning for success.  She was informed of the importance of frequent follow-up visits to maximize her success with intensive lifestyle modifications for her multiple health conditions. She was informed we would discuss her lab results at her next visit unless there is a critical issue that needs to be addressed sooner. Kathleen Odonnell agreed to keep her next visit at the agreed upon time to discuss these results.  Objective:   Blood pressure 112/71, pulse 74,  temperature 98 F (36.7 C), height 4\' 11"  (1.499 m), weight 214 lb (97.1 kg), last menstrual period 02/15/2021, SpO2 98 %. Body mass index is 43.22 kg/m.  EKG: Normal sinus rhythm, rate 70 bpm.  Indirect Calorimeter completed today shows a VO2 of 250 and a REE of 1728.  Her calculated basal metabolic rate is 8546 thus her basal metabolic rate is better than expected.  General: Cooperative, alert, well developed, in no acute distress. HEENT: Conjunctivae and lids unremarkable. Cardiovascular: Regular rhythm.  Lungs: Normal work of breathing. Neurologic: No focal deficits.   No results found for: CREATININE, BUN, NA, K, CL, CO2 No results found for: ALT, AST, GGT, ALKPHOS,  BILITOT No results found for: HGBA1C No results found for: INSULIN No results found for: TSH No results found for: CHOL, HDL, LDLCALC, LDLDIRECT, TRIG, CHOLHDL Lab Results  Component Value Date   WBC 12.6 (H) 06/28/2007   HGB 8.9 DELTA CHECK NOTED (L) 06/28/2007   HCT 26.4 (L) 06/28/2007   MCV 85.3 06/28/2007   PLT 201 06/28/2007   No results found for: IRON, TIBC, FERRITIN  Attestation Statements:   Reviewed by clinician on day of visit: allergies, medications, problem list, medical history, surgical history, family history, social history, and previous encounter notes.  I, Jackson Latino, RMA, am acting as Energy manager for [employer], DO.  I have reviewed the above documentation for accuracy and completeness, and I agree with the above. Corinna Capra, DO

## 2021-03-05 LAB — COMPREHENSIVE METABOLIC PANEL
ALT: 9 IU/L (ref 0–32)
AST: 15 IU/L (ref 0–40)
Albumin/Globulin Ratio: 1 — ABNORMAL LOW (ref 1.2–2.2)
Albumin: 3.9 g/dL (ref 3.8–4.9)
Alkaline Phosphatase: 85 IU/L (ref 44–121)
BUN/Creatinine Ratio: 11 (ref 9–23)
BUN: 9 mg/dL (ref 6–24)
Bilirubin Total: 0.3 mg/dL (ref 0.0–1.2)
CO2: 25 mmol/L (ref 20–29)
Calcium: 9 mg/dL (ref 8.7–10.2)
Chloride: 102 mmol/L (ref 96–106)
Creatinine, Ser: 0.84 mg/dL (ref 0.57–1.00)
Globulin, Total: 3.8 g/dL (ref 1.5–4.5)
Glucose: 92 mg/dL (ref 70–99)
Potassium: 3.9 mmol/L (ref 3.5–5.2)
Sodium: 142 mmol/L (ref 134–144)
Total Protein: 7.7 g/dL (ref 6.0–8.5)
eGFR: 84 mL/min/{1.73_m2} (ref >59)

## 2021-03-05 LAB — INSULIN, RANDOM: INSULIN: 11.4 u[IU]/mL (ref 2.6–24.9)

## 2021-03-05 LAB — VITAMIN D 25 HYDROXY (VIT D DEFICIENCY, FRACTURES): Vit D, 25-Hydroxy: 43.6 ng/mL (ref 30.0–100.0)

## 2021-03-05 LAB — TSH+T4F+T3FREE
Free T4: 1.1 ng/dL (ref 0.82–1.77)
T3, Free: 2.5 pg/mL (ref 2.0–4.4)
TSH: 1.86 u[IU]/mL (ref 0.450–4.500)

## 2021-03-05 LAB — HEMOGLOBIN A1C
Est. average glucose Bld gHb Est-mCnc: 120 mg/dL
Hgb A1c MFr Bld: 5.8 % — ABNORMAL HIGH (ref 4.8–5.6)

## 2021-03-05 LAB — LIPID PANEL WITH LDL/HDL RATIO
Cholesterol, Total: 203 mg/dL — ABNORMAL HIGH (ref 100–199)
HDL: 48 mg/dL (ref >39)
LDL Chol Calc (NIH): 137 mg/dL — ABNORMAL HIGH (ref 0–99)
LDL/HDL Ratio: 2.9 ratio (ref 0.0–3.2)
Triglycerides: 99 mg/dL (ref 0–149)
VLDL Cholesterol Cal: 18 mg/dL (ref 5–40)

## 2021-03-07 ENCOUNTER — Encounter (INDEPENDENT_AMBULATORY_CARE_PROVIDER_SITE_OTHER): Payer: Self-pay | Admitting: Bariatrics

## 2021-03-07 DIAGNOSIS — F411 Generalized anxiety disorder: Secondary | ICD-10-CM | POA: Insufficient documentation

## 2021-03-07 DIAGNOSIS — E7800 Pure hypercholesterolemia, unspecified: Secondary | ICD-10-CM | POA: Insufficient documentation

## 2021-03-07 DIAGNOSIS — R7303 Prediabetes: Secondary | ICD-10-CM | POA: Insufficient documentation

## 2021-03-07 DIAGNOSIS — F322 Major depressive disorder, single episode, severe without psychotic features: Secondary | ICD-10-CM | POA: Insufficient documentation

## 2021-03-07 DIAGNOSIS — F410 Panic disorder [episodic paroxysmal anxiety] without agoraphobia: Secondary | ICD-10-CM | POA: Insufficient documentation

## 2021-03-07 DIAGNOSIS — J309 Allergic rhinitis, unspecified: Secondary | ICD-10-CM | POA: Insufficient documentation

## 2021-03-07 DIAGNOSIS — M75111 Incomplete rotator cuff tear or rupture of right shoulder, not specified as traumatic: Secondary | ICD-10-CM | POA: Insufficient documentation

## 2021-03-08 ENCOUNTER — Encounter (INDEPENDENT_AMBULATORY_CARE_PROVIDER_SITE_OTHER): Payer: Self-pay | Admitting: Bariatrics

## 2021-03-22 ENCOUNTER — Other Ambulatory Visit: Payer: Self-pay

## 2021-03-22 ENCOUNTER — Encounter (INDEPENDENT_AMBULATORY_CARE_PROVIDER_SITE_OTHER): Payer: Self-pay | Admitting: Bariatrics

## 2021-03-22 ENCOUNTER — Ambulatory Visit (INDEPENDENT_AMBULATORY_CARE_PROVIDER_SITE_OTHER): Payer: BC Managed Care – PPO | Admitting: Bariatrics

## 2021-03-22 VITALS — BP 118/71 | HR 81 | Temp 97.9°F | Ht 59.0 in | Wt 211.0 lb

## 2021-03-22 DIAGNOSIS — E669 Obesity, unspecified: Secondary | ICD-10-CM | POA: Diagnosis not present

## 2021-03-22 DIAGNOSIS — D500 Iron deficiency anemia secondary to blood loss (chronic): Secondary | ICD-10-CM | POA: Diagnosis not present

## 2021-03-22 DIAGNOSIS — R7303 Prediabetes: Secondary | ICD-10-CM

## 2021-03-22 DIAGNOSIS — Z6841 Body Mass Index (BMI) 40.0 and over, adult: Secondary | ICD-10-CM

## 2021-03-22 DIAGNOSIS — K5900 Constipation, unspecified: Secondary | ICD-10-CM | POA: Diagnosis not present

## 2021-03-22 MED ORDER — POLYSACCHARIDE IRON COMPLEX 150 MG PO CAPS: 150.0000 mg | ORAL_CAPSULE | Freq: Every day | ORAL | 0 refills | Status: DC

## 2021-03-22 MED ORDER — WEGOVY 0.25 MG/0.5ML ~~LOC~~ SOAJ: 0.2500 mg | SUBCUTANEOUS | 0 refills | Status: DC

## 2021-03-23 ENCOUNTER — Telehealth: Payer: Self-pay | Admitting: Hematology and Oncology

## 2021-03-23 NOTE — Telephone Encounter (Signed)
Scheduled appt per 3/20 referral. Pt is aware of appt date and time. Pt is aware to arrive 15 mins prior to appt time and to bring and updated insurance card. Pt is aware of appt location.   ?

## 2021-03-24 ENCOUNTER — Encounter (INDEPENDENT_AMBULATORY_CARE_PROVIDER_SITE_OTHER): Payer: Self-pay | Admitting: Bariatrics

## 2021-03-24 NOTE — Progress Notes (Signed)
? ? ? ?Chief Complaint:  ? ?OBESITY ?Kathleen Odonnell is here to discuss her progress with her obesity treatment plan along with follow-up of her obesity related diagnoses. [name] is on the Category 2 Plan and states she is following her eating plan. [name] states she is doing 0 minutes 0 times per week. ? ?Today's visit was #: 2 ?Starting weight: 214 lbs ?Starting date: 03/04/2021 ?Today's weight: 211 lbs ?Today's date: 03/22/2021 ?Total lbs lost to date: 3 lbs ?Total lbs lost since last in-office visit: 3 lbs ? ?Interim History: [name] is down 3 lb since her last visit. She struggled when at a conference and going out to eat but at home she was okay. ? ?Subjective:  ? ?1. Iron deficiency anemia due to chronic blood loss ?Kathleen Odonnell has heavy periods.  ? ?2. Pre-diabetes ?Kathleen Odonnell has a history of pre-diabetes on maternal family. She is not on contraindications. Her last A1C was 5.8. ? ?3. Constipation, unspecified constipation type ?Kathleen Odonnell is currently taking Miralax. ? ?Assessment/Plan:  ? ?1. Iron deficiency anemia due to chronic blood loss ?Orders and follow up as documented in patient record. [name] was provided a referral to the hematologist  for evaluation. We will refill Nu-iron 150 mg for 1 month with no refills.  ? ?Counseling ?Iron is essential for our bodies to make red blood cells.  Reasons that someone may be deficient include: an iron-deficient diet (more likely in those following vegan or vegetarian diets), women with heavy menses, patients with GI disorders or poor absorption, patients that have had bariatric surgery, frequent blood donors, patients with cancer, and patients with heart disease.   ?An iron supplement has been recommended. This is found over-the-counter.  ?Iron-rich foods include dark leafy greens, red and white meats, eggs, seafood, and beans.   ?Certain foods and drinks prevent your body from absorbing iron properly. Avoid eating these foods in the same meal as iron-rich foods or with iron supplements.  These foods include: coffee, black tea, and red wine; milk, dairy products, and foods that are high in calcium; beans and soybeans; whole grains.  ?Constipation can be a side effect of iron supplementation. Increased water and fiber intake are helpful. Water goal: > 2 liters/day. Fiber goal: > 25 grams/day.  ?- iron polysaccharides (NU-IRON) 150 MG capsule; Take 1 capsule (150 mg total) by mouth daily.  Dispense: 30 capsule; Refill: 0 ?- Ambulatory referral to Hematology / Oncology ? ?2. Pre-diabetes ?[name] was provided handouts for insulin resistance and pre-diabetes. We will refill Wegovy 0.25 mg for 1 month with no refills (pre-approved). She will continue to work on weight loss, exercise, and decreasing simple carbohydrates to help decrease the risk of diabetes.  ? ?- Semaglutide-Weight Management (WEGOVY) 0.25 MG/0.5ML SOAJ; Inject 0.25 mg into the skin once a week.  Dispense: 2 mL; Refill: 0 ? ?3. Constipation, unspecified constipation type ?Kathleen Odonnell will continue Miralax. She will add stool softener (Colace). She will use Milk of Magnesium and Ducolax over the counter if needed. She was informed that a decrease in bowel movement frequency is normal while losing weight, but stools should not be hard or painful. Orders and follow up as documented in patient record.  ? ?Counseling ?Getting to Good Bowel Health: Your goal is to have one soft bowel movement each day. Drink at least 8 glasses of water each day. Eat plenty of fiber (goal is over 25 grams each day). It is best to get most of your fiber from dietary sources which includes leafy green vegetables,  fresh fruit, and whole grains. You may need to add fiber with the help of OTC fiber supplements. These include Metamucil, Citrucel, and Flaxseed. If you are still having trouble, try adding Miralax or Magnesium Citrate. If all of these changes do not work, Dietitian.  ? ?4. Obesity, current BMI 42.7 ?Kathleen Odonnell is currently in the action stage of change.  As such, her goal is to continue with weight loss efforts. She has agreed to the Category 2 Plan.  ? ?Kathleen Odonnell will continue meal planning and she will continue intentional eating. We reviewed labs from 03/04/2021 CMP, Lipid, Vitamin D, A1C, insulin, and thyroid panel.  ? ?Exercise goals: No exercise has been prescribed at this time. ? ?Behavioral modification strategies: increasing lean protein intake, decreasing simple carbohydrates, increasing vegetables, increasing water intake, decreasing eating out, no skipping meals, meal planning and cooking strategies, keeping healthy foods in the home, and planning for success. ? ?Kathleen Odonnell has agreed to follow-up with our clinic in 2 weeks with [name], FNP or [name], PA-C. She was informed of the importance of frequent follow-up visits to maximize her success with intensive lifestyle modifications for her multiple health conditions.  ? ?Objective:  ? ?Blood pressure 118/71, pulse 81, temperature 97.9 ?F (36.6 ?C), height 4\' 11"  (1.499 m), weight 211 lb (95.7 kg), SpO2 100 %. ?Body mass index is 42.62 kg/m?. ? ?General: Cooperative, alert, well developed, in no acute distress. ?HEENT: Conjunctivae and lids unremarkable. ?Cardiovascular: Regular rhythm.  ?Lungs: Normal work of breathing. ?Neurologic: No focal deficits.  ? ?Lab Results  ?Component Value Date  ? CREATININE 0.84 03/04/2021  ? BUN 9 03/04/2021  ? NA 142 03/04/2021  ? K 3.9 03/04/2021  ? CL 102 03/04/2021  ? CO2 25 03/04/2021  ? ?Lab Results  ?Component Value Date  ? ALT 9 03/04/2021  ? AST 15 03/04/2021  ? ALKPHOS 85 03/04/2021  ? BILITOT 0.3 03/04/2021  ? ?Lab Results  ?Component Value Date  ? HGBA1C 5.8 (H) 03/04/2021  ? ?Lab Results  ?Component Value Date  ? INSULIN 11.4 03/04/2021  ? ?Lab Results  ?Component Value Date  ? TSH 1.860 03/04/2021  ? ?Lab Results  ?Component Value Date  ? CHOL 203 (H) 03/04/2021  ? HDL 48 03/04/2021  ? LDLCALC 137 (H) 03/04/2021  ? TRIG 99 03/04/2021  ? ?Lab Results   ?Component Value Date  ? VD25OH 43.6 03/04/2021  ? ?Lab Results  ?Component Value Date  ? WBC 12.6 (H) 06/28/2007  ? HGB 8.9 DELTA CHECK NOTED (L) 06/28/2007  ? HCT 26.4 (L) 06/28/2007  ? MCV 85.3 06/28/2007  ? PLT 201 06/28/2007  ? ?No results found for: IRON, TIBC, FERRITIN ? ?Attestation Statements:  ? ?Reviewed by clinician on day of visit: allergies, medications, problem list, medical history, surgical history, family history, social history, and previous encounter notes. ? ?I, 06/30/2007, RMA, am acting as transcriptionist for Jackson Latino, DO. ? ?I have reviewed the above documentation for accuracy and completeness, and I agree with the above. [employer], DO ? ?

## 2021-03-25 ENCOUNTER — Encounter (INDEPENDENT_AMBULATORY_CARE_PROVIDER_SITE_OTHER): Payer: Self-pay

## 2021-03-25 ENCOUNTER — Telehealth (INDEPENDENT_AMBULATORY_CARE_PROVIDER_SITE_OTHER): Payer: Self-pay | Admitting: Bariatrics

## 2021-03-25 NOTE — Telephone Encounter (Signed)
Prior authorization approved for [facility]. Effective: 03/24/2021 - 10/24/2021. Patient sent approval message via mychart.  ?

## 2021-04-01 NOTE — Progress Notes (Signed)
?TeleHealth Visit:  ?Due to the COVID-19 pandemic, this visit was completed with telemedicine (audio/video) technology to reduce patient and provider exposure as well as to preserve personal protective equipment.  ? ?Kathleen Odonnell has verbally consented to this TeleHealth visit. The patient is located at home, the provider is located at home. The participants in this visit include the listed provider and patient. The visit was conducted today via MyChart video. ? ?OBESITY ?[name] is here to discuss her progress with her obesity treatment plan along with follow-up of her obesity related diagnoses.  ? ?Today's visit was # 3 ?Starting weight: 214 lbs ?Starting date: 03/04/21 ?Total weight loss: 3 lbs at last in office visit (03/22/21) ?Weight at last in office visit: 211 lb ? ? ?Nutrition Plan: the Category 2 Plan.  ?Hunger is well controlled. Cravings are well controlled.  ?Current exercise: none ? ?Interim History: [name] has been able to adhere to the plan but has been struggling to eat all of the prescribed food.  She has been eating her protein first and has been able to eat all of the protein.  ?Of note: She prefers in office visits. ? ?Assessment/Plan:  ?1. Prediabetes ?[name] has a diagnosis of prediabetes based on her elevated HgA1c. ?She denies polyphagia. ?Medication(s): Wegovy 0.25 mg started at last office visit.  She reports mild nausea.  She has chronic constipation but says this has not worsened. ?Lab Results  ?Component Value Date  ? HGBA1C 5.8 (H) 03/04/2021  ? ?Lab Results  ?Component Value Date  ? INSULIN 11.4 03/04/2021  ? ? ?Plan: ?Continue Wegovy 0.25 mg weekly. ? ?2. Iron deficiency anemia ?Anemia is not stable.  I do not have access to a current CBC for her.  She has heavy periods.   She notes fatigue.  ?She was referred to hematology by Dr. Manson Passey.  She was scheduled for an appointment March 31 but she canceled this.  She says she is afraid to get an iron infusion because her mom had an adverse  reaction to it. ?Iron supplementation: Nu-iron 150 mg daily.  ?Lab Results  ?Component Value Date  ? WBC 12.6 (H) 06/28/2007  ? HGB 8.9 DELTA CHECK NOTED (L) 06/28/2007  ? HCT 26.4 (L) 06/28/2007  ? MCV 85.3 06/28/2007  ? PLT 201 06/28/2007  ? ?No results found for: IRON, TIBC, FERRITIN  ? ?Plan: ?Continue supplementation at current dose. ?I strongly encouraged her to reschedule her appointment and discuss her concerns with the provider.  I have advised her that the majority of people do very well with iron infusions and it would improve her fatigue. ? ?3. Obesity: Current BMI 42.59 ?[name] is currently in the action stage of change. As such, her goal is to continue with weight loss efforts. She has agreed to the Category 2 Plan.  ? ?Exercise goals: No exercise has been prescribed at this time. ? ? ?Behavioral modification strategies: increasing lean protein intake and increasing water intake. ? ?[name] has agreed to follow-up with our clinic in 2 weeks.  ? ?No orders of the defined types were placed in this encounter. ? ? ?There are no discontinued medications.  ? ?No orders of the defined types were placed in this encounter. ?   ? ?Objective:  ? ?VITALS: Per patient if applicable, see vitals. ?GENERAL: Alert and in no acute distress. ?CARDIOPULMONARY: No increased WOB. Speaking in clear sentences.  ?PSYCH: Pleasant and cooperative. Speech normal rate and rhythm. Affect is appropriate. Insight and judgement are appropriate. Attention is focused,  linear, and appropriate.  ?NEURO: Oriented as arrived to appointment on time with no prompting.  ? ?Lab Results  ?Component Value Date  ? CREATININE 0.84 03/04/2021  ? BUN 9 03/04/2021  ? NA 142 03/04/2021  ? K 3.9 03/04/2021  ? CL 102 03/04/2021  ? CO2 25 03/04/2021  ? ?Lab Results  ?Component Value Date  ? ALT 9 03/04/2021  ? AST 15 03/04/2021  ? ALKPHOS 85 03/04/2021  ? BILITOT 0.3 03/04/2021  ? ?Lab Results  ?Component Value Date  ? HGBA1C 5.8 (H) 03/04/2021  ? ?Lab  Results  ?Component Value Date  ? INSULIN 11.4 03/04/2021  ? ?Lab Results  ?Component Value Date  ? TSH 1.860 03/04/2021  ? ?Lab Results  ?Component Value Date  ? CHOL 203 (H) 03/04/2021  ? HDL 48 03/04/2021  ? LDLCALC 137 (H) 03/04/2021  ? TRIG 99 03/04/2021  ? ?Lab Results  ?Component Value Date  ? WBC 12.6 (H) 06/28/2007  ? HGB 8.9 DELTA CHECK NOTED (L) 06/28/2007  ? HCT 26.4 (L) 06/28/2007  ? MCV 85.3 06/28/2007  ? PLT 201 06/28/2007  ? ?No results found for: IRON, TIBC, FERRITIN ?Lab Results  ?Component Value Date  ? VD25OH 43.6 03/04/2021  ? ? ?Attestation Statements:  ? ?Reviewed by clinician on day of visit: allergies, medications, problem list, medical history, surgical history, family history, social history, and previous encounter notes. ? ?Time spent on visit including pre-visit chart review and post-visit charting and care was 31 minutes.  ? ? ?

## 2021-04-02 ENCOUNTER — Ambulatory Visit
Admission: RE | Admit: 2021-04-02 | Discharge: 2021-04-02 | Disposition: A | Discharge status: Home / Self Care | Payer: BC Managed Care – PPO | Source: Ambulatory Visit | Attending: Family Medicine | Admitting: Family Medicine

## 2021-04-02 ENCOUNTER — Other Ambulatory Visit: Payer: Self-pay

## 2021-04-02 ENCOUNTER — Encounter: Payer: Self-pay | Admitting: Hematology and Oncology

## 2021-04-02 DIAGNOSIS — Z1231 Encounter for screening mammogram for malignant neoplasm of breast: Secondary | ICD-10-CM

## 2021-04-05 ENCOUNTER — Telehealth (INDEPENDENT_AMBULATORY_CARE_PROVIDER_SITE_OTHER): Payer: BC Managed Care – PPO | Admitting: Family Medicine

## 2021-04-05 ENCOUNTER — Encounter (INDEPENDENT_AMBULATORY_CARE_PROVIDER_SITE_OTHER): Payer: Self-pay | Admitting: Family Medicine

## 2021-04-05 DIAGNOSIS — Z6841 Body Mass Index (BMI) 40.0 and over, adult: Secondary | ICD-10-CM | POA: Diagnosis not present

## 2021-04-05 DIAGNOSIS — R7303 Prediabetes: Secondary | ICD-10-CM

## 2021-04-05 DIAGNOSIS — D500 Iron deficiency anemia secondary to blood loss (chronic): Secondary | ICD-10-CM | POA: Diagnosis not present

## 2021-04-05 DIAGNOSIS — E669 Obesity, unspecified: Secondary | ICD-10-CM | POA: Diagnosis not present

## 2021-04-14 ENCOUNTER — Other Ambulatory Visit (INDEPENDENT_AMBULATORY_CARE_PROVIDER_SITE_OTHER): Payer: Self-pay | Admitting: Bariatrics

## 2021-04-14 DIAGNOSIS — R7303 Prediabetes: Secondary | ICD-10-CM

## 2021-04-19 ENCOUNTER — Ambulatory Visit (INDEPENDENT_AMBULATORY_CARE_PROVIDER_SITE_OTHER): Payer: BC Managed Care – PPO | Admitting: Bariatrics

## 2021-04-19 ENCOUNTER — Encounter (INDEPENDENT_AMBULATORY_CARE_PROVIDER_SITE_OTHER): Payer: Self-pay | Admitting: Bariatrics

## 2021-04-19 VITALS — BP 113/75 | HR 91 | Temp 98.1°F | Ht 59.0 in | Wt 204.0 lb

## 2021-04-19 DIAGNOSIS — E669 Obesity, unspecified: Secondary | ICD-10-CM | POA: Diagnosis not present

## 2021-04-19 DIAGNOSIS — R7303 Prediabetes: Secondary | ICD-10-CM | POA: Diagnosis not present

## 2021-04-19 DIAGNOSIS — K5909 Other constipation: Secondary | ICD-10-CM

## 2021-04-19 DIAGNOSIS — E65 Localized adiposity: Secondary | ICD-10-CM

## 2021-04-19 DIAGNOSIS — Z6841 Body Mass Index (BMI) 40.0 and over, adult: Secondary | ICD-10-CM

## 2021-04-19 MED ORDER — WEGOVY 0.5 MG/0.5ML ~~LOC~~ SOAJ: 0.5000 mg | SUBCUTANEOUS | 0 refills | Status: DC

## 2021-04-27 ENCOUNTER — Telehealth (HOSPITAL_BASED_OUTPATIENT_CLINIC_OR_DEPARTMENT_OTHER): Payer: BC Managed Care – PPO | Admitting: Psychiatry

## 2021-04-27 ENCOUNTER — Encounter (HOSPITAL_COMMUNITY): Payer: Self-pay | Admitting: Psychiatry

## 2021-04-27 DIAGNOSIS — F331 Major depressive disorder, recurrent, moderate: Secondary | ICD-10-CM | POA: Diagnosis not present

## 2021-04-27 DIAGNOSIS — F4310 Post-traumatic stress disorder, unspecified: Secondary | ICD-10-CM

## 2021-04-27 MED ORDER — ARIPIPRAZOLE 5 MG PO TABS: ORAL_TABLET | ORAL | 0 refills | Status: DC

## 2021-04-27 MED ORDER — CITALOPRAM HYDROBROMIDE 40 MG PO TABS: 40.0000 mg | ORAL_TABLET | Freq: Every day | ORAL | 0 refills | Status: DC

## 2021-04-27 MED ORDER — TRAZODONE HCL 100 MG PO TABS
200.0000 mg | ORAL_TABLET | Freq: Every day | ORAL | 0 refills | Status: DC
Start: 2021-04-27 — End: 2021-07-27
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
## 2021-04-29 ENCOUNTER — Encounter (INDEPENDENT_AMBULATORY_CARE_PROVIDER_SITE_OTHER): Payer: Self-pay | Admitting: Bariatrics

## 2021-04-29 NOTE — Progress Notes (Signed)
? ? ? ?Chief Complaint:  ? ?OBESITY ?Katrin is here to discuss her progress with her obesity treatment plan along with follow-up of her obesity related diagnoses. Tyniah is on the Category 2 Plan and states she is following her eating plan approximately 50% of the time. Joceline states she is doing 0 minutes 0 times per week. ? ?Today's visit was #: 4 ?Starting weight: 214 lbs ?Starting date: 03/04/2021 ?Today's weight: 204 lbs ?Today's date: 04/19/2021 ?Total lbs lost to date: 10 lbs ?Total lbs lost since last in-office visit: 7 lbs ? ?Interim History: Sina is down an additional 7 lbs and doing well overall.  ? ?Subjective:  ? ?1. Pre-diabetes ?Maecyn is currently taking Wegovy.  ? ?2. Visceral obesity ?Iveliz's Visceral obesity fat rating was 15. It had been 16. ? ?3. Other constipation ?Zariel is currently taking Milk of Magnesium if needed and Miralax.  ? ?Assessment/Plan:  ? ?1. Pre-diabetes ?We will refill Wegovy 0.5 mg for 1 month with  no refills. Haruka will continue to work on weight loss, exercise, and decreasing simple carbohydrates to help decrease the risk of diabetes.  ? ?- Semaglutide-Weight Management (WEGOVY) 0.5 MG/0.5ML SOAJ; Inject 0.5 mg into the skin once a week.  Dispense: 2 mL; Refill: 0 ? ?2. Visceral obesity ?Jaeliana will continue to work with the plan and exercise.  ? ?3. Other constipation ?Ryhanna was informed that a decrease in bowel movement frequency is normal while losing weight, but stools should not be hard or painful. Meleni will continue Miralax. Orders and follow up as documented in patient record.  ? ?Counseling ?Getting to Good Bowel Health: Your goal is to have one soft bowel movement each day. Drink at least 8 glasses of water each day. Eat plenty of fiber (goal is over 25 grams each day). It is best to get most of your fiber from dietary sources which includes leafy green vegetables, fresh fruit, and whole grains. You may need to add fiber with the help of OTC fiber supplements. These  include Metamucil, Citrucel, and Flaxseed. If you are still having trouble, try adding Miralax or Magnesium Citrate. If all of these changes do not work, Dietitian.  ? ?4. Obesity, current BMI 41.3 ?Fianna is currently in the action stage of change. As such, her goal is to continue with weight loss efforts. She has agreed to the Category 2 Plan.  ? ?Britain will continue meal planning and she will continue intentional eating. She will increase eating, water and protein.  ? ?Exercise goals: No exercise has been prescribed at this time. ? ?Behavioral modification strategies: increasing lean protein intake, decreasing simple carbohydrates, increasing vegetables, increasing water intake, decreasing eating out, no skipping meals, meal planning and cooking strategies, keeping healthy foods in the home, and planning for success. ? ?Kaidynce has agreed to follow-up with our clinic in 2-3 weeks with nurse practitioner. She was informed of the importance of frequent follow-up visits to maximize her success with intensive lifestyle modifications for her multiple health conditions.  ? ?Objective:  ? ?Blood pressure 113/75, pulse 91, temperature 98.1 ?F (36.7 ?C), height 4\' 11"  (1.499 m), weight 204 lb (92.5 kg), SpO2 99 %. ?Body mass index is 41.2 kg/m?. ? ?General: Cooperative, alert, well developed, in no acute distress. ?HEENT: Conjunctivae and lids unremarkable. ?Cardiovascular: Regular rhythm.  ?Lungs: Normal work of breathing. ?Neurologic: No focal deficits.  ? ?Lab Results  ?Component Value Date  ? CREATININE 0.84 03/04/2021  ? BUN 9 03/04/2021  ? NA  142 03/04/2021  ? K 3.9 03/04/2021  ? CL 102 03/04/2021  ? CO2 25 03/04/2021  ? ?Lab Results  ?Component Value Date  ? ALT 9 03/04/2021  ? AST 15 03/04/2021  ? ALKPHOS 85 03/04/2021  ? BILITOT 0.3 03/04/2021  ? ?Lab Results  ?Component Value Date  ? HGBA1C 5.8 (H) 03/04/2021  ? ?Lab Results  ?Component Value Date  ? INSULIN 11.4 03/04/2021  ? ?Lab Results  ?Component  Value Date  ? TSH 1.860 03/04/2021  ? ?Lab Results  ?Component Value Date  ? CHOL 203 (H) 03/04/2021  ? HDL 48 03/04/2021  ? LDLCALC 137 (H) 03/04/2021  ? TRIG 99 03/04/2021  ? ?Lab Results  ?Component Value Date  ? VD25OH 43.6 03/04/2021  ? ?Lab Results  ?Component Value Date  ? WBC 12.6 (H) 06/28/2007  ? HGB 8.9 DELTA CHECK NOTED (L) 06/28/2007  ? HCT 26.4 (L) 06/28/2007  ? MCV 85.3 06/28/2007  ? PLT 201 06/28/2007  ? ?No results found for: IRON, TIBC, FERRITIN ? ?Attestation Statements:  ? ?Reviewed by clinician on day of visit: allergies, medications, problem list, medical history, surgical history, family history, social history, and previous encounter notes. ? ?I, Jackson Latino, RMA, am acting as transcriptionist for [employer], DO. ? ?I have reviewed the above documentation for accuracy and completeness, and I agree with the above. Corinna Capra, DO ? ?

## 2021-05-03 NOTE — Progress Notes (Addendum)
TeleHealth Visit:  This visit was completed with telemedicine (audio/video) technology. Kathleen Odonnell has verbally consented to this TeleHealth visit. The patient is located at home, the provider is located at home. The participants in this visit include the listed provider and patient. The visit was conducted today via MyChart video.  OBESITY Kathleen Odonnell is here to discuss her progress with her obesity treatment plan along with follow-up of her obesity related diagnoses.   Today's visit was # 5 Starting weight: 214 lbs Starting date: 03/04/2021 Weight at last in office visit: 204 lbs on 04/19/21 Total weight loss: 10 lbs at last in office visit on 04/19/21. Today's reported weight: 203 lbs   Nutrition Plan: the Category 2 Plan.  Hunger is well controlled. Cravings are well controlled.  Current exercise: none  Interim History: Kathleen Odonnell has done well with the meal plan over the past few weeks.  Her hunger is satisfied.  Sometimes she is unable to eat all of the food.  She is having a sandwich with a protein drink at lunch but is unable to finish all of the sandwich.  She usually has rice cakes with peanut butter and Yasso bars for a snack.  At dinner she is able to eat all of her protein but does not always eat all of the vegetables. 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 She has not yet started to exercise but plans on starting soon.  Assessment/Plan:  1. Prediabetes Kathleen Odonnell has a diagnosis of prediabetes based on her elevated HgA1c. She denies polyphagia. Medication(s): Wegovy 0.5 mg weekly.  She has had some mild nausea. Lab Results  Component Value Date   HGBA1C 5.8 (H) 03/04/2021   Lab Results  Component Value Date   INSULIN 11.4 03/04/2021    Plan: Refill Wegovy 0.5 mg weekly  2. Iron deficiency anemia I do not have recent lab work.  Referral was made to hematology/oncology on March 20 by Dr. Corinna Capra.  Appointment was made with Dr.  Georgiann Mohs for March 21 but the patient canceled her appointment. Kathleen Odonnell notes she has not had a period since February.  She reports heavy periods generally.  Cologuard screening is up-to-date. Iron supplementation: Iron 325 mg by mouth daily Lab Results  Component Value Date   WBC 12.6 (H) 06/28/2007   HGB 8.9 DELTA CHECK NOTED (L) 06/28/2007   HCT 26.4 (L) 06/28/2007   MCV 85.3 06/28/2007   PLT 201 06/28/2007   No results found for: IRON, TIBC, FERRITIN   Plan: Continue supplementation at current dose. Refill new iron. Recheck anemia panel and CBC at next lab appointment.  3.  Other constipation She has been using milk of magnesia with less than optimal results.  Water intake is good.  She reports constipation is a chronic issue for her.  Plan: Use MiraLAX daily.   Continue adequate water and fiber intake.  4.  Obesity: Current BMI 41.18 Kathleen Odonnell is currently in the action stage of change. As such, her goal is to continue with weight loss efforts.  She has agreed to the Category 2 Plan.   Exercise goals: She may begin exercise if she would like.  Behavioral modification strategies: increasing lean protein intake.  Kathleen Odonnell has agreed to follow-up with our clinic in 3 weeks.   No orders of the defined types were placed in this encounter.   Medications Discontinued During This Encounter  Medication Reason   Semaglutide-Weight Management (WEGOVY) 0.5 MG/0.5ML SOAJ Reorder  iron polysaccharides (NU-IRON) 150 MG capsule Reorder     Meds ordered this encounter  Medications   Semaglutide-Weight Management (WEGOVY) 0.5 MG/0.5ML SOAJ    Sig: Inject 0.5 mg into the skin once a week.    Dispense:  2 mL    Refill:  0    Order Specific Question:   Supervising Provider    Answer:   Quillian Quince D [[id number]]   iron polysaccharides (NU-IRON) 150 MG capsule    Sig: Take 1 capsule (150 mg total) by mouth daily.    Dispense:  30 capsule    Refill:  0    Order Specific Question:    Supervising Provider    Answer:   Quillian Quince D [[id number]]      Objective:   VITALS: Per patient if applicable, see vitals. GENERAL: Alert and in no acute distress. CARDIOPULMONARY: No increased WOB. Speaking in clear sentences.  PSYCH: Pleasant and cooperative. Speech normal rate and rhythm. Affect is appropriate. Insight and judgement are appropriate. Attention is focused, linear, and appropriate.  NEURO: Oriented as arrived to appointment on time with no prompting.   Lab Results  Component Value Date   CREATININE 0.84 03/04/2021   BUN 9 03/04/2021   NA 142 03/04/2021   K 3.9 03/04/2021   CL 102 03/04/2021   CO2 25 03/04/2021   Lab Results  Component Value Date   ALT 9 03/04/2021   AST 15 03/04/2021   ALKPHOS 85 03/04/2021   BILITOT 0.3 03/04/2021   Lab Results  Component Value Date   HGBA1C 5.8 (H) 03/04/2021   Lab Results  Component Value Date   INSULIN 11.4 03/04/2021   Lab Results  Component Value Date   TSH 1.860 03/04/2021   Lab Results  Component Value Date   CHOL 203 (H) 03/04/2021   HDL 48 03/04/2021   LDLCALC 137 (H) 03/04/2021   TRIG 99 03/04/2021   Lab Results  Component Value Date   WBC 12.6 (H) 06/28/2007   HGB 8.9 DELTA CHECK NOTED (L) 06/28/2007   HCT 26.4 (L) 06/28/2007   MCV 85.3 06/28/2007   PLT 201 06/28/2007   No results found for: IRON, TIBC, FERRITIN Lab Results  Component Value Date   VD25OH 43.6 03/04/2021    Attestation Statements:   Reviewed by clinician on day of visit: allergies, medications, problem list, medical history, surgical history, family history, social history, and previous encounter notes.

## 2021-05-04 ENCOUNTER — Ambulatory Visit (INDEPENDENT_AMBULATORY_CARE_PROVIDER_SITE_OTHER): Payer: BC Managed Care – PPO | Admitting: Bariatrics

## 2021-05-04 ENCOUNTER — Telehealth (INDEPENDENT_AMBULATORY_CARE_PROVIDER_SITE_OTHER): Payer: BC Managed Care – PPO | Admitting: Family Medicine

## 2021-05-04 ENCOUNTER — Encounter (INDEPENDENT_AMBULATORY_CARE_PROVIDER_SITE_OTHER): Payer: Self-pay | Admitting: Family Medicine

## 2021-05-04 DIAGNOSIS — D500 Iron deficiency anemia secondary to blood loss (chronic): Secondary | ICD-10-CM

## 2021-05-04 DIAGNOSIS — Z6841 Body Mass Index (BMI) 40.0 and over, adult: Secondary | ICD-10-CM

## 2021-05-04 DIAGNOSIS — E669 Obesity, unspecified: Secondary | ICD-10-CM

## 2021-05-04 DIAGNOSIS — R7303 Prediabetes: Secondary | ICD-10-CM | POA: Diagnosis not present

## 2021-05-04 DIAGNOSIS — K5909 Other constipation: Secondary | ICD-10-CM | POA: Diagnosis not present

## 2021-05-04 MED ORDER — POLYSACCHARIDE IRON COMPLEX 150 MG PO CAPS: 150.0000 mg | ORAL_CAPSULE | Freq: Every day | ORAL | 0 refills | Status: DC

## 2021-05-04 MED ORDER — WEGOVY 0.5 MG/0.5ML ~~LOC~~ SOAJ: 0.5000 mg | SUBCUTANEOUS | 0 refills | Status: DC

## 2021-05-27 ENCOUNTER — Ambulatory Visit (INDEPENDENT_AMBULATORY_CARE_PROVIDER_SITE_OTHER): Payer: BC Managed Care – PPO | Admitting: Bariatrics

## 2021-05-27 ENCOUNTER — Encounter (INDEPENDENT_AMBULATORY_CARE_PROVIDER_SITE_OTHER): Payer: Self-pay | Admitting: Bariatrics

## 2021-05-27 VITALS — BP 98/64 | HR 74 | Temp 97.8°F | Ht 59.0 in | Wt 198.0 lb

## 2021-05-27 DIAGNOSIS — R7303 Prediabetes: Secondary | ICD-10-CM | POA: Diagnosis not present

## 2021-05-27 DIAGNOSIS — K5909 Other constipation: Secondary | ICD-10-CM | POA: Diagnosis not present

## 2021-05-27 DIAGNOSIS — E669 Obesity, unspecified: Secondary | ICD-10-CM | POA: Diagnosis not present

## 2021-05-27 DIAGNOSIS — Z6841 Body Mass Index (BMI) 40.0 and over, adult: Secondary | ICD-10-CM

## 2021-05-27 DIAGNOSIS — D509 Iron deficiency anemia, unspecified: Secondary | ICD-10-CM | POA: Diagnosis not present

## 2021-06-02 ENCOUNTER — Encounter (INDEPENDENT_AMBULATORY_CARE_PROVIDER_SITE_OTHER): Payer: Self-pay | Admitting: Bariatrics

## 2021-06-02 MED ORDER — POLYSACCHARIDE IRON COMPLEX 150 MG PO CAPS: 150.0000 mg | ORAL_CAPSULE | Freq: Every day | ORAL | 0 refills | Status: DC

## 2021-06-02 MED ORDER — WEGOVY 0.5 MG/0.5ML ~~LOC~~ SOAJ: 0.5000 mg | SUBCUTANEOUS | 0 refills | Status: DC

## 2021-06-02 NOTE — Progress Notes (Signed)
Chief Complaint:   OBESITY Kathleen Odonnell is here to discuss her progress with her obesity treatment plan along with follow-up of her obesity related diagnoses. Kathleen Odonnell is on the Category 2 Plan and states she is following her eating plan approximately 80% of the time. Kathleen Odonnell states she is doing 0 minutes 0 times per week.  Today's visit was #: 6 Starting weight: 214 lbs Starting date: 03/04/2021 Today's weight: 198 lbs Today's date: 05/27/2021 Total lbs lost to date: 16 lbs Total lbs lost since last in-office visit: 6 lbs  Interim History: Kathleen Odonnell is down 6 lbs since her last visit. She has been sticking to the plan well.   Subjective:   1. Pre-diabetes Halli denies side effects except constipation.   2. Iron deficiency anemia, unspecified iron deficiency anemia type Kathleen Odonnell is taking her medication as directed.   3. Other constipation Kathleen Odonnell is using Milk of Magnesium.   Assessment/Plan:   1. Pre-diabetes Kathleen Odonnell will continue to work on weight loss, exercise, and decreasing simple carbohydrates to help decrease the risk of diabetes. We will refill Wegovy 0.5 mg for 1 month with no refills.   2. Iron deficiency anemia, unspecified iron deficiency anemia type Orders and follow up as documented in patient record. We will refill Nu-Iron 150 mg for 1 month with no refills.   Counseling Iron is essential for our bodies to make red blood cells.  Reasons that someone may be deficient include: an iron-deficient diet (more likely in those following vegan or vegetarian diets), women with heavy menses, patients with GI disorders or poor absorption, patients that have had bariatric surgery, frequent blood donors, patients with cancer, and patients with heart disease.   An iron supplement has been recommended. This is found over-the-counter. Iron-rich foods include dark leafy greens, red and white meats, eggs, seafood, and beans.   Certain foods and drinks prevent your body from absorbing iron properly.  Avoid eating these foods in the same meal as iron-rich foods or with iron supplements. These foods include: coffee, black tea, and red wine; milk, dairy products, and foods that are high in calcium; beans and soybeans; whole grains.  Constipation can be a side effect of iron supplementation. Increased water and fiber intake are helpful. Water goal: > 2 liters/day. Fiber goal: > 25 grams/day.   3. Other constipation Kathleen Odonnell was informed that a decrease in bowel movement frequency is normal while losing weight, but stools should not be hard or painful. Kathleen Odonnell agrees to start Miralax with water, Citrucel  and she will use occasional Milk of Magnesium. Orders and follow up as documented in patient record.   Counseling Getting to Good Bowel Health: Your goal is to have one soft bowel movement each day. Drink at least 8 glasses of water each day. Eat plenty of fiber (goal is over 25 grams each day). It is best to get most of your fiber from dietary sources which includes leafy green vegetables, fresh fruit, and whole grains. You may need to add fiber with the help of OTC fiber supplements. These include Metamucil, Citrucel, and Flaxseed. If you are still having trouble, try adding Miralax or Magnesium Citrate. If all of these changes do not work, Dietitian.   4. Obesity, Current BMI 40.1 Kathleen Odonnell is currently in the action stage of change. As such, her goal is to continue with weight loss efforts. She has agreed to the Category 2 Plan.   Kathleen Odonnell will continue meal planning and intentional eating. She will  increase her water intake.   Exercise goals:  Kathleen Odonnell will go to the gym and workout.   Behavioral modification strategies: increasing lean protein intake, decreasing simple carbohydrates, increasing vegetables, increasing water intake, decreasing eating out, no skipping meals, meal planning and cooking strategies, keeping healthy foods in the home, and planning for success.  Kathleen Odonnell has agreed to  follow-up with our clinic in 2 weeks with [name], FNP and 4-5 weeks with myself. She was informed of the importance of frequent follow-up visits to maximize her success with intensive lifestyle modifications for her multiple health conditions.   Objective:   Blood pressure 98/64, pulse 74, temperature 97.8 F (36.6 C), height 4\' 11"  (1.499 m), weight 198 lb (89.8 kg), SpO2 97 %. Body mass index is 39.99 kg/m.  General: Cooperative, alert, well developed, in no acute distress. HEENT: Conjunctivae and lids unremarkable. Cardiovascular: Regular rhythm.  Lungs: Normal work of breathing. Neurologic: No focal deficits.   Lab Results  Component Value Date   CREATININE 0.84 03/04/2021   BUN 9 03/04/2021   NA 142 03/04/2021   K 3.9 03/04/2021   CL 102 03/04/2021   CO2 25 03/04/2021   Lab Results  Component Value Date   ALT 9 03/04/2021   AST 15 03/04/2021   ALKPHOS 85 03/04/2021   BILITOT 0.3 03/04/2021   Lab Results  Component Value Date   HGBA1C 5.8 (H) 03/04/2021   Lab Results  Component Value Date   INSULIN 11.4 03/04/2021   Lab Results  Component Value Date   TSH 1.860 03/04/2021   Lab Results  Component Value Date   CHOL 203 (H) 03/04/2021   HDL 48 03/04/2021   LDLCALC 137 (H) 03/04/2021   TRIG 99 03/04/2021   Lab Results  Component Value Date   VD25OH 43.6 03/04/2021   Lab Results  Component Value Date   WBC 12.6 (H) 06/28/2007   HGB 8.9 DELTA CHECK NOTED (L) 06/28/2007   HCT 26.4 (L) 06/28/2007   MCV 85.3 06/28/2007   PLT 201 06/28/2007   No results found for: IRON, TIBC, FERRITIN  Attestation Statements:   Reviewed by clinician on day of visit: allergies, medications, problem list, medical history, surgical history, family history, social history, and previous encounter notes.  I, 06/30/2007, RMA, am acting as Jackson Latino for [employer], DO.  I have reviewed the above documentation for accuracy and completeness, and I agree with the  above. [employer], DO

## 2021-06-10 ENCOUNTER — Telehealth (INDEPENDENT_AMBULATORY_CARE_PROVIDER_SITE_OTHER): Payer: Self-pay | Admitting: Bariatrics

## 2021-06-10 NOTE — Telephone Encounter (Signed)
Pt calling stating she is on Wegovy and pharmacy is not having any in stock. Pt supposed to take medication on Friday but wanted to ask the provider what she needs to do since they do not have any in stock for her to get. The best call back number is [phone].  Last appt 5/25 with Manson Passey

## 2021-06-10 NOTE — Telephone Encounter (Signed)
Notified patient that she would have to call around to different pharmacies to see who has Wegovy since there is a Geneticist, molecular. Patient verbalized understanding.

## 2021-06-10 NOTE — Telephone Encounter (Signed)
Dr.Brown 

## 2021-06-15 NOTE — Progress Notes (Signed)
TeleHealth Visit:  This visit was completed with telemedicine (audio/video) technology. Kathleen Odonnell has verbally consented to this TeleHealth visit. The patient is located at home, the provider is located at home. The participants in this visit include the listed provider and patient. The visit was conducted today via MyChart video.  OBESITY Kathleen Odonnell is here to discuss her progress with her obesity treatment plan along with follow-up of her obesity related diagnoses.   Today's visit was # 7 Starting weight: 214 lbs Starting date: 03/04/2021 Weight at last in office visit: 198 lbs on 05/27/21 Total weight loss: 16 lbs at last in office visit on 05/27/21. Today's reported weight: 197 lbs   Nutrition Plan: the Category 2 Plan 50% adherence.  Hunger is poorly controlled. Cravings are  Current exercise:  squats yesterday. Plans on going to the gym now that it is summer.  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.  She is working half days until the end of June and then will have summer break. She has been unable to get [facility] for the past 2 weeks and notes increased hunger-especially at lunch.  Pharmacy says Reginal Lutes will be in within the next 2 days. She is on plan at breakfast and dinner but sometimes goes out for lunch because she does not feel the category 2 plan lunch will fill her up. Denies intake of caloric beverages.  Assessment/Plan:  1. Prediabetes Kathleen Odonnell has a diagnosis of prediabetes based on her elevated HgA1c. She admits to creased polyphagia recently. Medication(s): Wegovy 0.5 mg weekly.  Has not had a dose for 2 weeks.  When on the [facility] appetite was controlled and she tolerated it well. Lab Results  Component Value Date   HGBA1C 5.8 (H) 03/04/2021   Lab Results  Component Value Date   INSULIN 11.4 03/04/2021    Plan: Hopefully she will be able to get the [facility] within the next 2 weeks.  If not she will send a message and let me know-I  will try to get Ozempic covered for her.  2. Iron deficiency anemia I do not have recent lab work.  Referral was made to hematology/oncology on March 20 by Dr. Corinna Capra.  Appointment was made with Dr. Pamelia Hoit for March 21 but the patient canceled her appointment. Cologuard screening is up-to-date. Iron supplementation: Nu-Iron 325 mg by mouth daily   Iron supplementation: Iron 325 mg by mouth daily Lab Results  Component Value Date   WBC 12.6 (H) 06/28/2007   HGB 8.9 DELTA CHECK NOTED (L) 06/28/2007   HCT 26.4 (L) 06/28/2007   MCV 85.3 06/28/2007   PLT 201 06/28/2007   No results found for: "IRON", "TIBC", "FERRITIN"   Plan: Continue Nu-Iron 325 mg by mouth daily Check labs next office visit.   3. Obesity: Current BMI 39.9 Kathleen Odonnell is currently in the action stage of change. As such, her goal is to continue with weight loss efforts.  She has agreed to the Category 2 Plan.   Since she is experiencing hunger with lunch, may add 1 yogurt or 2 like cheese sticks to her lunch until she is able to get back on the [facility].  Exercise goals: Plans on going to the gym the summer 2 to 3 days/week and walking on the treadmill for 20 to 30 minutes, doing arms and abs on the machines.  Behavioral modification strategies: increasing lean protein intake, decreasing simple carbohydrates, and decreasing eating out.  Kathleen Odonnell has agreed to follow-up with our clinic in  2 weeks.   No orders of the defined types were placed in this encounter.   There are no discontinued medications.   No orders of the defined types were placed in this encounter.     Objective:   VITALS: Per patient if applicable, see vitals. GENERAL: Alert and in no acute distress. CARDIOPULMONARY: No increased WOB. Speaking in clear sentences.  PSYCH: Pleasant and cooperative. Speech normal rate and rhythm. Affect is appropriate. Insight and judgement are appropriate. Attention is focused, linear, and appropriate.  NEURO:  Oriented as arrived to appointment on time with no prompting.   Lab Results  Component Value Date   CREATININE 0.84 03/04/2021   BUN 9 03/04/2021   NA 142 03/04/2021   K 3.9 03/04/2021   CL 102 03/04/2021   CO2 25 03/04/2021   Lab Results  Component Value Date   ALT 9 03/04/2021   AST 15 03/04/2021   ALKPHOS 85 03/04/2021   BILITOT 0.3 03/04/2021   Lab Results  Component Value Date   HGBA1C 5.8 (H) 03/04/2021   Lab Results  Component Value Date   INSULIN 11.4 03/04/2021   Lab Results  Component Value Date   TSH 1.860 03/04/2021   Lab Results  Component Value Date   CHOL 203 (H) 03/04/2021   HDL 48 03/04/2021   LDLCALC 137 (H) 03/04/2021   TRIG 99 03/04/2021   Lab Results  Component Value Date   WBC 12.6 (H) 06/28/2007   HGB 8.9 DELTA CHECK NOTED (L) 06/28/2007   HCT 26.4 (L) 06/28/2007   MCV 85.3 06/28/2007   PLT 201 06/28/2007   No results found for: "IRON", "TIBC", "FERRITIN" Lab Results  Component Value Date   VD25OH 43.6 03/04/2021    Attestation Statements:   Reviewed by clinician on day of visit: allergies, medications, problem list, medical history, surgical history, family history, social history, and previous encounter notes.  Time spent on visit including pre-visit chart review and post-visit charting and care was 31 minutes.  This time included: -preparing to see the patient (e.g., review of tests) -obtaining and/or reviewing separately obtained history -performing a medically appropriate examination and/or evaluation -counseling and educating the patient/family/caregiver

## 2021-06-16 ENCOUNTER — Encounter (INDEPENDENT_AMBULATORY_CARE_PROVIDER_SITE_OTHER): Payer: Self-pay | Admitting: Family Medicine

## 2021-06-16 ENCOUNTER — Telehealth (INDEPENDENT_AMBULATORY_CARE_PROVIDER_SITE_OTHER): Payer: BC Managed Care – PPO | Admitting: Family Medicine

## 2021-06-16 DIAGNOSIS — D509 Iron deficiency anemia, unspecified: Secondary | ICD-10-CM | POA: Diagnosis not present

## 2021-06-16 DIAGNOSIS — Z6839 Body mass index (BMI) 39.0-39.9, adult: Secondary | ICD-10-CM | POA: Diagnosis not present

## 2021-06-16 DIAGNOSIS — Z7985 Long-term (current) use of injectable non-insulin antidiabetic drugs: Secondary | ICD-10-CM

## 2021-06-16 DIAGNOSIS — R7303 Prediabetes: Secondary | ICD-10-CM

## 2021-06-16 DIAGNOSIS — E669 Obesity, unspecified: Secondary | ICD-10-CM

## 2021-06-16 DIAGNOSIS — Z6841 Body Mass Index (BMI) 40.0 and over, adult: Secondary | ICD-10-CM

## 2021-06-18 ENCOUNTER — Encounter (INDEPENDENT_AMBULATORY_CARE_PROVIDER_SITE_OTHER): Payer: Self-pay | Admitting: Family Medicine

## 2021-06-18 DIAGNOSIS — R7303 Prediabetes: Secondary | ICD-10-CM

## 2021-06-21 MED ORDER — OZEMPIC (0.25 OR 0.5 MG/DOSE) 2 MG/3ML ~~LOC~~ SOPN: 0.5000 mg | PEN_INJECTOR | SUBCUTANEOUS | 0 refills | Status: DC

## 2021-06-22 ENCOUNTER — Encounter (INDEPENDENT_AMBULATORY_CARE_PROVIDER_SITE_OTHER): Payer: Self-pay

## 2021-06-22 ENCOUNTER — Telehealth (INDEPENDENT_AMBULATORY_CARE_PROVIDER_SITE_OTHER): Payer: Self-pay | Admitting: Family Medicine

## 2021-06-22 NOTE — Telephone Encounter (Signed)
Dawn [employer] - Prior authorization not required for [employer]. Patient sent message via mychart.

## 2021-06-28 ENCOUNTER — Ambulatory Visit (INDEPENDENT_AMBULATORY_CARE_PROVIDER_SITE_OTHER): Payer: BC Managed Care – PPO | Admitting: Bariatrics

## 2021-06-28 ENCOUNTER — Encounter (INDEPENDENT_AMBULATORY_CARE_PROVIDER_SITE_OTHER): Payer: Self-pay | Admitting: Bariatrics

## 2021-06-28 VITALS — BP 95/63 | HR 80 | Temp 97.8°F | Ht 59.0 in | Wt 193.0 lb

## 2021-06-28 DIAGNOSIS — R7303 Prediabetes: Secondary | ICD-10-CM

## 2021-06-28 DIAGNOSIS — E7800 Pure hypercholesterolemia, unspecified: Secondary | ICD-10-CM | POA: Diagnosis not present

## 2021-06-28 DIAGNOSIS — D508 Other iron deficiency anemias: Secondary | ICD-10-CM

## 2021-06-28 DIAGNOSIS — Z7985 Long-term (current) use of injectable non-insulin antidiabetic drugs: Secondary | ICD-10-CM

## 2021-06-28 DIAGNOSIS — E669 Obesity, unspecified: Secondary | ICD-10-CM | POA: Diagnosis not present

## 2021-06-28 DIAGNOSIS — Z6839 Body mass index (BMI) 39.0-39.9, adult: Secondary | ICD-10-CM

## 2021-06-28 MED ORDER — POLYSACCHARIDE IRON COMPLEX 150 MG PO CAPS: 150.0000 mg | ORAL_CAPSULE | Freq: Every day | ORAL | 0 refills | Status: DC

## 2021-06-28 MED ORDER — SEMAGLUTIDE (1 MG/DOSE) 4 MG/3ML ~~LOC~~ SOPN: 1.0000 mg | PEN_INJECTOR | SUBCUTANEOUS | 0 refills | Status: DC

## 2021-06-29 LAB — COMPREHENSIVE METABOLIC PANEL
ALT: 11 IU/L (ref 0–32)
AST: 13 IU/L (ref 0–40)
Albumin/Globulin Ratio: 1.2 (ref 1.2–2.2)
Albumin: 4.1 g/dL (ref 3.8–4.9)
Alkaline Phosphatase: 80 IU/L (ref 44–121)
BUN/Creatinine Ratio: 11 (ref 9–23)
BUN: 9 mg/dL (ref 6–24)
Bilirubin Total: 0.2 mg/dL (ref 0.0–1.2)
CO2: 22 mmol/L (ref 20–29)
Calcium: 8.8 mg/dL (ref 8.7–10.2)
Chloride: 103 mmol/L (ref 96–106)
Creatinine, Ser: 0.83 mg/dL (ref 0.57–1.00)
Globulin, Total: 3.3 g/dL (ref 1.5–4.5)
Glucose: 92 mg/dL (ref 70–99)
Potassium: 3.8 mmol/L (ref 3.5–5.2)
Sodium: 141 mmol/L (ref 134–144)
Total Protein: 7.4 g/dL (ref 6.0–8.5)
eGFR: 85 mL/min/{1.73_m2} (ref >59)

## 2021-06-29 LAB — ANEMIA PANEL
Ferritin: 76 ng/mL (ref 15–150)
Folate, Hemolysate: 471 ng/mL
Folate, RBC: 1365 ng/mL (ref >498)
Hematocrit: 34.5 % (ref 34.0–46.6)
Iron Saturation: 17 % (ref 15–55)
Iron: 50 ug/dL (ref 27–159)
Retic Ct Pct: 1.4 % (ref 0.6–2.6)
Total Iron Binding Capacity: 299 ug/dL (ref 250–450)
UIBC: 249 ug/dL (ref 131–425)
Vitamin B-12: 812 pg/mL (ref 232–1245)

## 2021-06-29 LAB — LIPID PANEL WITH LDL/HDL RATIO
Cholesterol, Total: 198 mg/dL (ref 100–199)
HDL: 43 mg/dL (ref >39)
LDL Chol Calc (NIH): 137 mg/dL — ABNORMAL HIGH (ref 0–99)
LDL/HDL Ratio: 3.2 ratio (ref 0.0–3.2)
Triglycerides: 101 mg/dL (ref 0–149)
VLDL Cholesterol Cal: 18 mg/dL (ref 5–40)

## 2021-06-29 LAB — INSULIN, RANDOM: INSULIN: 11.1 u[IU]/mL (ref 2.6–24.9)

## 2021-06-29 LAB — HEMOGLOBIN A1C
Est. average glucose Bld gHb Est-mCnc: 105 mg/dL
Hgb A1c MFr Bld: 5.3 % (ref 4.8–5.6)

## 2021-06-29 NOTE — Progress Notes (Signed)
Chief Complaint:   OBESITY Kathleen Odonnell is here to discuss her progress with her obesity treatment plan along with follow-up of her obesity related diagnoses. Kathleen Odonnell is on the Category 2 Plan and states she is following her eating plan approximately 60% of the time. Kathleen Odonnell states she is at the gym for 60 minutes 3 times per week.  Today's visit was #: 8 Starting weight: 214 lbs Starting date: 03/04/2021 Today's weight: 193 lbs Today's date: 06/28/2021 Total lbs lost to date: 21 Total lbs lost since last in-office visit: 5  Interim History: Kathleen Odonnell is down an additional 5 pounds since her last visit.  She has been out of her routine.  Subjective:   1. Prediabetes Kathleen Odonnell is taking Ozempic as directed.  2. Other iron deficiency anemia Kathleen Odonnell is taking iron as directed.  3. Elevated cholesterol Kathleen Odonnell is not on medications currently.  Assessment/Plan:   1. Prediabetes We will check labs today.  [name] agreed to increase Ozempic to 1 mg once weekly, with no refills.  - Insulin, random - Hemoglobin A1c - Semaglutide, 1 MG/DOSE, 4 MG/3ML SOPN; Inject 1 mg as directed once a week.  Dispense: 3 mL; Refill: 0  2. Other iron deficiency anemia We will check labs today, and we will refill Nu-iron 150 mg daily for 1 month.  We will follow-up on lab results at Kathleen Odonnell's next visit.  - Anemia panel - Ferritin - iron polysaccharides (NU-IRON) 150 MG capsule; Take 1 capsule (150 mg total) by mouth daily.  Dispense: 30 capsule; Refill: 0  3. Elevated cholesterol We will check labs today, and we will follow-up at timing his next visit.  - Lipid Panel With LDL/HDL Ratio - Comprehensive metabolic panel  4. Obesity, current BMI 39.1 [name] is currently in the action stage of change. As such, her goal is to continue with weight loss efforts. She has agreed to the Category 2 Plan.   Meal planning and mindful eating were discussed.  Exercise goals: Start at 30 minutes of cardio and  resistance.  Behavioral modification strategies: increasing lean protein intake, decreasing simple carbohydrates, increasing vegetables, increasing water intake, decreasing liquid calories, decreasing eating out, no skipping meals, meal planning and cooking strategies, keeping healthy foods in the home, and planning for success.  Kathleen Odonnell has agreed to follow-up with our clinic in 3 weeks. She was informed of the importance of frequent follow-up visits to maximize her success with intensive lifestyle modifications for her multiple health conditions.   Kathleen Odonnell was informed we would discuss her lab results at her next visit unless there is a critical issue that needs to be addressed sooner. Kathleen Odonnell agreed to keep her next visit at the agreed upon time to discuss these results.  Objective:   Blood pressure 95/63, pulse 80, temperature 97.8 F (36.6 C), height 4\' 11"  (1.499 m), weight 193 lb (87.5 kg), SpO2 97 %. Body mass index is 38.98 kg/m.  General: Cooperative, alert, well developed, in no acute distress. HEENT: Conjunctivae and lids unremarkable. Cardiovascular: Regular rhythm.  Lungs: Normal work of breathing. Neurologic: No focal deficits.   Lab Results  Component Value Date   CREATININE 0.83 06/28/2021   BUN 9 06/28/2021   NA 141 06/28/2021   K 3.8 06/28/2021   CL 103 06/28/2021   CO2 22 06/28/2021   Lab Results  Component Value Date   ALT 11 06/28/2021   AST 13 06/28/2021   ALKPHOS 80 06/28/2021   BILITOT 0.2 06/28/2021   Lab Results  Component Value Date   HGBA1C 5.3 06/28/2021   HGBA1C 5.8 (H) 03/04/2021   Lab Results  Component Value Date   INSULIN 11.1 06/28/2021   INSULIN 11.4 03/04/2021   Lab Results  Component Value Date   TSH 1.860 03/04/2021   Lab Results  Component Value Date   CHOL 198 06/28/2021   HDL 43 06/28/2021   LDLCALC 137 (H) 06/28/2021   TRIG 101 06/28/2021   Lab Results  Component Value Date   VD25OH 43.6 03/04/2021   Lab Results   Component Value Date   WBC 12.6 (H) 06/28/2007   HGB 8.9 DELTA CHECK NOTED (L) 06/28/2007   HCT 34.5 06/28/2021   MCV 85.3 06/28/2007   PLT 201 06/28/2007   Lab Results  Component Value Date   IRON 50 06/28/2021   TIBC 299 06/28/2021   FERRITIN 76 06/28/2021   Attestation Statements:   Reviewed by clinician on day of visit: allergies, medications, problem list, medical history, surgical history, family history, social history, and previous encounter notes.  Trude Mcburney, am acting as Energy manager for [employer], DO.  I have reviewed the above documentation for accuracy and completeness, and I agree with the above. Corinna Capra, DO

## 2021-07-20 ENCOUNTER — Ambulatory Visit (INDEPENDENT_AMBULATORY_CARE_PROVIDER_SITE_OTHER): Payer: BC Managed Care – PPO | Admitting: Nurse Practitioner

## 2021-07-20 ENCOUNTER — Encounter (INDEPENDENT_AMBULATORY_CARE_PROVIDER_SITE_OTHER): Payer: Self-pay | Admitting: Nurse Practitioner

## 2021-07-20 VITALS — BP 108/71 | HR 70 | Temp 98.1°F | Ht 59.0 in | Wt 200.0 lb

## 2021-07-20 DIAGNOSIS — R7303 Prediabetes: Secondary | ICD-10-CM

## 2021-07-20 DIAGNOSIS — E669 Obesity, unspecified: Secondary | ICD-10-CM | POA: Diagnosis not present

## 2021-07-20 DIAGNOSIS — Z6841 Body Mass Index (BMI) 40.0 and over, adult: Secondary | ICD-10-CM

## 2021-07-20 DIAGNOSIS — E7849 Other hyperlipidemia: Secondary | ICD-10-CM

## 2021-07-20 DIAGNOSIS — D508 Other iron deficiency anemias: Secondary | ICD-10-CM

## 2021-07-20 DIAGNOSIS — Z862 Personal history of diseases of the blood and blood-forming organs and certain disorders involving the immune mechanism: Secondary | ICD-10-CM

## 2021-07-20 DIAGNOSIS — E7800 Pure hypercholesterolemia, unspecified: Secondary | ICD-10-CM

## 2021-07-20 MED ORDER — SEMAGLUTIDE (1 MG/DOSE) 4 MG/3ML ~~LOC~~ SOPN
1.0000 mg | PEN_INJECTOR | SUBCUTANEOUS | 0 refills | Status: DC
Start: 2021-07-20 — End: 2021-08-18

## 2021-07-20 NOTE — Patient Instructions (Signed)
The 10-year ASCVD risk score (Arnett DK, et al., 2019) is: 1.7%   Values used to calculate the score:     Age: 51 years     Sex: Female     Is Non-Hispanic African American: Yes     Diabetic: No     Tobacco smoker: No     Systolic Blood Pressure: 108 mmHg     Is BP treated: No     HDL Cholesterol: 43 mg/dL     Total Cholesterol: 198 mg/dL

## 2021-07-21 NOTE — Progress Notes (Signed)
Chief Complaint:   OBESITY Kathleen Odonnell is here to discuss her progress with her obesity treatment plan along with follow-up of her obesity related diagnoses. Kathleen Odonnell is on the Category 2 Plan and states she is following her eating plan approximately 50% of the time. Kathleen Odonnell states she is using treadmill/strength training 60 minutes 3 times per week.  Today's visit was #: 9 Starting weight: 214 lbs Starting date: 03/04/2021 Today's weight: 200 lbs Today's date: 07/20/2021 Total lbs lost to date: 14 lbs Total lbs lost since last in-office visit: 0  Interim History: Kathleen Odonnell recently returned from vacation. She started going to the gym 1 month ago. Felt she got off track and plans to get back on track. Notes hunger and cravings. Felt Wegovy worked better for her. She felt more appetite suppressant while taking it. Drinking water, coffee, tea (not on a daily basis). Reports summer is "bad for me due to eating out a lot". Does not get in enough protein.  Subjective:   1. Prediabetes Labs discussed during visit today. [name] is currently taking Ozempic 1 mg. Denies any side effects. Reports polyphagia and cravings.  2. History of anemia Labs discussed during visit today. Kathleen Odonnell never had a colonoscopy or EGD. She did cologard in Oct 2022.  3. Other hyperlipidemia Labs discussed during visit today. Kathleen Odonnell has never been on medications. 10 year ASCVD risk score 1.7%. Reviewed with patient.  Assessment/Plan:   1. Prediabetes We will refill Ozempic 1 mg SubQ once weekly for 1 month with 0 refills.  Kathleen Odonnell will continue to work on weight loss, exercise, and decreasing simple carbohydrates to help decrease the risk of diabetes.    -Refill Semaglutide, 1 MG/DOSE, 4 MG/3ML SOPN; Inject 1 mg as directed once a week.  Dispense: 3 mL; Refill: 0  2. History of anemia [name] will continue to follow up with PCP.  3. Other hyperlipidemia Cardiovascular risk and specific lipid/LDL goals reviewed.  We discussed  several lifestyle modifications today and Kathleen Odonnell will continue to work on diet, exercise and weight loss efforts. Orders and follow up as documented in patient record.   Counseling Intensive lifestyle modifications are the first line treatment for this issue. Dietary changes: Increase soluble fiber. Decrease simple carbohydrates. Exercise changes: Moderate to vigorous-intensity aerobic activity 150 minutes per week if tolerated. Lipid-lowering medications: see documented in medical record.   The 10-year ASCVD risk score (Arnett DK, et al., 2019) is: 1.7%   Values used to calculate the score:     Age: 52 years     Sex: Female     Is Non-Hispanic African American: Yes     Diabetic: No     Tobacco smoker: No     Systolic Blood Pressure: 108 mmHg     Is BP treated: No     HDL Cholesterol: 43 mg/dL     Total Cholesterol: 198 mg/dL   4. Obesity, current BMI 40.4 Kathleen Odonnell is currently in the action stage of change. As such, her goal is to continue with weight loss efforts. She has agreed to the Category 2 Plan.   Kathleen Odonnell will track using MyFitness Pal and review at next office visit.  Exercise goals: As is.  Behavioral modification strategies: increasing lean protein intake, increasing water intake, planning for success, and keeping a strict food journal.  Kathleen Odonnell has agreed to follow-up with our clinic in 3 weeks. She was informed of the importance of frequent follow-up visits to maximize her success with intensive lifestyle modifications for  her multiple health conditions.   Objective:   Blood pressure 108/71, pulse 70, temperature 98.1 F (36.7 C), height 4\' 11"  (1.499 m), weight 200 lb (90.7 kg), SpO2 97 %. Body mass index is 40.4 kg/m.  General: Cooperative, alert, well developed, in no acute distress. HEENT: Conjunctivae and lids unremarkable. Cardiovascular: Regular rhythm.  Lungs: Normal work of breathing. Neurologic: No focal deficits.   Lab Results  Component Value Date    CREATININE 0.83 06/28/2021   BUN 9 06/28/2021   NA 141 06/28/2021   K 3.8 06/28/2021   CL 103 06/28/2021   CO2 22 06/28/2021   Lab Results  Component Value Date   ALT 11 06/28/2021   AST 13 06/28/2021   ALKPHOS 80 06/28/2021   BILITOT 0.2 06/28/2021   Lab Results  Component Value Date   HGBA1C 5.3 06/28/2021   HGBA1C 5.8 (H) 03/04/2021   Lab Results  Component Value Date   INSULIN 11.1 06/28/2021   INSULIN 11.4 03/04/2021   Lab Results  Component Value Date   TSH 1.860 03/04/2021   Lab Results  Component Value Date   CHOL 198 06/28/2021   HDL 43 06/28/2021   LDLCALC 137 (H) 06/28/2021   TRIG 101 06/28/2021   Lab Results  Component Value Date   VD25OH 43.6 03/04/2021   Lab Results  Component Value Date   WBC 12.6 (H) 06/28/2007   HGB 8.9 DELTA CHECK NOTED (L) 06/28/2007   HCT 34.5 06/28/2021   MCV 85.3 06/28/2007   PLT 201 06/28/2007   Lab Results  Component Value Date   IRON 50 06/28/2021   TIBC 299 06/28/2021   FERRITIN 76 06/28/2021   Attestation Statements:   Reviewed by clinician on day of visit: allergies, medications, problem list, medical history, surgical history, family history, social history, and previous encounter notes.  I, Brendell Tyus, RMA, am acting as transcriptionist for 06/30/2021, FNP.  I have reviewed the above documentation for accuracy and completeness, and I agree with the above. Irene Limbo, FNP

## 2021-07-27 ENCOUNTER — Telehealth (HOSPITAL_BASED_OUTPATIENT_CLINIC_OR_DEPARTMENT_OTHER): Payer: BC Managed Care – PPO | Admitting: Psychiatry

## 2021-07-27 ENCOUNTER — Encounter (HOSPITAL_COMMUNITY): Payer: Self-pay | Admitting: Psychiatry

## 2021-07-27 DIAGNOSIS — F331 Major depressive disorder, recurrent, moderate: Secondary | ICD-10-CM

## 2021-07-27 DIAGNOSIS — F4310 Post-traumatic stress disorder, unspecified: Secondary | ICD-10-CM | POA: Diagnosis not present

## 2021-07-27 MED ORDER — TRAZODONE HCL 100 MG PO TABS: 100.0000 mg | ORAL_TABLET | Freq: Every day | ORAL | 0 refills | Status: DC

## 2021-07-27 MED ORDER — ARIPIPRAZOLE 5 MG PO TABS: ORAL_TABLET | ORAL | 0 refills | Status: DC

## 2021-07-27 MED ORDER — CITALOPRAM HYDROBROMIDE 40 MG PO TABS: 40.0000 mg | ORAL_TABLET | Freq: Every day | ORAL | 0 refills | Status: DC
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
## 2021-08-11 ENCOUNTER — Encounter (INDEPENDENT_AMBULATORY_CARE_PROVIDER_SITE_OTHER): Payer: Self-pay

## 2021-08-11 ENCOUNTER — Encounter (INDEPENDENT_AMBULATORY_CARE_PROVIDER_SITE_OTHER): Payer: Self-pay | Admitting: Nurse Practitioner

## 2021-08-18 ENCOUNTER — Ambulatory Visit (INDEPENDENT_AMBULATORY_CARE_PROVIDER_SITE_OTHER): Payer: BC Managed Care – PPO | Admitting: Nurse Practitioner

## 2021-08-18 ENCOUNTER — Encounter (INDEPENDENT_AMBULATORY_CARE_PROVIDER_SITE_OTHER): Payer: Self-pay | Admitting: Nurse Practitioner

## 2021-08-18 VITALS — BP 93/58 | HR 84 | Temp 97.9°F | Ht 59.0 in | Wt 187.0 lb

## 2021-08-18 DIAGNOSIS — D508 Other iron deficiency anemias: Secondary | ICD-10-CM

## 2021-08-18 DIAGNOSIS — R7303 Prediabetes: Secondary | ICD-10-CM

## 2021-08-18 DIAGNOSIS — Z6837 Body mass index (BMI) 37.0-37.9, adult: Secondary | ICD-10-CM

## 2021-08-18 DIAGNOSIS — E669 Obesity, unspecified: Secondary | ICD-10-CM | POA: Diagnosis not present

## 2021-08-18 DIAGNOSIS — E7849 Other hyperlipidemia: Secondary | ICD-10-CM

## 2021-08-18 MED ORDER — POLYSACCHARIDE IRON COMPLEX 150 MG PO CAPS: 150.0000 mg | ORAL_CAPSULE | Freq: Every day | ORAL | 0 refills | Status: DC

## 2021-08-18 MED ORDER — SEMAGLUTIDE (1 MG/DOSE) 4 MG/3ML ~~LOC~~ SOPN
1.0000 mg | PEN_INJECTOR | SUBCUTANEOUS | 0 refills | Status: DC
Start: 2021-08-18 — End: 2021-08-26

## 2021-08-18 NOTE — Patient Instructions (Signed)
The 10-year ASCVD risk score (Arnett DK, et al., 2019) is: 1%   Values used to calculate the score:     Age: 51 years     Sex: Female     Is Non-Hispanic African American: Yes     Diabetic: No     Tobacco smoker: No     Systolic Blood Pressure: 93 mmHg     Is BP treated: No     HDL Cholesterol: 43 mg/dL     Total Cholesterol: 198 mg/dL

## 2021-08-19 ENCOUNTER — Telehealth (INDEPENDENT_AMBULATORY_CARE_PROVIDER_SITE_OTHER): Payer: Self-pay | Admitting: Nurse Practitioner

## 2021-08-19 ENCOUNTER — Encounter (INDEPENDENT_AMBULATORY_CARE_PROVIDER_SITE_OTHER): Payer: Self-pay

## 2021-08-19 NOTE — Telephone Encounter (Signed)
Kathleen Odonnell - Prior authorization denied for Ozempic. Per insurance: Patient does not have type 2 diabetes. Patient sent denial message via mychart.

## 2021-08-25 NOTE — Progress Notes (Unsigned)
Chief Complaint:   OBESITY Kathleen Odonnell is here to discuss her progress with her obesity treatment plan along with follow-up of her obesity related diagnoses. Kathleen Odonnell is on the Category 2 Plan and states she is following her eating plan approximately 0% of the time. Kathleen Odonnell states she is cardio 120 minutes 3 times per week.  Today's visit was #: 10 Starting weight: 214 lbs Starting date: 03/04/2021 Today's weight: 187 lbs Today's date: 08/18/2021 Total lbs lost to date: 27 lbs Total lbs lost since last in-office visit: 3  Interim History: Kathleen Odonnell has done well with weight loss since her last visit. She is eating 2 meals and 2 snacks daily. Has been following PC/[place]. Snacks: fruit and Yesso bars. Going to the gym on a regular basis. When eating out she is now healthier choices. Drinking sweet tea, diet sodas and water. Her highest weight was 220 lbs.  Subjective:   1. Prediabetes Tannisha is currently taking Ozempic 1 mg. Denies any side effects.  2. Other iron deficiency anemia Ralphine is taking Iron daily. Denies any side effects. Last labs 06/28/21.  3. Other hyperlipidemia Family history: mother never been on medications.  Assessment/Plan:   1. Prediabetes We will refill Ozempic 1 mg SubQ once weekly for 1 month with 0 refills. Side effects discussed.   -Refill Semaglutide, 1 MG/DOSE, 4 MG/3ML SOPN; Inject 1 mg as directed once a week.  Dispense: 3 mL; Refill: 0  2. Other iron deficiency anemia We will refill Iron 150 mg daily for 1 month with 0 refills. Side effects discussed.   -Refill iron polysaccharides (NU-IRON) 150 MG capsule; Take 1 capsule (150 mg total) by mouth daily.  Dispense: 30 capsule; Refill: 0  3. Other hyperlipidemia ASCVD reviewed with patient today. Cardiovascular risk and specific lipid/LDL goals reviewed.  We discussed several lifestyle modifications today and Breta will continue to work on diet, exercise and weight loss efforts. Orders and follow up as documented  in patient record.   The 10-year ASCVD risk score (Arnett DK, et al., 2019) is: 1%   Values used to calculate the score:     Age: 51 years     Sex: Female     Is Non-Hispanic African American: Yes     Diabetic: No     Tobacco smoker: No     Systolic Blood Pressure: 93 mmHg     Is BP treated: No     HDL Cholesterol: 43 mg/dL     Total Cholesterol: 198 mg/dL  Counseling Intensive lifestyle modifications are the first line treatment for this issue. Dietary changes: Increase soluble fiber. Decrease simple carbohydrates. Exercise changes: Moderate to vigorous-intensity aerobic activity 150 minutes per week if tolerated. Lipid-lowering medications: see documented in medical record.  4. Obesity, current BMI 37.8 Kathleen Odonnell is currently in the action stage of change. As such, her goal is to continue with weight loss efforts. She has agreed to practicing portion control and making smarter food choices, such as increasing vegetables and decreasing simple carbohydrates.   Dining out guide given today.  Exercise goals: As is.  Behavioral modification strategies: increasing lean protein intake, increasing water intake, and meal planning and cooking strategies.  Kathleen Odonnell has agreed to follow-up with our clinic in 4 weeks. She was informed of the importance of frequent follow-up visits to maximize her success with intensive lifestyle modifications for her multiple health conditions.   Objective:   Blood pressure (!) 93/58, pulse 84, temperature 97.9 F (36.6 C), height 4\' 11"  (  1.499 m), weight 187 lb (84.8 kg), SpO2 97 %. Body mass index is 37.77 kg/m.  General: Cooperative, alert, well developed, in no acute distress. HEENT: Conjunctivae and lids unremarkable. Cardiovascular: Regular rhythm.  Lungs: Normal work of breathing. Neurologic: No focal deficits.   Lab Results  Component Value Date   CREATININE 0.83 06/28/2021   BUN 9 06/28/2021   NA 141 06/28/2021   K 3.8 06/28/2021   CL 103  06/28/2021   CO2 22 06/28/2021   Lab Results  Component Value Date   ALT 11 06/28/2021   AST 13 06/28/2021   ALKPHOS 80 06/28/2021   BILITOT 0.2 06/28/2021   Lab Results  Component Value Date   HGBA1C 5.3 06/28/2021   HGBA1C 5.8 (H) 03/04/2021   Lab Results  Component Value Date   INSULIN 11.1 06/28/2021   INSULIN 11.4 03/04/2021   Lab Results  Component Value Date   TSH 1.860 03/04/2021   Lab Results  Component Value Date   CHOL 198 06/28/2021   HDL 43 06/28/2021   LDLCALC 137 (H) 06/28/2021   TRIG 101 06/28/2021   Lab Results  Component Value Date   VD25OH 43.6 03/04/2021   Lab Results  Component Value Date   WBC 12.6 (H) 06/28/2007   HGB 8.9 DELTA CHECK NOTED (L) 06/28/2007   HCT 34.5 06/28/2021   MCV 85.3 06/28/2007   PLT 201 06/28/2007   Lab Results  Component Value Date   IRON 50 06/28/2021   TIBC 299 06/28/2021   FERRITIN 76 06/28/2021   Attestation Statements:   Reviewed by clinician on day of visit: allergies, medications, problem list, medical history, surgical history, family history, social history, and previous encounter notes.  I, Brendell Tyus, RMA, am acting as transcriptionist for Irene Limbo, FNP.  I have reviewed the above documentation for accuracy and completeness, and I agree with the above. Irene Limbo, FNP

## 2021-08-26 ENCOUNTER — Other Ambulatory Visit (INDEPENDENT_AMBULATORY_CARE_PROVIDER_SITE_OTHER): Payer: Self-pay | Admitting: Nurse Practitioner

## 2021-08-26 MED ORDER — WEGOVY 1.7 MG/0.75ML ~~LOC~~ SOAJ: 1.7000 mg | SUBCUTANEOUS | 0 refills | Status: DC

## 2021-09-16 ENCOUNTER — Encounter (INDEPENDENT_AMBULATORY_CARE_PROVIDER_SITE_OTHER): Payer: Self-pay | Admitting: Nurse Practitioner

## 2021-09-16 ENCOUNTER — Ambulatory Visit (INDEPENDENT_AMBULATORY_CARE_PROVIDER_SITE_OTHER): Payer: BC Managed Care – PPO | Admitting: Nurse Practitioner

## 2021-09-16 VITALS — BP 102/67 | HR 78 | Temp 98.5°F | Ht 59.0 in | Wt 186.0 lb

## 2021-09-16 DIAGNOSIS — Z7985 Long-term (current) use of injectable non-insulin antidiabetic drugs: Secondary | ICD-10-CM

## 2021-09-16 DIAGNOSIS — E669 Obesity, unspecified: Secondary | ICD-10-CM

## 2021-09-16 DIAGNOSIS — R7303 Prediabetes: Secondary | ICD-10-CM | POA: Diagnosis not present

## 2021-09-16 DIAGNOSIS — D508 Other iron deficiency anemias: Secondary | ICD-10-CM | POA: Diagnosis not present

## 2021-09-16 DIAGNOSIS — Z6837 Body mass index (BMI) 37.0-37.9, adult: Secondary | ICD-10-CM | POA: Diagnosis not present

## 2021-09-16 MED ORDER — POLYSACCHARIDE IRON COMPLEX 150 MG PO CAPS: 150.0000 mg | ORAL_CAPSULE | Freq: Every day | ORAL | 0 refills | Status: DC

## 2021-09-16 MED ORDER — WEGOVY 1.7 MG/0.75ML ~~LOC~~ SOAJ
1.7000 mg | SUBCUTANEOUS | 0 refills | Status: DC
Start: 2021-09-16 — End: 2021-10-18

## 2021-09-20 NOTE — Progress Notes (Signed)
Chief Complaint:   OBESITY Kathleen Odonnell is here to discuss her progress with her obesity treatment plan along with follow-up of her obesity related diagnoses. Kathleen Odonnell is on practicing portion control and making smarter food choices, such as increasing vegetables and decreasing simple carbohydrates and states she is following her eating plan approximately 100% of the time. Kathleen Odonnell states she is exercising 0 minutes 0 times per week.  Today's visit was #: 11 Starting weight: 214 lbs Starting date: 03/04/2021 Today's weight: 186 lbs Today's date: 09/16/2021 Total lbs lost to date: 28 lbs Total lbs lost since last in-office visit: 1  Interim History: Kathleen Odonnell has overall done well with weight loss. Has been following PC/[place]. Since starting back to school has not been able to go to the gym. Drinks sweet tea, diet sodas and water, (not enough). She is taking Wegovy 1.7 mg. Notes some nausea after injection the first day. Aiming to eat more protein.  Subjective:   1. Prediabetes Kathleen Odonnell stopped Ozempic 3 weeks ago due to cost. Now taking Wegovy 1.7 mg for the past 2 weeks. Notes some polyphagia and cravings and feels due to not eating enough protein.  2. Other iron deficiency anemia Kathleen Odonnell is taking iron daily. Denies any side effects.  Assessment/Plan:   1. Prediabetes Continue Wegovy 1.7 mg. Denies any side effects.  Kathleen Odonnell will continue to work on weight loss, exercise, and decreasing simple carbohydrates to help decrease the risk of diabetes.    2. Other iron deficiency anemia We will refill Iron 150 mg daily for 1 month with 0 refills.  -Refill iron polysaccharides (NU-IRON) 150 MG capsule; Take 1 capsule (150 mg total) by mouth daily.  Dispense: 30 capsule; Refill: 0  3. Obesity, current BMI 37.6 We will refill Wegovy 1.7 mg once a week for 64month with 0 refills  -Refill Semaglutide-Weight Management (WEGOVY) 1.7 MG/0.75ML SOAJ; Inject 1.7 mg into the skin once a week.  Dispense: 3 mL;  Refill: 0  Kathleen Odonnell is currently in the action stage of change. As such, her goal is to continue with weight loss efforts. She has agreed to practicing portion control and making smarter food choices, such as increasing vegetables and decreasing simple carbohydrates.   Exercise goals: All adults should avoid inactivity. Some physical activity is better than none, and adults who participate in any amount of physical activity gain some health benefits.  Behavioral modification strategies: increasing lean protein intake, increasing vegetables, and increasing water intake.  Kathleen Odonnell has agreed to follow-up with our clinic in 4 weeks. She was informed of the importance of frequent follow-up visits to maximize her success with intensive lifestyle modifications for her multiple health conditions.   Objective:   Blood pressure 102/67, pulse 78, temperature 98.5 F (36.9 C), height 4\' 11"  (1.499 m), weight 186 lb (84.4 kg), SpO2 95 %. Body mass index is 37.57 kg/m.  General: Cooperative, alert, well developed, in no acute distress. HEENT: Conjunctivae and lids unremarkable. Cardiovascular: Regular rhythm.  Lungs: Normal work of breathing. Neurologic: No focal deficits.   Lab Results  Component Value Date   CREATININE 0.83 06/28/2021   BUN 9 06/28/2021   NA 141 06/28/2021   K 3.8 06/28/2021   CL 103 06/28/2021   CO2 22 06/28/2021   Lab Results  Component Value Date   ALT 11 06/28/2021   AST 13 06/28/2021   ALKPHOS 80 06/28/2021   BILITOT 0.2 06/28/2021   Lab Results  Component Value Date   HGBA1C 5.3 06/28/2021  HGBA1C 5.8 (H) 03/04/2021   Lab Results  Component Value Date   INSULIN 11.1 06/28/2021   INSULIN 11.4 03/04/2021   Lab Results  Component Value Date   TSH 1.860 03/04/2021   Lab Results  Component Value Date   CHOL 198 06/28/2021   HDL 43 06/28/2021   LDLCALC 137 (H) 06/28/2021   TRIG 101 06/28/2021   Lab Results  Component Value Date   VD25OH 43.6 03/04/2021    Lab Results  Component Value Date   WBC 12.6 (H) 06/28/2007   HGB 8.9 DELTA CHECK NOTED (L) 06/28/2007   HCT 34.5 06/28/2021   MCV 85.3 06/28/2007   PLT 201 06/28/2007   Lab Results  Component Value Date   IRON 50 06/28/2021   TIBC 299 06/28/2021   FERRITIN 76 06/28/2021   Attestation Statements:   Reviewed by clinician on day of visit: allergies, medications, problem list, medical history, surgical history, family history, social history, and previous encounter notes.  I, Brendell Tyus, RMA, am acting as transcriptionist for Irene Limbo, FNP.  I have reviewed the above documentation for accuracy and completeness, and I agree with the above. Irene Limbo, FNP

## 2021-10-14 NOTE — Progress Notes (Signed)
TeleHealth Visit:  This visit was completed with telemedicine (audio/video) technology. Kathleen Odonnell has verbally consented to this TeleHealth visit. The patient is located at home, the provider is located at home. The participants in this visit include the listed provider and patient. The visit was conducted today via MyChart video.  OBESITY Kathleen Odonnell is here to discuss her progress with her obesity treatment plan along with follow-up of her obesity related diagnoses.   Today's visit was # 12 Starting weight: 214 lbs Starting date: 03/04/2021 Weight at last in office visit: 186 lbs on 09/16/21 Total weight loss: 28 lbs at last in office visit on 09/16/21. Today's reported weight: 180 lbs   Nutrition Plan: practicing portion control and making smarter food choices, such as increasing vegetables and decreasing simple carbohydrates.   Current exercise: none  Interim History: Kathleen Odonnell says that she had her first goal of 180 pounds today.   Final weight goal is 160 pounds-32 BMI.  She struggles to get in enough protein but she is working on it.   Denies skipping meals.  She packs food to take to work.  She is a Associate Professor at [employer].  Typical day of food: Breakfast-quicker protein oatmeal made with water Lunch-chicken and fruit Dinner-fish or chicken and a vegetable.  She generally drinks coffee or water but does have sweet tea a few times per week.  She was sick with the flu over the past week and has had low appetite. She was walking 1 mile 5 days/week (about 20 to 25 minutes) until she became ill.  Assessment/Plan:  1.  Iron deficiency anemia Last iron level was normal at 50 and ferritin was normal at 76. No hemoglobin value available.  She was referred to hematology back in March but I do not see that she had a visit. She is on Nu-Iron 150 mg daily. Lab Results  Component Value Date   WBC 12.6 (H) 06/28/2007   HGB 8.9 DELTA CHECK NOTED (L) 06/28/2007   HCT  34.5 06/28/2021   MCV 85.3 06/28/2007   PLT 201 06/28/2007   Lab Results  Component Value Date   IRON 50 06/28/2021   TIBC 299 06/28/2021   FERRITIN 76 06/28/2021     Plan: Refill Nu-Iron 150 mg daily. Check labs next office visit.  2. Polyphagia Appetite well controlled with Wegovy 1.7 mg weekly.  Denies side effects.  Plan: Refill Wegovy 1.7 mg weekly.   3. Prediabetes A1c was 5.8 on 03/04/2021 when she started our program.  It was 5.3 on 06/28/2021. Medication(s): Wegovy 1.7 mg weekly. Lab Results  Component Value Date   HGBA1C 5.3 06/28/2021   Lab Results  Component Value Date   INSULIN 11.1 06/28/2021   INSULIN 11.4 03/04/2021    Plan: Continue Wegovy 1.7 mg weekly.   4. Obesity: Current BMI 37.6 Kathleen Odonnell is currently in the action stage of change. As such, her goal is to continue with weight loss efforts.  She has agreed to practicing portion control and making smarter food choices, such as increasing vegetables and decreasing simple carbohydrates.   Protein equivalent handout sent via MyChart. Discussed she should have 4 ounces of protein at lunch and 6-8 ounces of protein at dinner. Make protein oatmeal with milk.  Exercise goals: Resume walking 1 mile 5 days/week.  Behavioral modification strategies: increasing lean protein intake, decreasing liquid calories, meal planning and cooking strategies, and planning for success.  Kathleen Odonnell has agreed to follow-up with our clinic in 3 weeks.  No orders of the defined types were placed in this encounter.   Medications Discontinued During This Encounter  Medication Reason   Semaglutide-Weight Management (WEGOVY) 1.7 MG/0.75ML SOAJ Reorder   iron polysaccharides (NU-IRON) 150 MG capsule Reorder     Meds ordered this encounter  Medications   Semaglutide-Weight Management (WEGOVY) 1.7 MG/0.75ML SOAJ    Sig: Inject 1.7 mg into the skin once a week.    Dispense:  3 mL    Refill:  0    Order Specific Question:    Supervising Provider    Answer:   Dell Ponto [[id number]]   iron polysaccharides (NU-IRON) 150 MG capsule    Sig: Take 1 capsule (150 mg total) by mouth daily.    Dispense:  30 capsule    Refill:  0    Order Specific Question:   Supervising Provider    Answer:   Dell Ponto [[id number]]      Objective:   VITALS: Per patient if applicable, see vitals. GENERAL: Alert and in no acute distress. CARDIOPULMONARY: No increased WOB. Speaking in clear sentences.  PSYCH: Pleasant and cooperative. Speech normal rate and rhythm. Affect is appropriate. Insight and judgement are appropriate. Attention is focused, linear, and appropriate.  NEURO: Oriented as arrived to appointment on time with no prompting.   Lab Results  Component Value Date   CREATININE 0.83 06/28/2021   BUN 9 06/28/2021   NA 141 06/28/2021   K 3.8 06/28/2021   CL 103 06/28/2021   CO2 22 06/28/2021   Lab Results  Component Value Date   ALT 11 06/28/2021   AST 13 06/28/2021   ALKPHOS 80 06/28/2021   BILITOT 0.2 06/28/2021   Lab Results  Component Value Date   HGBA1C 5.3 06/28/2021   HGBA1C 5.8 (H) 03/04/2021   Lab Results  Component Value Date   INSULIN 11.1 06/28/2021   INSULIN 11.4 03/04/2021   Lab Results  Component Value Date   TSH 1.860 03/04/2021   Lab Results  Component Value Date   CHOL 198 06/28/2021   HDL 43 06/28/2021   LDLCALC 137 (H) 06/28/2021   TRIG 101 06/28/2021   Lab Results  Component Value Date   WBC 12.6 (H) 06/28/2007   HGB 8.9 DELTA CHECK NOTED (L) 06/28/2007   HCT 34.5 06/28/2021   MCV 85.3 06/28/2007   PLT 201 06/28/2007   Lab Results  Component Value Date   IRON 50 06/28/2021   TIBC 299 06/28/2021   FERRITIN 76 06/28/2021   Lab Results  Component Value Date   VD25OH 43.6 03/04/2021    Attestation Statements:   Reviewed by clinician on day of visit: allergies, medications, problem list, medical history, surgical history, family history, social history, and previous  encounter notes.

## 2021-10-18 ENCOUNTER — Encounter (INDEPENDENT_AMBULATORY_CARE_PROVIDER_SITE_OTHER): Payer: Self-pay | Admitting: Family Medicine

## 2021-10-18 ENCOUNTER — Telehealth (INDEPENDENT_AMBULATORY_CARE_PROVIDER_SITE_OTHER): Payer: BC Managed Care – PPO | Admitting: Family Medicine

## 2021-10-18 DIAGNOSIS — D508 Other iron deficiency anemias: Secondary | ICD-10-CM

## 2021-10-18 DIAGNOSIS — R632 Polyphagia: Secondary | ICD-10-CM | POA: Diagnosis not present

## 2021-10-18 DIAGNOSIS — E669 Obesity, unspecified: Secondary | ICD-10-CM

## 2021-10-18 DIAGNOSIS — R7303 Prediabetes: Secondary | ICD-10-CM

## 2021-10-18 DIAGNOSIS — Z6837 Body mass index (BMI) 37.0-37.9, adult: Secondary | ICD-10-CM

## 2021-10-18 MED ORDER — WEGOVY 1.7 MG/0.75ML ~~LOC~~ SOAJ: 1.7000 mg | SUBCUTANEOUS | 0 refills | Status: DC

## 2021-10-18 MED ORDER — POLYSACCHARIDE IRON COMPLEX 150 MG PO CAPS: 150.0000 mg | ORAL_CAPSULE | Freq: Every day | ORAL | 0 refills | Status: DC

## 2021-10-26 ENCOUNTER — Encounter (HOSPITAL_COMMUNITY): Payer: Self-pay | Admitting: Psychiatry

## 2021-10-26 ENCOUNTER — Telehealth (HOSPITAL_BASED_OUTPATIENT_CLINIC_OR_DEPARTMENT_OTHER): Payer: BC Managed Care – PPO | Admitting: Psychiatry

## 2021-10-26 DIAGNOSIS — F331 Major depressive disorder, recurrent, moderate: Secondary | ICD-10-CM

## 2021-10-26 DIAGNOSIS — F4310 Post-traumatic stress disorder, unspecified: Secondary | ICD-10-CM | POA: Diagnosis not present

## 2021-10-26 MED ORDER — ARIPIPRAZOLE 2 MG PO TABS: 2.0000 mg | ORAL_TABLET | Freq: Every day | ORAL | 0 refills | Status: DC

## 2021-10-26 MED ORDER — TRAZODONE HCL 100 MG PO TABS: 100.0000 mg | ORAL_TABLET | Freq: Every day | ORAL | 0 refills | Status: DC

## 2021-10-26 MED ORDER — CITALOPRAM HYDROBROMIDE 40 MG PO TABS: 40.0000 mg | ORAL_TABLET | Freq: Every day | ORAL | 0 refills | Status: DC
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
## 2021-11-08 ENCOUNTER — Ambulatory Visit (INDEPENDENT_AMBULATORY_CARE_PROVIDER_SITE_OTHER): Payer: BC Managed Care – PPO | Admitting: Internal Medicine

## 2021-11-08 ENCOUNTER — Encounter (INDEPENDENT_AMBULATORY_CARE_PROVIDER_SITE_OTHER): Payer: Self-pay | Admitting: Internal Medicine

## 2021-11-08 VITALS — BP 124/80 | HR 81 | Temp 97.9°F | Ht 59.0 in | Wt 176.0 lb

## 2021-11-08 DIAGNOSIS — D508 Other iron deficiency anemias: Secondary | ICD-10-CM | POA: Diagnosis not present

## 2021-11-08 DIAGNOSIS — R7303 Prediabetes: Secondary | ICD-10-CM | POA: Diagnosis not present

## 2021-11-08 DIAGNOSIS — E669 Obesity, unspecified: Secondary | ICD-10-CM

## 2021-11-08 DIAGNOSIS — E7849 Other hyperlipidemia: Secondary | ICD-10-CM | POA: Diagnosis not present

## 2021-11-08 DIAGNOSIS — Z6837 Body mass index (BMI) 37.0-37.9, adult: Secondary | ICD-10-CM

## 2021-11-08 MED ORDER — WEGOVY 1.7 MG/0.75ML ~~LOC~~ SOAJ: 1.7000 mg | SUBCUTANEOUS | 0 refills | Status: DC

## 2021-11-08 MED ORDER — POLYSACCHARIDE IRON COMPLEX 150 MG PO CAPS: 150.0000 mg | ORAL_CAPSULE | Freq: Every day | ORAL | 0 refills | Status: DC

## 2021-11-09 LAB — CBC WITH DIFFERENTIAL
Basophils Absolute: 0 10*3/uL (ref 0.0–0.2)
Basos: 1 %
EOS (ABSOLUTE): 0.7 10*3/uL — ABNORMAL HIGH (ref 0.0–0.4)
Eos: 9 %
Hematocrit: 36.3 % (ref 34.0–46.6)
Hemoglobin: 12.1 g/dL (ref 11.1–15.9)
Immature Grans (Abs): 0 10*3/uL (ref 0.0–0.1)
Immature Granulocytes: 0 %
Lymphocytes Absolute: 2.3 10*3/uL (ref 0.7–3.1)
Lymphs: 27 %
MCH: 27.5 pg (ref 26.6–33.0)
MCHC: 33.3 g/dL (ref 31.5–35.7)
MCV: 83 fL (ref 79–97)
Monocytes Absolute: 0.5 10*3/uL (ref 0.1–0.9)
Monocytes: 6 %
Neutrophils Absolute: 5 10*3/uL (ref 1.4–7.0)
Neutrophils: 57 %
RBC: 4.4 x10E6/uL (ref 3.77–5.28)
RDW: 15.6 % — ABNORMAL HIGH (ref 11.7–15.4)
WBC: 8.5 10*3/uL (ref 3.4–10.8)

## 2021-11-09 LAB — LIPID PANEL WITH LDL/HDL RATIO
Cholesterol, Total: 219 mg/dL — ABNORMAL HIGH (ref 100–199)
HDL: 49 mg/dL (ref >39)
LDL Chol Calc (NIH): 150 mg/dL — ABNORMAL HIGH (ref 0–99)
LDL/HDL Ratio: 3.1 ratio (ref 0.0–3.2)
Triglycerides: 109 mg/dL (ref 0–149)
VLDL Cholesterol Cal: 20 mg/dL (ref 5–40)

## 2021-11-09 LAB — IRON,TIBC AND FERRITIN PANEL
Ferritin: 78 ng/mL (ref 15–150)
Iron Saturation: 20 % (ref 15–55)
Iron: 59 ug/dL (ref 27–159)
Total Iron Binding Capacity: 294 ug/dL (ref 250–450)
UIBC: 235 ug/dL (ref 131–425)

## 2021-11-18 ENCOUNTER — Other Ambulatory Visit (INDEPENDENT_AMBULATORY_CARE_PROVIDER_SITE_OTHER): Payer: Self-pay | Admitting: Internal Medicine

## 2021-11-18 ENCOUNTER — Telehealth (INDEPENDENT_AMBULATORY_CARE_PROVIDER_SITE_OTHER): Payer: Self-pay | Admitting: Internal Medicine

## 2021-11-18 NOTE — Telephone Encounter (Signed)
Wegovy is on back order. Patient is needing to know what to do, when she runs out. She only has one left. Please call or message patient on mychart

## 2021-11-22 ENCOUNTER — Encounter (INDEPENDENT_AMBULATORY_CARE_PROVIDER_SITE_OTHER): Payer: Self-pay

## 2021-11-22 NOTE — Progress Notes (Signed)
Chief Complaint:   OBESITY Kathleen Odonnell is here to discuss her progress with her obesity treatment plan along with follow-up of her obesity related diagnoses. Kathleen Odonnell is on practicing portion control and making smarter food choices, such as increasing vegetables and decreasing simple carbohydrates and states she is following her eating plan approximately 70% of the time. Kathleen Odonnell states she is walking 20 minutes 4 times per week.  Today's visit was #: 13 Starting weight: 214 lbs Starting date: 03/04/2021 Today's weight: 178 lbs Today's date: 11/08/2021 Total lbs lost to date: 36 Total lbs lost since last in-office visit: 8  Interim History: Kathleen Odonnell is doing well. She reports adequate satiety and satiation. She is not drinking enough water. Pt denies abnormal cravings or problems with meal plan. Kathleen Odonnell is on Wegovy without side effects. Bioimpedance scale change shows lowered body fat and visceral fat. She had some muscle loss but nothing significant. Her visceral fat rating went down from 16 to 12.  Subjective:   1. Other iron deficiency anemia Colonoscopy within normal limits, per pt. She is doing annual fecal occult blood test. No signs of bleeding.  2. Other hyperlipidemia Kathleen Odonnell's last LDL was 137. She is not on statin therapy.  3. Prediabetes Improvement with weight loss therapy.  Assessment/Plan:   1. Other iron deficiency anemia Obtain fasting labs today.  - CBC With Differential - Iron, TIBC and Ferritin Panel  Refill- iron polysaccharides (NU-IRON) 150 MG capsule; Take 1 capsule (150 mg total) by mouth daily.  Dispense: 30 capsule; Refill: 0  2. Other hyperlipidemia Continue weight loss therapy. Obtain fasting labs today. ASCVD risk score 2.9*% today   The 10-year ASCVD risk score (Arnett DK, et al., 2019) is: 2.9%   Values used to calculate the score:     Age: 51 years     Sex: Female     Is Non-Hispanic African American: Yes     Diabetic: No     Tobacco smoker: No      Systolic Blood Pressure: 124 mmHg     Is BP treated: No     HDL Cholesterol: 49 mg/dL     Total Cholesterol: 219 mg/dL  - Lipid Panel With LDL/HDL Ratio  3. Prediabetes Continue HDQQIW. No side effects.  Continue & Refill- Semaglutide-Weight Management (WEGOVY) 1.7 MG/0.75ML SOAJ; Inject 1.7 mg into the skin once a week.  Dispense: 3 mL; Refill: 0  4. Obesity, current BMI 37.1 Kathleen Odonnell is currently in the action stage of change. As such, her goal is to continue with weight loss efforts. She has agreed to practicing portion control and making smarter food choices, such as increasing vegetables and decreasing simple carbohydrates.   Continue & Refill- Semaglutide-Weight Management (WEGOVY) 1.7 MG/0.75ML SOAJ; Inject 1.7 mg into the skin once a week.  Dispense: 3 mL; Refill: 0  Exercise goals: We counseled patient on the role of strength training and weight loss therapy goal would be to do light weights or resistance 2-3 times a week main muscle groups.  Behavioral modification strategies: increasing lean protein intake, increasing water intake, meal planning and cooking strategies, and avoiding temptations.  Kathleen Odonnell has agreed to follow-up with our clinic in 2 weeks. She was informed of the importance of frequent follow-up visits to maximize her success with intensive lifestyle modifications for her multiple health conditions.   Kathleen Odonnell was informed we would discuss her lab results at her next visit unless there is a critical issue that needs to be addressed sooner. Kathleen Odonnell agreed  to keep her next visit at the agreed upon time to discuss these results.  Objective:   Blood pressure 124/80, pulse 81, temperature 97.9 F (36.6 C), height 4\' 11"  (1.499 m), weight 176 lb (79.8 kg), SpO2 98 %. Body mass index is 35.55 kg/m.  General: Cooperative, alert, well developed, in no acute distress. HEENT: Conjunctivae and lids unremarkable. Cardiovascular: Regular rhythm.  Lungs: Normal work of  breathing. Neurologic: No focal deficits.   Lab Results  Component Value Date   CREATININE 0.83 06/28/2021   BUN 9 06/28/2021   NA 141 06/28/2021   K 3.8 06/28/2021   CL 103 06/28/2021   CO2 22 06/28/2021   Lab Results  Component Value Date   ALT 11 06/28/2021   AST 13 06/28/2021   ALKPHOS 80 06/28/2021   BILITOT 0.2 06/28/2021   Lab Results  Component Value Date   HGBA1C 5.3 06/28/2021   HGBA1C 5.8 (H) 03/04/2021   Lab Results  Component Value Date   INSULIN 11.1 06/28/2021   INSULIN 11.4 03/04/2021   Lab Results  Component Value Date   TSH 1.860 03/04/2021   Lab Results  Component Value Date   CHOL 219 (H) 11/08/2021   HDL 49 11/08/2021   LDLCALC 150 (H) 11/08/2021   TRIG 109 11/08/2021   Lab Results  Component Value Date   VD25OH 43.6 03/04/2021   Lab Results  Component Value Date   WBC 8.5 11/08/2021   HGB 12.1 11/08/2021   HCT 36.3 11/08/2021   MCV 83 11/08/2021   PLT 201 06/28/2007   Lab Results  Component Value Date   IRON 59 11/08/2021   TIBC 294 11/08/2021   FERRITIN 78 11/08/2021    Attestation Statements:   Reviewed by clinician on day of visit: allergies, medications, problem list, medical history, surgical history, family history, social history, and previous encounter notes.  Time spent on visit including pre-visit chart review and post-visit care and charting was 20 minutes.   I, 13/06/2021, BS, CMA, am acting as transcriptionist for Kyung Rudd, MD.  I have reviewed the above documentation for accuracy and completeness, and I agree with the above. -Worthy Rancher, MD

## 2021-11-23 ENCOUNTER — Other Ambulatory Visit (INDEPENDENT_AMBULATORY_CARE_PROVIDER_SITE_OTHER): Payer: Self-pay

## 2021-11-23 DIAGNOSIS — R7303 Prediabetes: Secondary | ICD-10-CM

## 2021-11-23 MED ORDER — WEGOVY 1.7 MG/0.75ML ~~LOC~~ SOAJ: 1.7000 mg | SUBCUTANEOUS | 0 refills | Status: DC

## 2021-11-27 ENCOUNTER — Other Ambulatory Visit (INDEPENDENT_AMBULATORY_CARE_PROVIDER_SITE_OTHER): Payer: Self-pay | Admitting: Family Medicine

## 2021-11-27 DIAGNOSIS — R7303 Prediabetes: Secondary | ICD-10-CM

## 2021-11-30 ENCOUNTER — Other Ambulatory Visit (INDEPENDENT_AMBULATORY_CARE_PROVIDER_SITE_OTHER): Payer: Self-pay | Admitting: Internal Medicine

## 2021-11-30 ENCOUNTER — Telehealth (INDEPENDENT_AMBULATORY_CARE_PROVIDER_SITE_OTHER): Payer: Self-pay | Admitting: Internal Medicine

## 2021-11-30 DIAGNOSIS — R7303 Prediabetes: Secondary | ICD-10-CM

## 2021-11-30 MED ORDER — WEGOVY 1.7 MG/0.75ML ~~LOC~~ SOAJ: 1.7000 mg | SUBCUTANEOUS | 0 refills | Status: DC

## 2021-11-30 NOTE — Telephone Encounter (Signed)
Pt cld requesting a refill of Wegovy. Pt states that the medication was to be sent in during her last visit, but the pharmacy has not received it. Please call patient at the number provided. Pt is out of the medication.       [facility].

## 2021-11-30 NOTE — Telephone Encounter (Signed)
Phone call to patient to let her know we did send the [facility] refill on 11/21, but it went to the wrong pharmacy confirmed the correct one was [facility] so I am sending in refill.   LAST APPOINTMENT DATE: 11/08/2021 NEXT APPOINTMENT DATE: 12/06/2021   [id number] [place], [place] - [address] AT [facility] & [address] [place] [address] Phone: [phone] Fax: [phone]  [id number] - [place], [place] - [address] [place] [address] Phone: [phone] Fax: [phone]  Patient is requesting a refill of the following medications: Requested Prescriptions   Signed Prescriptions Disp Refills   Semaglutide-Weight Management (WEGOVY) 1.7 MG/0.75ML SOAJ 3 mL 0    Sig: Inject 1.7 mg into the skin once a week.    Authorizing Provider: Worthy Rancher    Ordering User: Cintia Gleed, Cala Bradford S    Date last filled: 11/23/21-was sent to wrong pharmacy Previously prescribed by Dr. Worthy Rancher   Lab Results  Component Value Date   HGBA1C 5.3 06/28/2021   HGBA1C 5.8 (H) 03/04/2021   Lab Results  Component Value Date   LDLCALC 150 (H) 11/08/2021   CREATININE 0.83 06/28/2021   Lab Results  Component Value Date   VD25OH 43.6 03/04/2021    BP Readings from Last 3 Encounters:  11/08/21 124/80  09/16/21 102/67  08/18/21 (!) 93/58

## 2021-12-02 NOTE — Progress Notes (Signed)
TeleHealth Visit:  This visit was completed with telemedicine (audio/video) technology. Kathleen Odonnell has verbally consented to this TeleHealth visit. The patient is located at home, the provider is located at home. The participants in this visit include the listed provider and patient. The visit was conducted today via MyChart video.  OBESITY Kathleen Odonnell is here to discuss her progress with her obesity treatment plan along with follow-up of her obesity related diagnoses.   Today's visit was # 14 Starting weight: 214 lbs Starting date: 03/04/2021 Weight at last in office visit: 178 lbs on 11/08/21 Total weight loss: 36 lbs at last in office visit on 11/08/21. Today's reported weight: No weight reported.  Nutrition Plan: practicing portion control and making smarter food choices, such as increasing vegetables and decreasing simple carbohydrates.   Current exercise:  walking 20-25 minutes 3-5 times per week.  Interim History: Kathleen Odonnell reports that she has had a lot of stress at work and at home over the past few weeks and has been off of her eating plan.  She is drinking one 16 ounce Dr. Reino Kent per day, skipping dinner, and having large portions.  At breakfast she is having two [employer] with 2% milk but thinks she could be satisfied with 1 pack of oatmeal.  At lunch she is having 2 sandwiches with 4 ounces of meat each on 45-calorie bread.  On weekends she does not even eat 2 meals per day.  Denies snacking between meals.  Appetite well controlled on 1.7 mg weekly.  Portion sizes is have increased recently.  Assessment/Plan:  We discussed recent lab results in depth.  1. Hyperlipidemia LDL is not at goal.  Recent lipid profile on 11/08/2021: HDL slightly low at 49, LDL elevated at 150, triglycerides normal at 109. Medication(s): none Cardiovascular risk factors: dyslipidemia and obesity (BMI >= 30 kg/m2) No family history of premature cardiac disease. 10-year ASCVD risk score is 2.9%.  Lab  Results  Component Value Date   CHOL 219 (H) 11/08/2021   HDL 49 11/08/2021   LDLCALC 150 (H) 11/08/2021   TRIG 109 11/08/2021   Lab Results  Component Value Date   ALT 11 06/28/2021   AST 13 06/28/2021   ALKPHOS 80 06/28/2021   BILITOT 0.2 06/28/2021   The 10-year ASCVD risk score (Arnett DK, et al., 2019) is: 2.9%   Values used to calculate the score:     Age: 51 years     Sex: Female     Is Non-Hispanic African American: Yes     Diabetic: No     Tobacco smoker: No     Systolic Blood Pressure: 124 mmHg     Is BP treated: No     HDL Cholesterol: 49 mg/dL     Total Cholesterol: 219 mg/dL  Plan: Continue to work on weight loss and exercise Decrease saturated fats.  Choose leaner cuts of meat and low-fat dairy. Increase fiber intake.  2. Iron deficiency anemia Anemia is stable.  Hemoglobin 12.1 on 11/08/2021, ferritin 78, iron 59. Iron supplementation: Iron 325 mg by mouth daily Lab Results  Component Value Date   WBC 8.5 11/08/2021   HGB 12.1 11/08/2021   HCT 36.3 11/08/2021   MCV 83 11/08/2021   PLT 201 06/28/2007   Lab Results  Component Value Date   IRON 59 11/08/2021   TIBC 294 11/08/2021   FERRITIN 78 11/08/2021     Plan: Discontinue Nu-iron. Recheck anemia panel in 3 to 6 months.  3. Obesity: Current BMI 35.6  Refill Wegovy 1.7 mg weekly.  Consider increasing dose to 2.4 mg at next refill.  Kathleen Odonnell is currently in the action stage of change. As such, her goal is to continue with weight loss efforts.  She has agreed to practicing portion control and making smarter food choices, such as increasing vegetables and decreasing simple carbohydrates.   1.  Try Dr. Reino Kent 0.  If unable to drink Dr. Reino Kent 0, reduce serving of Dr Reino Kent to one 8 ounce can per day or eliminate completely. 2.  Add frozen meal per dinner-less than 300 cal, at least 20 g of protein. 3.  Add at least 1 fruit or vegetable serving per day. Increase water to 5 bottles per day. 4. Try to  have at least 2 meals per day on the weekend. 5.  Have only 1 Kodiak oatmeal with milk at breakfast.  Exercise goals:  as is  Behavioral modification strategies: increasing lean protein intake, decreasing simple carbohydrates, no skipping meals, meal planning and cooking strategies, and planning for success.  Kathleen Odonnell has agreed to follow-up with our clinic in 4 weeks.   No orders of the defined types were placed in this encounter.   Medications Discontinued During This Encounter  Medication Reason   Semaglutide-Weight Management (WEGOVY) 1.7 MG/0.75ML SOAJ Reorder   iron polysaccharides (NU-IRON) 150 MG capsule Discontinued by provider     Meds ordered this encounter  Medications   Semaglutide-Weight Management (WEGOVY) 1.7 MG/0.75ML SOAJ    Sig: Inject 1.7 mg into the skin once a week.    Dispense:  3 mL    Refill:  0    Order Specific Question:   Supervising Provider    Answer:   Glennis Brink [[id number]]      Objective:   VITALS: Per patient if applicable, see vitals. GENERAL: Alert and in no acute distress. CARDIOPULMONARY: No increased WOB. Speaking in clear sentences.  PSYCH: Pleasant and cooperative. Speech normal rate and rhythm. Affect is appropriate. Insight and judgement are appropriate. Attention is focused, linear, and appropriate.  NEURO: Oriented as arrived to appointment on time with no prompting.   Lab Results  Component Value Date   CREATININE 0.83 06/28/2021   BUN 9 06/28/2021   NA 141 06/28/2021   K 3.8 06/28/2021   CL 103 06/28/2021   CO2 22 06/28/2021   Lab Results  Component Value Date   ALT 11 06/28/2021   AST 13 06/28/2021   ALKPHOS 80 06/28/2021   BILITOT 0.2 06/28/2021   Lab Results  Component Value Date   HGBA1C 5.3 06/28/2021   HGBA1C 5.8 (H) 03/04/2021   Lab Results  Component Value Date   INSULIN 11.1 06/28/2021   INSULIN 11.4 03/04/2021   Lab Results  Component Value Date   TSH 1.860 03/04/2021   Lab Results  Component Value  Date   CHOL 219 (H) 11/08/2021   HDL 49 11/08/2021   LDLCALC 150 (H) 11/08/2021   TRIG 109 11/08/2021   Lab Results  Component Value Date   WBC 8.5 11/08/2021   HGB 12.1 11/08/2021   HCT 36.3 11/08/2021   MCV 83 11/08/2021   PLT 201 06/28/2007   Lab Results  Component Value Date   IRON 59 11/08/2021   TIBC 294 11/08/2021   FERRITIN 78 11/08/2021   Lab Results  Component Value Date   VD25OH 43.6 03/04/2021    Attestation Statements:   Reviewed by clinician on day of visit: allergies, medications, problem list, medical history, surgical history,  family history, social history, and previous encounter notes.

## 2021-12-06 ENCOUNTER — Encounter (INDEPENDENT_AMBULATORY_CARE_PROVIDER_SITE_OTHER): Payer: Self-pay | Admitting: Family Medicine

## 2021-12-06 ENCOUNTER — Telehealth (INDEPENDENT_AMBULATORY_CARE_PROVIDER_SITE_OTHER): Payer: BC Managed Care – PPO | Admitting: Family Medicine

## 2021-12-06 DIAGNOSIS — R7303 Prediabetes: Secondary | ICD-10-CM

## 2021-12-06 DIAGNOSIS — Z6835 Body mass index (BMI) 35.0-35.9, adult: Secondary | ICD-10-CM

## 2021-12-06 DIAGNOSIS — Z6841 Body Mass Index (BMI) 40.0 and over, adult: Secondary | ICD-10-CM

## 2021-12-06 DIAGNOSIS — E669 Obesity, unspecified: Secondary | ICD-10-CM

## 2021-12-06 DIAGNOSIS — E785 Hyperlipidemia, unspecified: Secondary | ICD-10-CM | POA: Diagnosis not present

## 2021-12-06 DIAGNOSIS — E7849 Other hyperlipidemia: Secondary | ICD-10-CM

## 2021-12-06 MED ORDER — WEGOVY 1.7 MG/0.75ML ~~LOC~~ SOAJ: 1.7000 mg | SUBCUTANEOUS | 0 refills | Status: DC

## 2021-12-23 ENCOUNTER — Telehealth (INDEPENDENT_AMBULATORY_CARE_PROVIDER_SITE_OTHER): Payer: Self-pay

## 2021-12-23 NOTE — Telephone Encounter (Signed)
(  Key: [id number]) Rx #: [id number] DCVUDT 1.7MG /0.75ML auto-injectors   Your prior authorization for Reginal Lutes has been approved!

## 2021-12-30 ENCOUNTER — Ambulatory Visit (INDEPENDENT_AMBULATORY_CARE_PROVIDER_SITE_OTHER): Payer: BC Managed Care – PPO | Admitting: Internal Medicine

## 2021-12-30 ENCOUNTER — Encounter (INDEPENDENT_AMBULATORY_CARE_PROVIDER_SITE_OTHER): Payer: Self-pay | Admitting: Internal Medicine

## 2021-12-30 VITALS — BP 106/72 | HR 73 | Temp 98.2°F | Ht 59.0 in | Wt 173.6 lb

## 2021-12-30 DIAGNOSIS — Z6835 Body mass index (BMI) 35.0-35.9, adult: Secondary | ICD-10-CM | POA: Diagnosis not present

## 2021-12-30 DIAGNOSIS — R7303 Prediabetes: Secondary | ICD-10-CM | POA: Diagnosis not present

## 2021-12-30 DIAGNOSIS — E669 Obesity, unspecified: Secondary | ICD-10-CM

## 2021-12-30 DIAGNOSIS — E66813 Obesity, class 3: Secondary | ICD-10-CM

## 2021-12-30 MED ORDER — WEGOVY 1.7 MG/0.75ML ~~LOC~~ SOAJ: 1.7000 mg | SUBCUTANEOUS | 0 refills | Status: DC

## 2021-12-30 NOTE — Progress Notes (Signed)
Chief Complaint:   OBESITY Kathleen Odonnell is here to discuss her progress with her obesity treatment plan along with follow-up of her obesity related diagnoses. Kathleen Odonnell is on practicing portion control and making smarter food choices, such as increasing vegetables and decreasing simple carbohydrates and states she is following her eating plan approximately 80% of the time. Kathleen Odonnell states she is walking for 20 minutes 5 times per week.  Today's visit was #: 15 Starting weight: 214 lbs Starting date: 03/04/2021 Today's weight: 173 lbs Today's date: 12/30/2021 Total lbs lost to date: 41 Total lbs lost since last in-office visit: 5  Interim History: Kathleen Odonnell presents today for follow-up since last office visit she has lost 5 pounds.  She is watching portions and making healthy choices.  She reports adequate satiety denies abnormal cravings or eating patterns.  She is on Wegovy 1.7 and is tolerating medication well without any adverse effects.  Bioimpedance shows overall body fat reduction from 49% to 42%.  Her muscle mass is preserved and her visceral rating has gone down from 15-11.  Patient is quite happy with how she feels and with results.  She is concerned about using medications as an adjunct.    Subjective:   1. Prediabetes A1c at 5.3, and improved on GLP-1. No side effects were reported.   Assessment/Plan:   1. Prediabetes Continue weight loss therapy and GLP-1 medication, with 1 month refill.   - Semaglutide-Weight Management (WEGOVY) 1.7 MG/0.75ML SOAJ; Inject 1.7 mg into the skin once a week.  Dispense: 3 mL; Refill: 0  2. Obesity, current BMI 35.1 I reviewed with her today that obesity is a relapsing remitting disease and that sometimes medications are indicated. We also emphasized the importance of getting adequate protein for preservation of muscle mass and for its incretin effect.  We also discussed the benefits of strength training to help maintain weight loss, increase muscle mass and  enhance metabolism.  She will continue [facility] as she is benefiting from medications not experiencing any side effects.  I recommend staying at the lowest effective dose to avoid any side effects that she still responding to medication.  Medication refilled x 1.  - Semaglutide-Weight Management (WEGOVY) 1.7 MG/0.75ML SOAJ; Inject 1.7 mg into the skin once a week.  Dispense: 3 mL; Refill: 0  Kathleen Odonnell is currently in the action stage of change. As such, her goal is to continue with weight loss efforts. She has agreed to practicing portion control and making smarter food choices, such as increasing vegetables and decreasing simple carbohydrates.   Exercise goals: Consider strengthening exercises.   Behavioral modification strategies: increasing lean protein intake, increasing water intake, meal planning and cooking strategies, avoiding temptations, and planning for success.  Kathleen Odonnell has agreed to follow-up with our clinic in 4 weeks. She was informed of the importance of frequent follow-up visits to maximize her success with intensive lifestyle modifications for her multiple health conditions.   Objective:   Blood pressure 106/72, pulse 73, temperature 98.2 F (36.8 C), height 4\' 11"  (1.499 m), weight 173 lb 9.6 oz (78.7 kg), SpO2 100 %. Body mass index is 35.06 kg/m.  General: Cooperative, alert, well developed, in no acute distress. HEENT: Conjunctivae and lids unremarkable. Cardiovascular: Regular rhythm.  Lungs: Normal work of breathing. Neurologic: No focal deficits.   Lab Results  Component Value Date   CREATININE 0.83 06/28/2021   BUN 9 06/28/2021   NA 141 06/28/2021   K 3.8 06/28/2021   CL 103 06/28/2021  CO2 22 06/28/2021   Lab Results  Component Value Date   ALT 11 06/28/2021   AST 13 06/28/2021   ALKPHOS 80 06/28/2021   BILITOT 0.2 06/28/2021   Lab Results  Component Value Date   HGBA1C 5.3 06/28/2021   HGBA1C 5.8 (H) 03/04/2021   Lab Results  Component Value Date    INSULIN 11.1 06/28/2021   INSULIN 11.4 03/04/2021   Lab Results  Component Value Date   TSH 1.860 03/04/2021   Lab Results  Component Value Date   CHOL 219 (H) 11/08/2021   HDL 49 11/08/2021   LDLCALC 150 (H) 11/08/2021   TRIG 109 11/08/2021   Lab Results  Component Value Date   VD25OH 43.6 03/04/2021   Lab Results  Component Value Date   WBC 8.5 11/08/2021   HGB 12.1 11/08/2021   HCT 36.3 11/08/2021   MCV 83 11/08/2021   PLT 201 06/28/2007   Lab Results  Component Value Date   IRON 59 11/08/2021   TIBC 294 11/08/2021   FERRITIN 78 11/08/2021   Attestation Statements:   Reviewed by clinician on day of visit: allergies, medications, problem list, medical history, surgical history, family history, social history, and previous encounter notes.   Trude Mcburney, am acting as transcriptionist for Worthy Rancher, MD.  I have reviewed the above documentation for accuracy and completeness, and I agree with the above. -Worthy Rancher, MD

## 2022-01-24 NOTE — Progress Notes (Signed)
TeleHealth Visit:  This visit was completed with telemedicine (audio/video) technology. Griselda has verbally consented to this TeleHealth visit. The patient is located at home, the provider is located at home. The participants in this visit include the listed provider and patient. The visit was conducted today via MyChart video.  OBESITY Kathleen Odonnell is here to discuss her progress with her obesity treatment plan along with follow-up of her obesity related diagnoses.   Today's visit was # 16 Starting weight: 214 lbs Starting date: 03/04/2021 Weight at last in office visit: 173 lbs on 12/30/21 Total weight loss: 41 lbs at last in office visit on 12/30/21. Today's reported weight: 177 lbs   Nutrition Plan: practicing portion control and making smarter food choices, such as increasing vegetables and decreasing simple carbohydrates.   Current exercise: Zumba or aerobics at work Wednesday and Friday, walking for 20 minutes 5 times per week.  Interim History:  Kathleen Odonnell reports eating 3 meals per day but not getting much protein at lunch or dinner.  She has been eating fast food at lunch and fast food or cereal for dinner.  She reports her weight loss has stalled.  She had been packing her lunch for work but has fallen out of the habit. Her husband generally cooks dinner but has been working in the evenings.  [name] does not like to cook. She has added in aerobics 2 days/week because they are doing a biggest loser challenge and having classes at work. Denies intake of sugar sweetened beverages.  Assessment/Plan:  1. Polyphagia Reports she has had more hunger and cravings recently. Medication(s): Wegovy 1.7 mg weekly.  Denies side effects.  Plan: Increase dose and refill: Wegovy 2.4 mg weekly.   2. Prediabetes Tequia has a diagnosis of prediabetes based on her elevated HgA1c.  A1c has decreased from 5.8 on 03/04/2021  to 5.3 on 06/28/2021. Medication(s): Wegovy 1.7 mg weekly Lab Results   Component Value Date   HGBA1C 5.3 06/28/2021   Lab Results  Component Value Date   INSULIN 11.1 06/28/2021   INSULIN 11.4 03/04/2021    Plan: Continue Wegovy.  Increase dose to 2.4 mg weekly.   3. Obesity: Current BMI 35.1 Kathleen Odonnell is currently in the action stage of change. As such, her goal is to continue with weight loss efforts.  She has agreed to practicing portion control and making smarter food choices, such as increasing vegetables and decreasing simple carbohydrates.   1.  Pack lunch for work daily. 2.  If eating out, use eating out guide and keep dinner less than 500 cal. 3.  May have frozen meal for dinner with at least 20 g of protein and up to 500 cal.   Exercise goals: as is  Behavioral modification strategies: increasing lean protein intake, decreasing simple carbohydrates, decreasing eating out, and meal planning and cooking strategies.  Kathleen Odonnell has agreed to follow-up with our clinic in 4 weeks.   No orders of the defined types were placed in this encounter.   Medications Discontinued During This Encounter  Medication Reason   Semaglutide-Weight Management (WEGOVY) 1.7 MG/0.75ML SOAJ Dose change     Meds ordered this encounter  Medications   Semaglutide-Weight Management (WEGOVY) 2.4 MG/0.75ML SOAJ    Sig: Inject 2.4 mg into the skin once a week.    Dispense:  3 mL    Refill:  0    Order Specific Question:   Supervising Provider    Answer:   Netty Starring  Objective:   VITALS: Per patient if applicable, see vitals. GENERAL: Alert and in no acute distress. CARDIOPULMONARY: No increased WOB. Speaking in clear sentences.  PSYCH: Pleasant and cooperative. Speech normal rate and rhythm. Affect is appropriate. Insight and judgement are appropriate. Attention is focused, linear, and appropriate.  NEURO: Oriented as arrived to appointment on time with no prompting.   Lab Results  Component Value Date   CREATININE 0.83 06/28/2021   BUN 9  06/28/2021   NA 141 06/28/2021   K 3.8 06/28/2021   CL 103 06/28/2021   CO2 22 06/28/2021   Lab Results  Component Value Date   ALT 11 06/28/2021   AST 13 06/28/2021   ALKPHOS 80 06/28/2021   BILITOT 0.2 06/28/2021   Lab Results  Component Value Date   HGBA1C 5.3 06/28/2021   HGBA1C 5.8 (H) 03/04/2021   Lab Results  Component Value Date   INSULIN 11.1 06/28/2021   INSULIN 11.4 03/04/2021   Lab Results  Component Value Date   TSH 1.860 03/04/2021   Lab Results  Component Value Date   CHOL 219 (H) 11/08/2021   HDL 49 11/08/2021   LDLCALC 150 (H) 11/08/2021   TRIG 109 11/08/2021   Lab Results  Component Value Date   WBC 8.5 11/08/2021   HGB 12.1 11/08/2021   HCT 36.3 11/08/2021   MCV 83 11/08/2021   PLT 201 06/28/2007   Lab Results  Component Value Date   IRON 59 11/08/2021   TIBC 294 11/08/2021   FERRITIN 78 11/08/2021   Lab Results  Component Value Date   VD25OH 43.6 03/04/2021    Attestation Statements:   Reviewed by clinician on day of visit: allergies, medications, problem list, medical history, surgical history, family history, social history, and previous encounter notes.

## 2022-01-25 ENCOUNTER — Telehealth (HOSPITAL_BASED_OUTPATIENT_CLINIC_OR_DEPARTMENT_OTHER): Payer: BC Managed Care – PPO | Admitting: Psychiatry

## 2022-01-25 ENCOUNTER — Encounter (HOSPITAL_COMMUNITY): Payer: Self-pay | Admitting: Psychiatry

## 2022-01-25 VITALS — Wt 176.0 lb

## 2022-01-25 DIAGNOSIS — F331 Major depressive disorder, recurrent, moderate: Secondary | ICD-10-CM

## 2022-01-25 DIAGNOSIS — F4310 Post-traumatic stress disorder, unspecified: Secondary | ICD-10-CM | POA: Diagnosis not present

## 2022-01-25 MED ORDER — CITALOPRAM HYDROBROMIDE 20 MG PO TABS: 20.0000 mg | ORAL_TABLET | Freq: Every day | ORAL | 0 refills | Status: DC

## 2022-01-25 MED ORDER — TRAZODONE HCL 100 MG PO TABS: 100.0000 mg | ORAL_TABLET | Freq: Every day | ORAL | 0 refills | Status: DC
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
## 2022-01-26 ENCOUNTER — Telehealth (INDEPENDENT_AMBULATORY_CARE_PROVIDER_SITE_OTHER): Payer: BC Managed Care – PPO | Admitting: Family Medicine

## 2022-01-26 ENCOUNTER — Encounter (INDEPENDENT_AMBULATORY_CARE_PROVIDER_SITE_OTHER): Payer: Self-pay | Admitting: Family Medicine

## 2022-01-26 DIAGNOSIS — E669 Obesity, unspecified: Secondary | ICD-10-CM | POA: Diagnosis not present

## 2022-01-26 DIAGNOSIS — R632 Polyphagia: Secondary | ICD-10-CM | POA: Diagnosis not present

## 2022-01-26 DIAGNOSIS — R7303 Prediabetes: Secondary | ICD-10-CM | POA: Diagnosis not present

## 2022-01-26 DIAGNOSIS — Z6835 Body mass index (BMI) 35.0-35.9, adult: Secondary | ICD-10-CM

## 2022-01-26 DIAGNOSIS — E66813 Obesity, class 3: Secondary | ICD-10-CM

## 2022-01-26 MED ORDER — WEGOVY 2.4 MG/0.75ML ~~LOC~~ SOAJ: 2.4000 mg | SUBCUTANEOUS | 0 refills | Status: DC

## 2022-02-21 ENCOUNTER — Encounter (INDEPENDENT_AMBULATORY_CARE_PROVIDER_SITE_OTHER): Payer: Self-pay | Admitting: Internal Medicine

## 2022-02-21 ENCOUNTER — Ambulatory Visit (INDEPENDENT_AMBULATORY_CARE_PROVIDER_SITE_OTHER): Payer: BC Managed Care – PPO | Admitting: Internal Medicine

## 2022-02-21 VITALS — BP 103/67 | HR 69 | Temp 98.1°F | Ht 59.0 in | Wt 169.0 lb

## 2022-02-21 DIAGNOSIS — R7303 Prediabetes: Secondary | ICD-10-CM

## 2022-02-21 DIAGNOSIS — Z6835 Body mass index (BMI) 35.0-35.9, adult: Secondary | ICD-10-CM | POA: Diagnosis not present

## 2022-02-21 DIAGNOSIS — R632 Polyphagia: Secondary | ICD-10-CM | POA: Insufficient documentation

## 2022-02-21 DIAGNOSIS — E7800 Pure hypercholesterolemia, unspecified: Secondary | ICD-10-CM | POA: Diagnosis not present

## 2022-02-21 DIAGNOSIS — E669 Obesity, unspecified: Secondary | ICD-10-CM | POA: Diagnosis not present

## 2022-02-21 MED ORDER — WEGOVY 2.4 MG/0.75ML ~~LOC~~ SOAJ: 2.4000 mg | SUBCUTANEOUS | 0 refills | Status: DC

## 2022-02-21 NOTE — Progress Notes (Signed)
Office: [phone]  /  Fax: [phone]  WEIGHT SUMMARY AND BIOMETRICS  Medical Weight Loss Height: 4' 11"$  (1.499 m) Weight: 169 lb (76.7 kg) Temp: 98.1 F (36.7 C) Pulse Rate: 69 BP: 103/67 SpO2: 97 % Fasting: Yes Labs: No Today's Visit #: 16 Weight at Last VIsit: 173 lb Weight Lost Since Last Visit: 4 lb  Body Fat %: 41.7 % Fat Mass (lbs): 70.8 lbs Muscle Mass (lbs): 93.8 lbs Total Body Water (lbs): 68 lbs Visceral Fat Rating : 11 Peak Weight: 225 lb Starting Date: 03/04/21 Starting Weight: 214 lb Total Weight Loss (lbs): 45 lb (20.4 kg)    HPI  Chief Complaint: OBESITY  Kathleen Odonnell is here to discuss her progress with her obesity treatment plan. She is on the practicing portion control and making smarter food choices, such as increasing vegetables and decreasing simple carbohydrates and states she is following her eating plan approximately 70 % of the time. She states she is exercising 30 minutes 5 times per week.   Interval History:  Since last office visit she has lost 4 lbs. She continues to eat at times convenience foods and acknowledges that she could do better.  Has been working on drinking more water and journaling but journaling has been inadequate with just a few days.  Her Mancel Parsons was recently increased.  She denies any adverse effects.  She denies skipping meals.  Denies problems with appetite and hunger signals.  Denies problems with satiety and satiation.  Denies problems with eating patterns and portion control.  Barriers identified inability to cook healthy meals.    Pharmacotherapy: Wegovy 2.4 once a week  PHYSICAL EXAM:  Blood pressure 103/67, pulse 69, temperature 98.1 F (36.7 C), height 4' 11"$  (1.499 m), weight 169 lb (76.7 kg), SpO2 97 %. Body mass index is 34.13 kg/m.  General: She is overweight, cooperative, alert, well developed, and in no acute distress. PSYCH: Has normal mood, affect and thought process.   HEENT: EOMI, sclerae are  anicteric. Lungs: Normal breathing effort, no conversational dyspnea. Extremities: No edema.  Neurologic: No gross sensory or motor deficits. No tremors or fasciculations noted.    ASSESSMENT AND PLAN  TREATMENT PLAN FOR OBESITY:  Recommended Dietary Goals  Kathleen Odonnell is currently in the action stage of change. As such, her goal is to continue weight management plan. She has agreed to practicing portion control and making smarter food choices, such as increasing vegetables and decreasing simple carbohydrates.  Behavioral Intervention  We discussed the following Behavioral Modification Strategies today: increasing lean protein intake, decreasing simple carbohydrates , increasing vegetables, increasing lower sugar fruits, increasing fiber rich foods, increase water intake, work on meal planning and easy cooking plans, avoid or reduce consumption of processed foods, reading food labels and making healthy choices when eating convenient foods, and work on tracking and journaling calories using tracking App.  Additional resources provided today: NA  Recommended Physical Activity Goals  Kathleen Odonnell has been advised to work up to 150 minutes of moderate intensity aerobic activity a week and strengthening exercises 2-3 times per week for cardiovascular health, weight loss maintenance and preservation of muscle mass.   She has agreed to continue physical activity as is.    Pharmacotherapy We discussed various medication options to help Kathleen Odonnell with her weight loss efforts and we both agreed to continue with Wegovy 2.4 mg weekly.  No side effects reported.  Medication refilled today  ASSOCIATED CONDITIONS ADDRESSED TODAY  Prediabetes Assessment & Plan: Most recent A1c is  Lab Results  Component Value Date   HGBA1C 5.3 06/28/2021  and improved.  Patient informed of disease state and risk of progression. This may contribute to abnormal cravings, fatigue and diabetes complications without having diabetes.    Continue with weight loss and incretin therapy.  Patient also counseled on reducing simple sugars and added sugars in her diet.  We reviewed reading of food labels.    Obesity: Current BMI 35.1 -     Wegovy; Inject 2.4 mg into the skin once a week.  Dispense: 3 mL; Refill: 0  Pure hypercholesterolemia Assessment & Plan: LDL is not at goal. Elevated LDL may be secondary to nutrition, genetics and spillover effect from excess adiposity. Recommended LDL goal is <70 to reduce the risk of fatty streaks and the progression to obstructive ASCVD in the future. Her 10 year risk is: The 10-year ASCVD risk score (Arnett DK, et al., 2019) is: 1.5%  Lab Results  Component Value Date   CHOL 219 (H) 11/08/2021   HDL 49 11/08/2021   LDLCALC 150 (H) 11/08/2021   TRIG 109 11/08/2021    Continue weight loss therapy. Also advised to reduce saturated fats in diet to less than 10% of daily calories.  Repeat fasting lipid panel at the next office visit          DIAGNOSTIC DATA REVIEWED:  BMET    Component Value Date/Time   NA 141 06/28/2021 1009   K 3.8 06/28/2021 1009   CL 103 06/28/2021 1009   CO2 22 06/28/2021 1009   GLUCOSE 92 06/28/2021 1009   BUN 9 06/28/2021 1009   CREATININE 0.83 06/28/2021 1009   CALCIUM 8.8 06/28/2021 1009   Lab Results  Component Value Date   HGBA1C 5.3 06/28/2021   HGBA1C 5.8 (H) 03/04/2021   Lab Results  Component Value Date   INSULIN 11.1 06/28/2021   INSULIN 11.4 03/04/2021   Lab Results  Component Value Date   TSH 1.860 03/04/2021   CBC    Component Value Date/Time   WBC 8.5 11/08/2021 0803   WBC 12.6 (H) 06/28/2007 0550   RBC 4.40 11/08/2021 0803   RBC 3.09 (L) 06/28/2007 0550   HGB 12.1 11/08/2021 0803   HCT 36.3 11/08/2021 0803   PLT 201 06/28/2007 0550   MCV 83 11/08/2021 0803   MCH 27.5 11/08/2021 0803   MCHC 33.3 11/08/2021 0803   MCHC 33.8 06/28/2007 0550   RDW 15.6 (H) 11/08/2021 0803   Iron Studies    Component Value  Date/Time   IRON 59 11/08/2021 0803   TIBC 294 11/08/2021 0803   FERRITIN 78 11/08/2021 0803   IRONPCTSAT 20 11/08/2021 0803   Lipid Panel     Component Value Date/Time   CHOL 219 (H) 11/08/2021 0803   TRIG 109 11/08/2021 0803   HDL 49 11/08/2021 0803   LDLCALC 150 (H) 11/08/2021 0803   Hepatic Function Panel     Component Value Date/Time   PROT 7.4 06/28/2021 1009   ALBUMIN 4.1 06/28/2021 1009   AST 13 06/28/2021 1009   ALT 11 06/28/2021 1009   ALKPHOS 80 06/28/2021 1009   BILITOT 0.2 06/28/2021 1009      Component Value Date/Time   TSH 1.860 03/04/2021 0953   Nutritional Lab Results  Component Value Date   VD25OH 43.6 03/04/2021      Return in about 2 months (around 04/22/2022) for For Weight Mangement with Dr. Gerarda Fraction.[name] She was informed of the importance of frequent follow up visits  to maximize her success with intensive lifestyle modifications for her multiple health conditions.    ATTESTASTION STATEMENTS:  Reviewed by clinician on day of visit: allergies, medications, problem list, medical history, surgical history, family history, social history, and previous encounter notes.   Time spent on visit including pre-visit chart review and post-visit care and charting was 30 minutes.    Thomes Dinning, MD

## 2022-02-21 NOTE — Assessment & Plan Note (Signed)
LDL is not at goal. Elevated LDL may be secondary to nutrition, genetics and spillover effect from excess adiposity. Recommended LDL goal is <70 to reduce the risk of fatty streaks and the progression to obstructive ASCVD in the future. Her 10 year risk is: The 10-year ASCVD risk score (Arnett DK, et al., 2019) is: 1.5%  Lab Results  Component Value Date   CHOL 219 (H) 11/08/2021   HDL 49 11/08/2021   LDLCALC 150 (H) 11/08/2021   TRIG 109 11/08/2021    Continue weight loss therapy. Also advised to reduce saturated fats in diet to less than 10% of daily calories.  Repeat fasting lipid panel at the next office visit

## 2022-02-21 NOTE — Assessment & Plan Note (Signed)
Most recent A1c is  Lab Results  Component Value Date   HGBA1C 5.3 06/28/2021  and improved.  Patient informed of disease state and risk of progression. This may contribute to abnormal cravings, fatigue and diabetes complications without having diabetes.   Continue with weight loss and incretin therapy.  Patient also counseled on reducing simple sugars and added sugars in her diet.  We reviewed reading of food labels.

## 2022-03-01 ENCOUNTER — Other Ambulatory Visit: Payer: Self-pay | Admitting: Family Medicine

## 2022-03-01 DIAGNOSIS — Z1231 Encounter for screening mammogram for malignant neoplasm of breast: Secondary | ICD-10-CM

## 2022-03-10 NOTE — Progress Notes (Signed)
TeleHealth Visit:  This visit was completed with telemedicine (audio/video) technology. Kathleen Odonnell has verbally consented to this TeleHealth visit. The patient is located at home, the provider is located at home. The participants in this visit include the listed provider and patient. The visit was conducted today via MyChart video.  OBESITY Kathleen Odonnell is here to discuss her progress with her obesity treatment plan along with follow-up of her obesity related diagnoses.   Today's visit was # 54 Starting Date: 03/04/21 Starting Weight: 214 lb Weight at last in office visit: 169 lbs on 02/21/22 Total weight loss: 45 lbs at last in office visit on 02/21/22. Today's reported weight:  No weight reported.  Nutrition Plan: practicing portion control and making smarter food choices, such as increasing vegetables and decreasing simple carbohydrates - poor adherence.  Current exercise:  30 minutes 5 days/week  Interim History:  Kathleen Odonnell has been feeling depressed recently and is not eating in a healthy manner.  She sometimes eats only 1 meal a day.  She is consistent with breakfast but may skip lunch and dinner.  She says this is due to depression.  She is drinking soda and sweet tea which is a comfort to her.  Drinking very little water. 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  Eating all of the prescribed protein: no Skipping meals: Yes Drinking adequate water: No Drinking sugar sweetened beverages: Yes-soda and sweet tea   Pharmacotherapy: Kathleen Odonnell is on Wegovy 2.4 mg SQ weekly Adverse side effects: None Hunger is well controlled.  Cravings are well controlled.   She was unaware that she will not have coverage for [facility] after April 1.  Advised her she can discuss options with Dr. Gerarda Fraction at next visit. Assessment/Plan:  1. Other depression Currently struggling with depression due to situations in her personal life.  She sees psychiatry (Dr. Adele Schilder) every  3 months and her counselor as needed.  Has appointment with Dr. Adele Schilder on 04/26/2022.  Has no counseling appointment set up but plans on making an appointment.  Denies suicidal/homicidal ideation. Her walk at lunch improves her mood. Medication(s): Abilify 2 mg daily, Celexa 20 mg daily.  Compliant with medications.   Plan: Make counseling appointment as soon as poss goal. Follow-up with Dr. Adele Schilder at scheduled appointment time or sooner if needed. Continue all medications at current dosages. Continue walking at lunch.  2. Prediabetes Last A1c was 5.3 on 06/28/2021, down from 5.8 on 03/04/2021. Medication(s):  Wegovy 2.4 mg SQ weekly Lab Results  Component Value Date   HGBA1C 5.3 06/28/2021   HGBA1C 5.8 (H) 03/04/2021   Lab Results  Component Value Date   INSULIN 11.1 06/28/2021   INSULIN 11.4 03/04/2021    Plan: Continue Wegovy 2.4 mg weekly Check labs in near future.  3. Morbid Obesity: Current BMI 34 Pharmacotherapy Plan Continue and refill  Wegovy 2.4 mg SQ weekly Kathleen Odonnell is currently in the action stage of change. As such, her goal is to continue with weight loss efforts.  She has agreed to practicing portion control and making smarter food choices, such as increasing vegetables and decreasing simple carbohydrates.  1.  Aim for at least 2 meals per day and 1 protein shake or bar. 2.  Discussed taking frozen meal for lunch. 3.  Cut back on sweet tea and soda. 4.  Drink 8 ounces of water with pills in the morning, 12 more ounces before lunch, 12 ounces after lunch, 8 ounces in the evening.  Exercise goals:  as is  Behavioral modification strategies: increasing lean protein intake, decreasing simple carbohydrates , no meal skipping, meal planning , and decrease liquid calories.  Kathleen Odonnell has agreed to follow-up with our clinic in 4 weeks.   No orders of the defined types were placed in this encounter.   Medications Discontinued During This Encounter  Medication Reason    Semaglutide-Weight Management (WEGOVY) 2.4 MG/0.75ML SOAJ Reorder     Meds ordered this encounter  Medications   Semaglutide-Weight Management (WEGOVY) 2.4 MG/0.75ML SOAJ    Sig: Inject 2.4 mg into the skin once a week.    Dispense:  3 mL    Refill:  0    Order Specific Question:   Supervising Provider    Answer:   Dell Ponto [[id number]]      Objective:   VITALS: Per patient if applicable, see vitals. GENERAL: Alert and in no acute distress. CARDIOPULMONARY: No increased WOB. Speaking in clear sentences.  PSYCH: Pleasant and cooperative. Speech normal rate and rhythm. Affect is appropriate. Insight and judgement are appropriate. Attention is focused, linear, and appropriate.  NEURO: Oriented as arrived to appointment on time with no prompting.   Attestation Statements:   Reviewed by clinician on day of visit: allergies, medications, problem list, medical history, surgical history, family history, social history, and previous encounter notes.   This was prepared with the assistance of Presenter, broadcasting.  Occasional wrong-word or sound-a-like substitutions may have occurred due to the inherent limitations of voice recognition software.

## 2022-03-14 ENCOUNTER — Telehealth (INDEPENDENT_AMBULATORY_CARE_PROVIDER_SITE_OTHER): Payer: BC Managed Care – PPO | Admitting: Family Medicine

## 2022-03-14 ENCOUNTER — Encounter (INDEPENDENT_AMBULATORY_CARE_PROVIDER_SITE_OTHER): Payer: Self-pay | Admitting: Family Medicine

## 2022-03-14 DIAGNOSIS — R7303 Prediabetes: Secondary | ICD-10-CM

## 2022-03-14 DIAGNOSIS — F3289 Other specified depressive episodes: Secondary | ICD-10-CM

## 2022-03-14 DIAGNOSIS — Z6834 Body mass index (BMI) 34.0-34.9, adult: Secondary | ICD-10-CM | POA: Diagnosis not present

## 2022-03-14 MED ORDER — WEGOVY 2.4 MG/0.75ML ~~LOC~~ SOAJ: 2.4000 mg | SUBCUTANEOUS | 0 refills | Status: DC

## 2022-04-04 ENCOUNTER — Other Ambulatory Visit: Payer: Self-pay | Admitting: Pain Medicine

## 2022-04-04 ENCOUNTER — Ambulatory Visit
Admission: RE | Admit: 2022-04-04 | Discharge: 2022-04-04 | Disposition: A | Discharge status: Home / Self Care | Payer: BC Managed Care – PPO | Source: Ambulatory Visit | Attending: Pain Medicine | Admitting: Pain Medicine

## 2022-04-04 DIAGNOSIS — M79644 Pain in right finger(s): Secondary | ICD-10-CM

## 2022-04-12 ENCOUNTER — Ambulatory Visit (INDEPENDENT_AMBULATORY_CARE_PROVIDER_SITE_OTHER): Payer: BC Managed Care – PPO | Admitting: Internal Medicine

## 2022-04-13 ENCOUNTER — Ambulatory Visit
Admission: RE | Admit: 2022-04-13 | Discharge: 2022-04-13 | Disposition: A | Discharge status: Home / Self Care | Payer: BC Managed Care – PPO | Source: Ambulatory Visit | Attending: Family Medicine | Admitting: Family Medicine

## 2022-04-13 DIAGNOSIS — Z1231 Encounter for screening mammogram for malignant neoplasm of breast: Secondary | ICD-10-CM

## 2022-04-21 ENCOUNTER — Encounter (INDEPENDENT_AMBULATORY_CARE_PROVIDER_SITE_OTHER): Payer: Self-pay | Admitting: Internal Medicine

## 2022-04-21 ENCOUNTER — Ambulatory Visit (INDEPENDENT_AMBULATORY_CARE_PROVIDER_SITE_OTHER): Payer: BC Managed Care – PPO | Admitting: Internal Medicine

## 2022-04-21 VITALS — BP 98/65 | HR 68 | Temp 98.3°F | Ht 59.0 in | Wt 167.0 lb

## 2022-04-21 DIAGNOSIS — E559 Vitamin D deficiency, unspecified: Secondary | ICD-10-CM

## 2022-04-21 DIAGNOSIS — E7800 Pure hypercholesterolemia, unspecified: Secondary | ICD-10-CM

## 2022-04-21 DIAGNOSIS — R7303 Prediabetes: Secondary | ICD-10-CM

## 2022-04-21 DIAGNOSIS — Z6833 Body mass index (BMI) 33.0-33.9, adult: Secondary | ICD-10-CM

## 2022-04-21 NOTE — Assessment & Plan Note (Signed)
Most recent A1c is  Lab Results  Component Value Date   HGBA1C 5.3 06/28/2021  and improved.  Patient informed of disease state and risk of progression. This may contribute to abnormal cravings, fatigue and diabetes complications without having diabetes.   Continue with nutritional and behavioral strategies.  She will be transitioning from GLP-1 to metformin in the near future.

## 2022-04-21 NOTE — Assessment & Plan Note (Signed)
LDL is not at goal. Elevated LDL may be secondary to nutrition, genetics and spillover effect from excess adiposity. Recommended LDL goal is <70 to reduce the risk of fatty streaks and the progression to obstructive ASCVD in the future. Her 10 year risk is: The 10-year ASCVD risk score (Arnett DK, et al., 2019) is: 1.2%  Lab Results  Component Value Date   CHOL 219 (H) 11/08/2021   HDL 49 11/08/2021   LDLCALC 150 (H) 11/08/2021   TRIG 109 11/08/2021    Continue weight loss therapy. Also advised to reduce saturated fats in diet to less than 10% of daily calories.  Check fasting lipid panel today

## 2022-04-21 NOTE — Progress Notes (Signed)
Office: [phone]  /  Fax: [phone]  WEIGHT SUMMARY AND BIOMETRICS  Vitals Temp: 98.3 F (36.8 C) BP: 98/65 Pulse Rate: 68 SpO2: 100 %   Anthropometric Measurements Height:  (1.499 m) Weight: 167 lb (75.8 kg) BMI (Calculated): 33.71 Weight at Last Visit: 169 lb Weight Lost Since Last Visit: 2 lb Starting Weight: 214 lb Total Weight Loss (lbs): 47 lb (21.3 kg) Peak Weight: 225 lb   Body Composition  Body Fat %: 43.1 % Fat Mass (lbs): 72.2 lbs Muscle Mass (lbs): 90.6 lbs Total Body Water (lbs): 70.8 lbs Visceral Fat Rating : 11    No data recorded Today's Visit #: 17  Starting Date: 03/05/21   HPI  Chief Complaint: OBESITY  Kathleen Odonnell is here to discuss her progress with her obesity treatment plan. She is on the practicing portion control and making smarter food choices, such as increasing vegetables and decreasing simple carbohydrates and states she is following her eating plan approximately 60 % of the time. She states she is exercising 20-30 minutes 4 times per week.  Interval History:  Since last office visit she has lost 2 lbs. Her Reginal Lutes was increased by our nurse practitioner.  Patient reports experiencing nausea, diarrhea and also constipation.  She has been working on reducing portion sizes and reducing  eating out Denies problems with appetite and hunger signals.  Denies problems with satiety and satiation.  Denies problems with eating patterns and portion control.  Denies abnormal cravings. Denies feeling deprived or restricted.   Barriers identified: having difficulty preparing healthy meals, inability to focus on healthy eating, and predilection for convenience or prepackaged foods.   Pharmacotherapy for weight loss: She is currently taking Wegovy.    ASSESSMENT AND PLAN  TREATMENT PLAN FOR OBESITY:  Recommended Dietary Goals  Kathleen Odonnell is currently in the action stage of change. As such, her goal is to continue weight management  plan. She has agreed to: continue current plan  Behavioral Intervention  We discussed the following Behavioral Modification Strategies today: increasing lean protein intake, decreasing simple carbohydrates , increasing vegetables, increasing lower glycemic fruits, increasing water intake, reading food labels , decreasing eating out or consumption of processed foods, and making healthy choices when eating convenient foods, continue to practice mindfulness when eating, and planning for success.  Additional resources provided today: None  Recommended Physical Activity Goals  Kathleen Odonnell has been advised to work up to 150 minutes of moderate intensity aerobic activity a week and strengthening exercises 2-3 times per week for cardiovascular health, weight loss maintenance and preservation of muscle mass.   She has agreed to :  Think about ways to increase physical activity  Pharmacotherapy We discussed various medication options to help Kathleen Odonnell with her weight loss efforts and we both agreed to : continue current anti-obesity medication regimen but may have to reduced in the short-term.  She is not tolerating the 2.4 mg dose.  Her insurance is no longer covering medication so this will be an issue we does have to transition her to other antiobesity medications but I would like to do this transition in the office so we could assess response to therapy.  I would likely do step therapy with metformin and then add Qsymia if we do not have adequate control of hunger signals and satiety.  She has done very well on incretin therapy with a reduction of 22% of baseline body weight which is impressive.  ASSOCIATED CONDITIONS ADDRESSED TODAY  Prediabetes Assessment & Plan: Most recent  A1c is  Lab Results  Component Value Date   HGBA1C 5.3 06/28/2021  and improved.  Patient informed of disease state and risk of progression. This may contribute to abnormal cravings, fatigue and diabetes complications without having  diabetes.   Continue with nutritional and behavioral strategies.  She will be transitioning from GLP-1 to metformin in the near future.   Orders: -     CMP14+EGFR -     Hemoglobin A1c -     Insulin, random  Vitamin D deficiency -     VITAMIN D 25 Hydroxy (Vit-D Deficiency, Fractures)  Morbid obesity Assessment & Plan: Patient has lost 22% of baseline body weight.  Her insurance no longer covers incretin therapy and she may require additional antiobesity medications to maintain her weight loss.  She will complete current supply and we will then transition her to metformin as a first step and we could also add Qsymia down the line if needed.  I would also like to spend time with her as I do not know her as well and review some of the principles and nutritional concepts as maintaining a reduced calorie state is going to be important for long-term success.   Pure hypercholesterolemia Assessment & Plan: LDL is not at goal. Elevated LDL may be secondary to nutrition, genetics and spillover effect from excess adiposity. Recommended LDL goal is <70 to reduce the risk of fatty streaks and the progression to obstructive ASCVD in the future. Her 10 year risk is: The 10-year ASCVD risk score (Arnett DK, et al., 2019) is: 1.2%  Lab Results  Component Value Date   CHOL 219 (H) 11/08/2021   HDL 49 11/08/2021   LDLCALC 150 (H) 11/08/2021   TRIG 109 11/08/2021    Continue weight loss therapy. Also advised to reduce saturated fats in diet to less than 10% of daily calories.  Check fasting lipid panel today   Orders: -     Lipid Panel With LDL/HDL Ratio     PHYSICAL EXAM:  Blood pressure 98/65, pulse 68, temperature 98.3 F (36.8 C), height 4\' 11"  (1.499 m), weight 167 lb (75.8 kg), SpO2 100 %. Body mass index is 33.73 kg/m.  General: She is overweight, cooperative, alert, well developed, and in no acute distress. PSYCH: Has normal mood, affect and thought process.   HEENT: EOMI, sclerae  are anicteric. Lungs: Normal breathing effort, no conversational dyspnea. Extremities: No edema.  Neurologic: No gross sensory or motor deficits. No tremors or fasciculations noted.    DIAGNOSTIC DATA REVIEWED:  BMET    Component Value Date/Time   NA 141 06/28/2021 1009   K 3.8 06/28/2021 1009   CL 103 06/28/2021 1009   CO2 22 06/28/2021 1009   GLUCOSE 92 06/28/2021 1009   BUN 9 06/28/2021 1009   CREATININE 0.83 06/28/2021 1009   CALCIUM 8.8 06/28/2021 1009   Lab Results  Component Value Date   HGBA1C 5.3 06/28/2021   HGBA1C 5.8 (H) 03/04/2021   Lab Results  Component Value Date   INSULIN 11.1 06/28/2021   INSULIN 11.4 03/04/2021   Lab Results  Component Value Date   TSH 1.860 03/04/2021   CBC    Component Value Date/Time   WBC 8.5 11/08/2021 0803   WBC 12.6 (H) 06/28/2007 0550   RBC 4.40 11/08/2021 0803   RBC 3.09 (L) 06/28/2007 0550   HGB 12.1 11/08/2021 0803   HCT 36.3 11/08/2021 0803   PLT 201 06/28/2007 0550   MCV 83 11/08/2021 0803  MCH 27.5 11/08/2021 0803   MCHC 33.3 11/08/2021 0803   MCHC 33.8 06/28/2007 0550   RDW 15.6 (H) 11/08/2021 0803   Iron Studies    Component Value Date/Time   IRON 59 11/08/2021 0803   TIBC 294 11/08/2021 0803   FERRITIN 78 11/08/2021 0803   IRONPCTSAT 20 11/08/2021 0803   Lipid Panel     Component Value Date/Time   CHOL 219 (H) 11/08/2021 0803   TRIG 109 11/08/2021 0803   HDL 49 11/08/2021 0803   LDLCALC 150 (H) 11/08/2021 0803   Hepatic Function Panel     Component Value Date/Time   PROT 7.4 06/28/2021 1009   ALBUMIN 4.1 06/28/2021 1009   AST 13 06/28/2021 1009   ALT 11 06/28/2021 1009   ALKPHOS 80 06/28/2021 1009   BILITOT 0.2 06/28/2021 1009      Component Value Date/Time   TSH 1.860 03/04/2021 0953   Nutritional Lab Results  Component Value Date   VD25OH 43.6 03/04/2021     Return in about 4 weeks (around 05/19/2022) for For Weight Mangement with Dr. Rikki Odonnell - 40 MINUTES- Medication  transition.Kathleen Odonnell Kitchen She was informed of the importance of frequent follow up visits to maximize her success with intensive lifestyle modifications for her multiple health conditions.   ATTESTASTION STATEMENTS:  Reviewed by clinician on day of visit: allergies, medications, problem list, medical history, surgical history, family history, social history, and previous encounter notes.     Worthy Rancher, MD

## 2022-04-21 NOTE — Assessment & Plan Note (Signed)
Patient has lost 22% of baseline body weight.  Her insurance no longer covers incretin therapy and she may require additional antiobesity medications to maintain her weight loss.  She will complete current supply and we will then transition her to metformin as a first step and we could also add Qsymia down the line if needed.  I would also like to spend time with her as I do not know her as well and review some of the principles and nutritional concepts as maintaining a reduced calorie state is going to be important for long-term success.

## 2022-04-23 LAB — LIPID PANEL WITH LDL/HDL RATIO
Cholesterol, Total: 184 mg/dL (ref 100–199)
HDL: 51 mg/dL (ref >39)
LDL Chol Calc (NIH): 120 mg/dL — ABNORMAL HIGH (ref 0–99)
LDL/HDL Ratio: 2.4 ratio (ref 0.0–3.2)
Triglycerides: 70 mg/dL (ref 0–149)
VLDL Cholesterol Cal: 13 mg/dL (ref 5–40)

## 2022-04-23 LAB — CMP14+EGFR
ALT: 8 IU/L (ref 0–32)
AST: 15 IU/L (ref 0–40)
Albumin/Globulin Ratio: 1.2 (ref 1.2–2.2)
Albumin: 3.9 g/dL (ref 3.8–4.9)
Alkaline Phosphatase: 80 IU/L (ref 44–121)
BUN/Creatinine Ratio: 14 (ref 9–23)
BUN: 10 mg/dL (ref 6–24)
Bilirubin Total: <0.2 mg/dL (ref 0.0–1.2)
CO2: 21 mmol/L (ref 20–29)
Calcium: 8.6 mg/dL — ABNORMAL LOW (ref 8.7–10.2)
Chloride: 103 mmol/L (ref 96–106)
Creatinine, Ser: 0.71 mg/dL (ref 0.57–1.00)
Globulin, Total: 3.2 g/dL (ref 1.5–4.5)
Glucose: 79 mg/dL (ref 70–99)
Potassium: 4 mmol/L (ref 3.5–5.2)
Sodium: 142 mmol/L (ref 134–144)
Total Protein: 7.1 g/dL (ref 6.0–8.5)
eGFR: 102 mL/min/{1.73_m2} (ref >59)

## 2022-04-23 LAB — HEMOGLOBIN A1C
Est. average glucose Bld gHb Est-mCnc: 103 mg/dL
Hgb A1c MFr Bld: 5.2 % (ref 4.8–5.6)

## 2022-04-23 LAB — INSULIN, RANDOM: INSULIN: 4.6 u[IU]/mL (ref 2.6–24.9)

## 2022-04-23 LAB — VITAMIN D 25 HYDROXY (VIT D DEFICIENCY, FRACTURES): Vit D, 25-Hydroxy: 47.5 ng/mL (ref 30.0–100.0)

## 2022-04-25 IMAGING — DX DG KNEE 3 VIEWS*R*
3 series · 3 of 3 positions shown · non-contrast
Comparison: None.

CLINICAL DATA: Pain in both knees, unspecified chronicity

EXAM:
RIGHT KNEE - 3 VIEW

[dg knee 3 views right (1 of 3)]
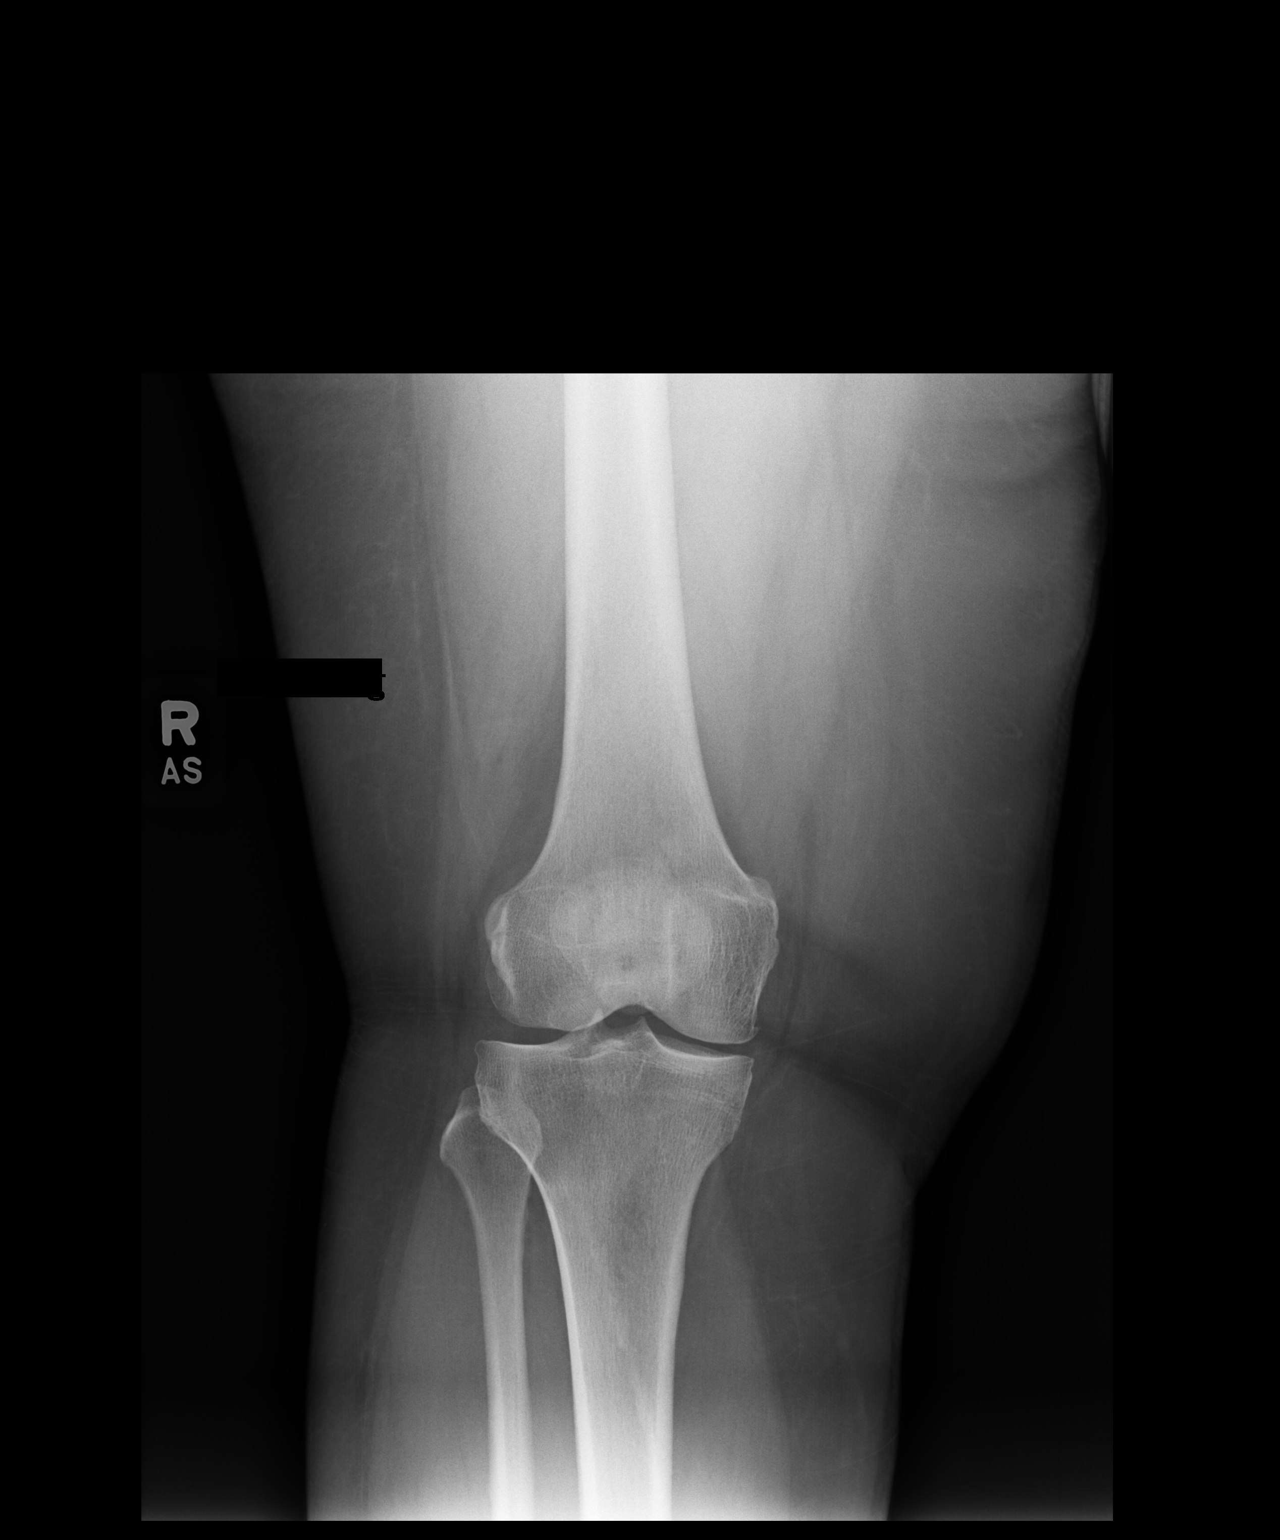

[dg knee 3 views right (2 of 3)]
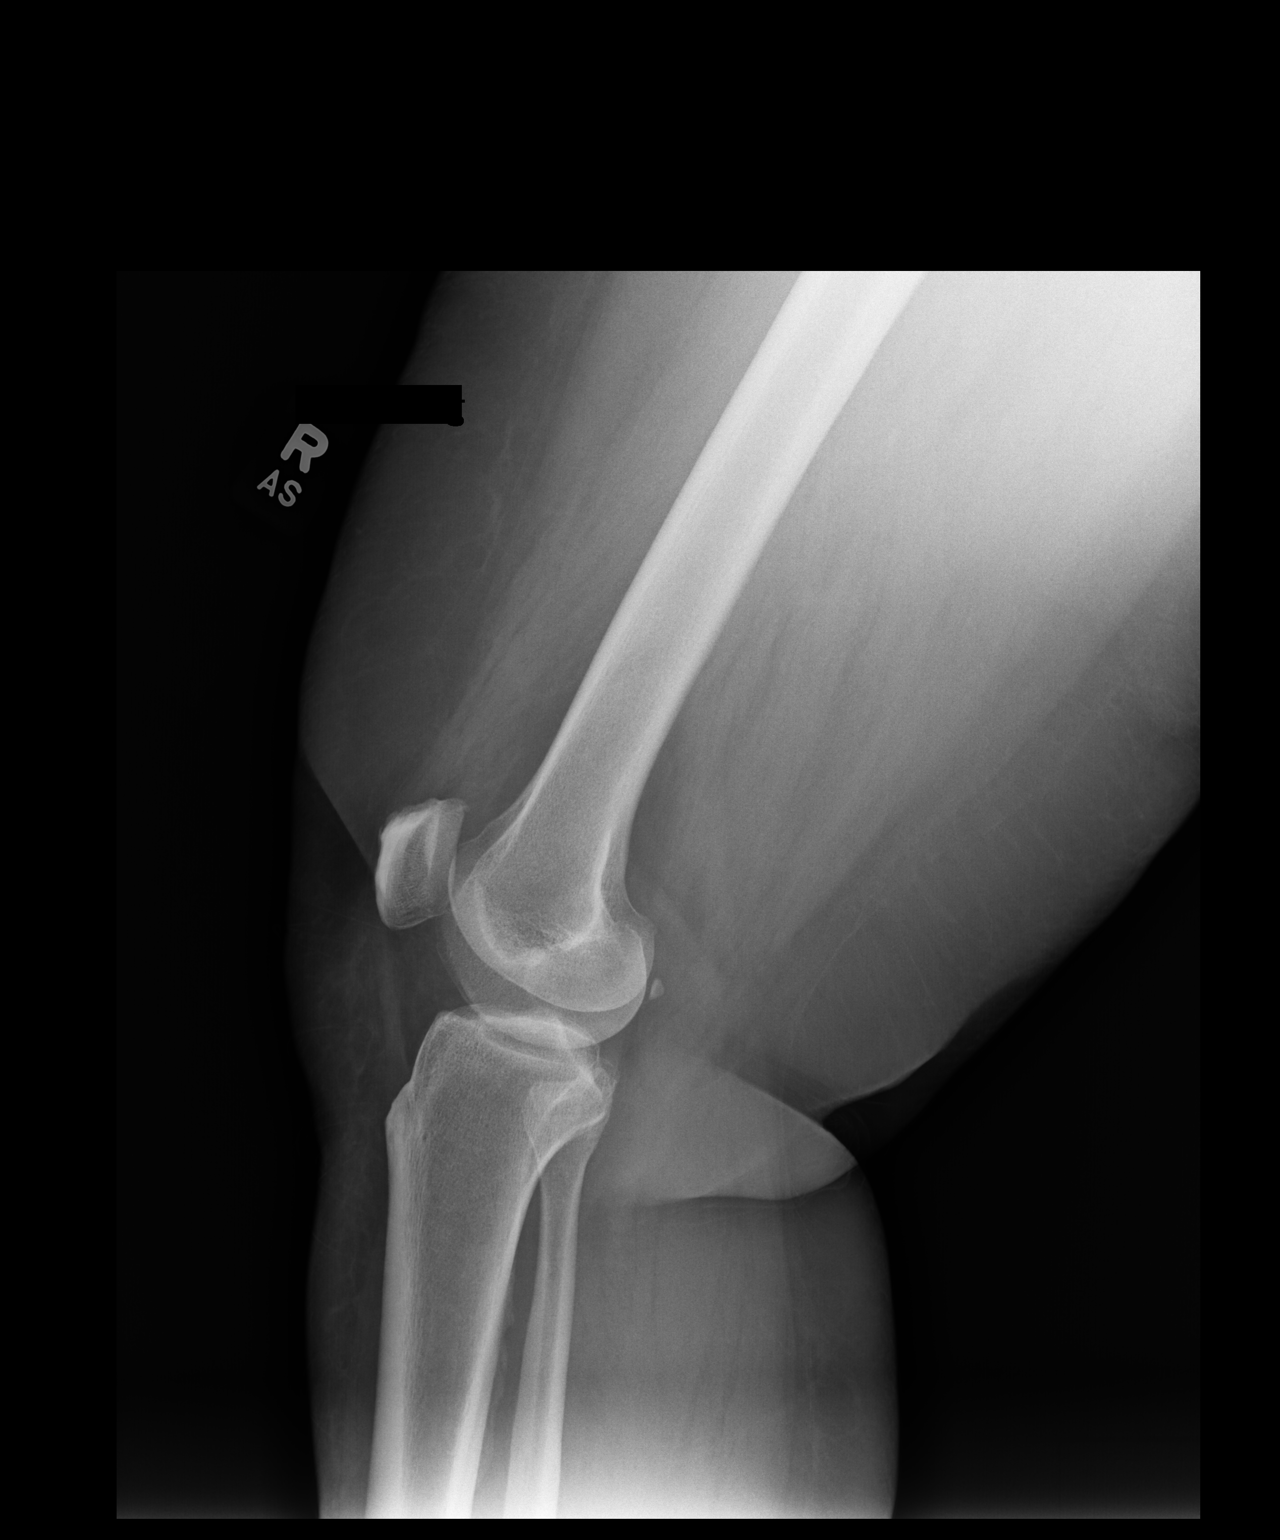

[dg knee 3 views right (3 of 3)]
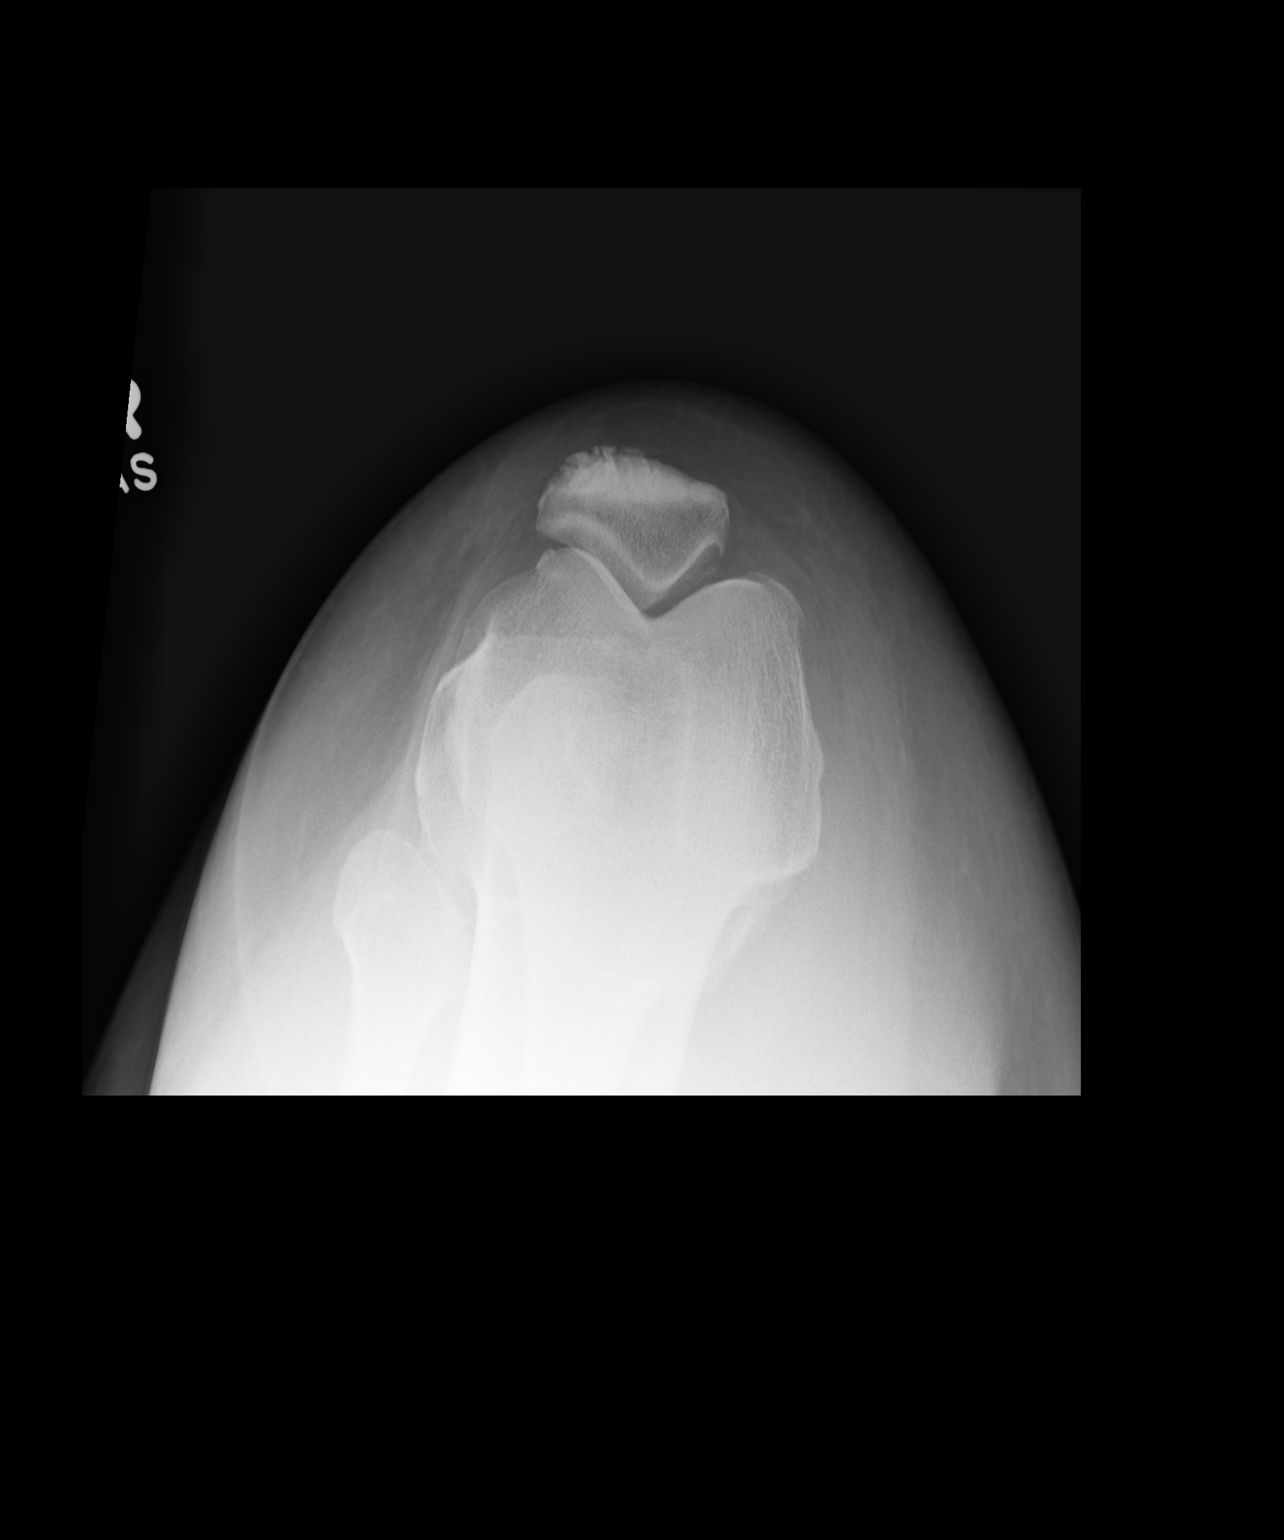

[3 of 3 positions shown; findings below may reference images not displayed]

FINDINGS: No acute fracture or dislocation. There are mild tricompartmental
degenerative changes most pronounced in the medial compartment. No
area of erosion or osseous destruction. No unexpected radiopaque
foreign body. Soft tissues are unremarkable.
IMPRESSION: Mild tricompartmental degenerative changes.

## 2022-04-25 IMAGING — DX DG KNEE 3 VIEWS*L*
3 series · 3 of 3 positions shown · non-contrast
Comparison: None.

CLINICAL DATA: Pain in both knees, unspecified chronicity

EXAM:
LEFT KNEE - 3 VIEW

[dg knee 3 views left (1 of 3)]
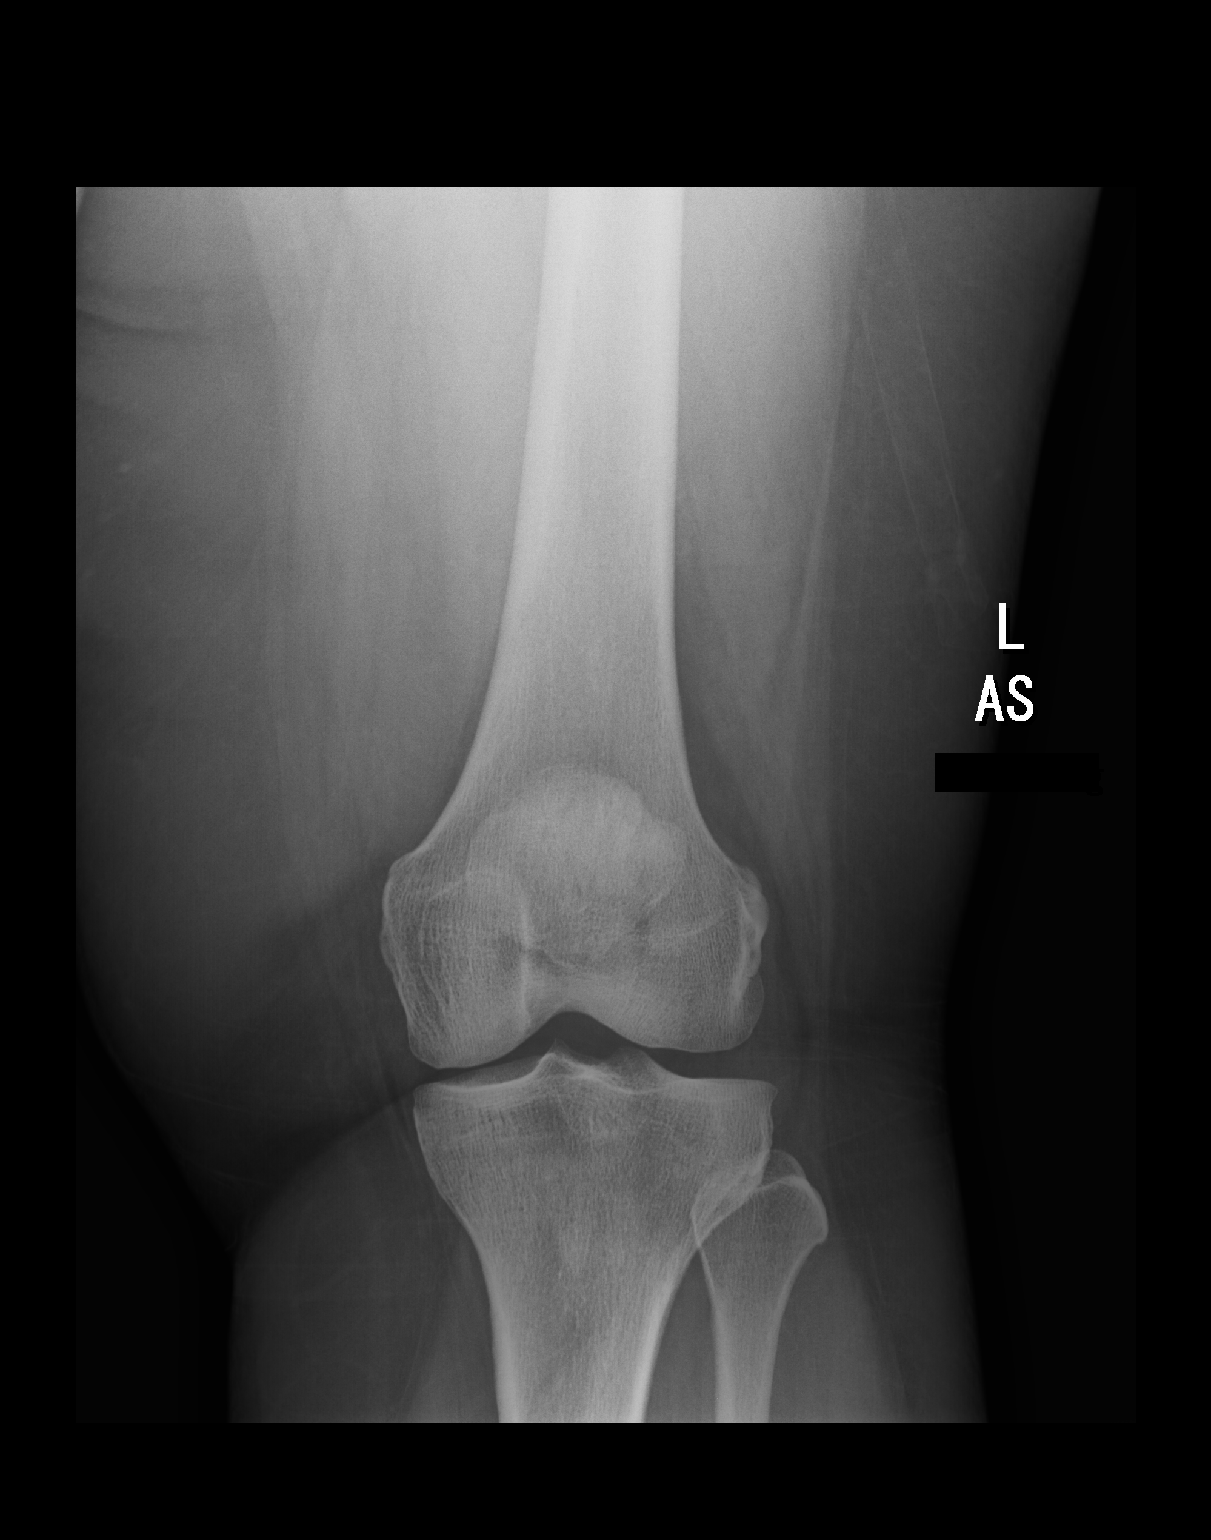

[dg knee 3 views left (2 of 3)]
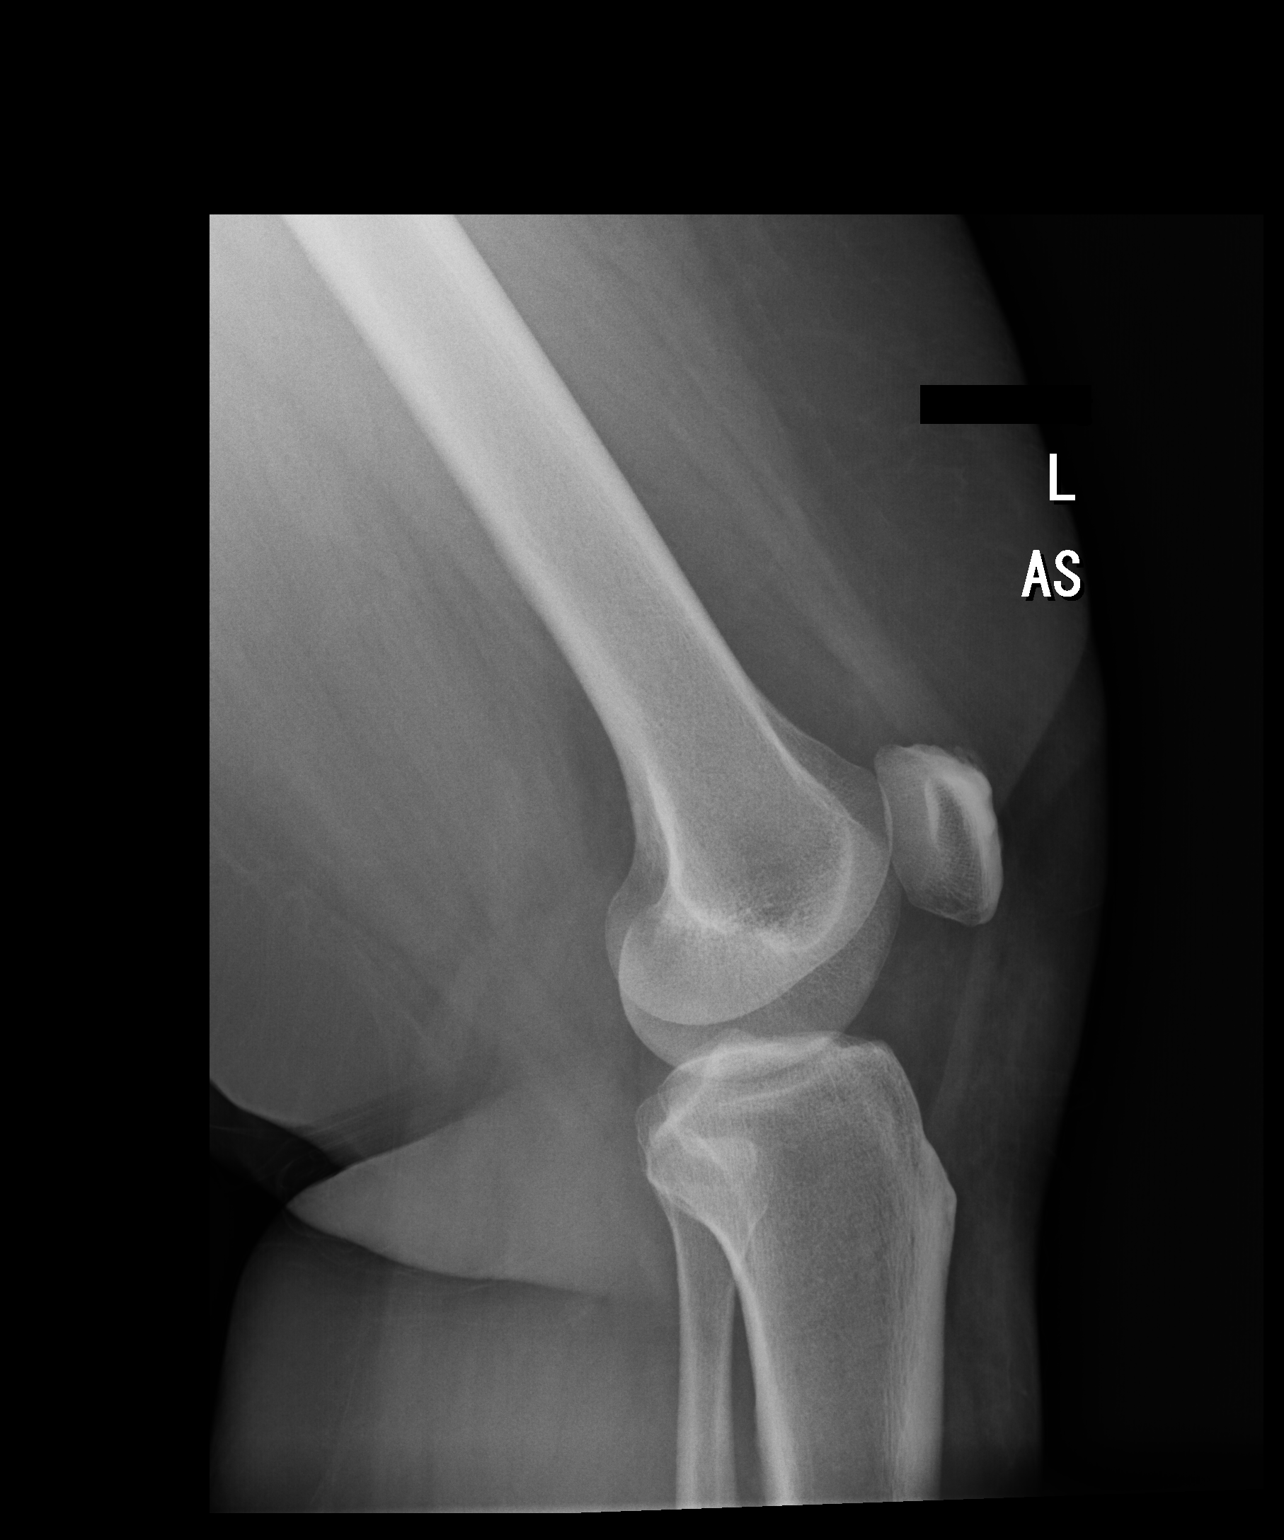

[dg knee 3 views left (3 of 3)]
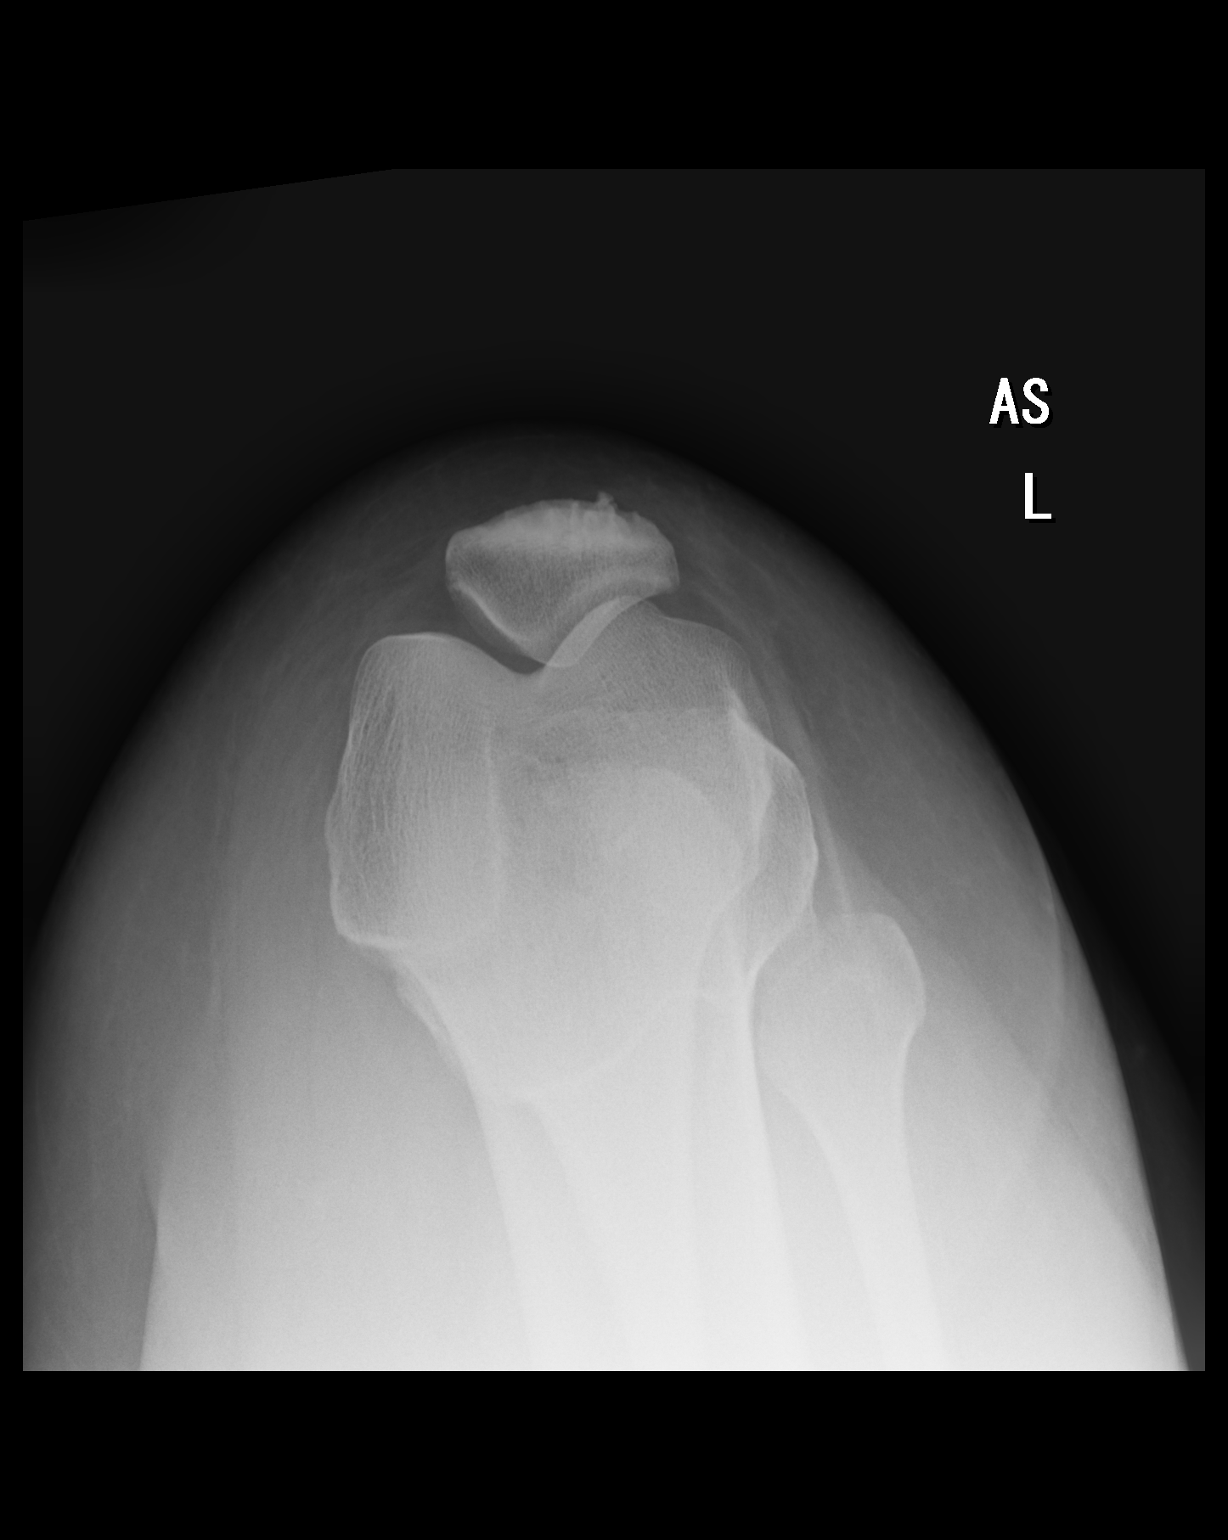

[3 of 3 positions shown; findings below may reference images not displayed]

FINDINGS: No acute fracture or dislocation. Joint spaces and alignment are
maintained. Mild enthesopathic changes of the quadriceps tendon
insertion. No area of erosion or osseous destruction. No unexpected
radiopaque foreign body. Soft tissues are unremarkable.
IMPRESSION: No acute osseous abnormality.

## 2022-04-26 ENCOUNTER — Telehealth (HOSPITAL_BASED_OUTPATIENT_CLINIC_OR_DEPARTMENT_OTHER): Payer: BC Managed Care – PPO | Admitting: Psychiatry

## 2022-04-26 ENCOUNTER — Encounter (HOSPITAL_COMMUNITY): Payer: Self-pay | Admitting: Psychiatry

## 2022-04-26 DIAGNOSIS — F331 Major depressive disorder, recurrent, moderate: Secondary | ICD-10-CM | POA: Diagnosis not present

## 2022-04-26 DIAGNOSIS — F4310 Post-traumatic stress disorder, unspecified: Secondary | ICD-10-CM

## 2022-04-26 MED ORDER — TRAZODONE HCL 100 MG PO TABS
100.0000 mg | ORAL_TABLET | Freq: Every day | ORAL | 0 refills | Status: DC
Start: 2022-04-26 — End: 2022-08-02

## 2022-04-26 MED ORDER — CITALOPRAM HYDROBROMIDE 20 MG PO TABS: 20.0000 mg | ORAL_TABLET | Freq: Every day | ORAL | 0 refills | Status: DC
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
## 2022-05-26 ENCOUNTER — Encounter (INDEPENDENT_AMBULATORY_CARE_PROVIDER_SITE_OTHER): Payer: Self-pay | Admitting: Internal Medicine

## 2022-05-26 ENCOUNTER — Ambulatory Visit (INDEPENDENT_AMBULATORY_CARE_PROVIDER_SITE_OTHER): Payer: BC Managed Care – PPO | Admitting: Internal Medicine

## 2022-05-26 VITALS — BP 91/60 | HR 73 | Temp 98.1°F | Ht 59.0 in | Wt 163.0 lb

## 2022-05-26 DIAGNOSIS — E559 Vitamin D deficiency, unspecified: Secondary | ICD-10-CM | POA: Diagnosis not present

## 2022-05-26 DIAGNOSIS — E7800 Pure hypercholesterolemia, unspecified: Secondary | ICD-10-CM | POA: Diagnosis not present

## 2022-05-26 DIAGNOSIS — E669 Obesity, unspecified: Secondary | ICD-10-CM | POA: Diagnosis not present

## 2022-05-26 DIAGNOSIS — R7303 Prediabetes: Secondary | ICD-10-CM | POA: Diagnosis not present

## 2022-05-26 DIAGNOSIS — Z6832 Body mass index (BMI) 32.0-32.9, adult: Secondary | ICD-10-CM

## 2022-05-26 MED ORDER — VITAMIN D3 50 MCG (2000 UT) PO CAPS
2000.0000 [IU] | ORAL_CAPSULE | Freq: Every day | ORAL | Status: AC
Start: 2022-05-26 — End: ?

## 2022-05-26 MED ORDER — QSYMIA 3.75-23 MG PO CP24
ORAL_CAPSULE | ORAL | 0 refills | Status: DC
Start: 2022-05-26 — End: 2022-06-13

## 2022-05-26 NOTE — Assessment & Plan Note (Signed)
LDL is not at goal but has improved his 120 down from 150. Elevated LDL may be secondary to nutrition, genetics and spillover effect from excess adiposity. Recommended LDL goal is <70 to reduce the risk of fatty streaks and the progression to obstructive ASCVD in the future. Her 10 year risk is: The 10-year ASCVD risk score (Arnett DK, et al., 2019) is: 1%  Lab Results  Component Value Date   CHOL 184 04/21/2022   HDL 51 04/21/2022   LDLCALC 120 (H) 04/21/2022   TRIG 70 04/21/2022    Continue weight loss therapy. Also advised to reduce saturated fats in diet to less than 10% of daily calories.  She does not benefit from statin therapy based on cardiovascular risk.

## 2022-05-26 NOTE — Progress Notes (Signed)
Office: [phone]  /  Fax: [phone]  WEIGHT SUMMARY AND BIOMETRICS  Vitals Temp: 98.1 F (36.7 C) BP: 91/60 Pulse Rate: 73 SpO2: 99 %   Anthropometric Measurements Height: 4\' 11"  (1.499 m) Weight: 163 lb (73.9 kg) BMI (Calculated): 32.9 Weight at Last Visit: 167 lb Weight Lost Since Last Visit: 4 lb Weight Gained Since Last Visit: 0 Starting Weight: 214 lb Total Weight Loss (lbs): 51 lb (23.1 kg) Peak Weight: 225 lb   Body Composition  Body Fat %: 4.03 % Fat Mass (lbs): 65.6 lbs Muscle Mass (lbs): 92.4 lbs Total Body Water (lbs): 66.8 lbs Visceral Fat Rating : 10    No data recorded Today's Visit #: 18  Starting Date: 03/05/21   HPI  Chief Complaint: OBESITY  Kathleen Odonnell is here to discuss her progress with her obesity treatment plan. She is on the practicing portion control and making smarter food choices, such as increasing vegetables and decreasing simple carbohydrates and states she is following her eating plan approximately 75 % of the time. She states she is exercising walking 5,000 steps daily.   Interval History:  Since last office visit she has [x]  lost []  maintained [] gained weight.  Adherence to nutrition plan :  []  Excellent [x]  Good []  Fair []  Suboptimal []  Variable []  Gradual implementation [] Has not started implementation  Nutritional: She has been working on not skipping meals, increasing protein intake at every meal, eating more fruits, eating more vegetables, drinking more water, making healthier choices, and continues to exercise  Orexigenic Control: [x] Denies [] Reports problems with appetite and hunger signals.  [x] Denies [] Reports problems with satiety and satiation.  [x] Denies [] Reports problems with eating patterns and portion control.  [x] Denies [] Reports strong cravings for highly palatable foods [x] Denies [] Reports problems with feeling restricted or deprived  Stress levels: []  Low [x] Medium [] High   Barriers identified:  strong hunger signals and appetite and work schedule.   Pharmacotherapy for weight loss: She is currently taking Wegovy 2.4 mg a week   ASSESSMENT AND PLAN  TREATMENT PLAN FOR OBESITY:  Recommended Dietary Goals  Kathleen Odonnell is currently in the action stage of change. As such, her goal is to continue weight management plan. She has agreed to: continue current plan  Behavioral Intervention  We discussed the following Behavioral Modification Strategies today: increasing lean protein intake, decreasing simple carbohydrates , increasing vegetables, increasing lower glycemic fruits, increasing fiber rich foods, increasing water intake, work on managing stress, creating time for self-care and relaxation measures, avoiding temptations and identifying enticing environmental cues, and planning for success.  Additional resources provided today: Handout on FDA approved anti-obesity medications and adverse effects  Recommended Physical Activity Goals  Kathleen Odonnell has been advised to work up to 150 minutes of moderate intensity aerobic activity a week and strengthening exercises 2-3 times per week for cardiovascular health, weight loss maintenance and preservation of muscle mass.   She has agreed to :  Continue current level of physical activity   Pharmacotherapy We have discussed various medication options to help Kathleen Odonnell with her weight loss efforts and we both agreed to :  Switch to Qsymia.  She no longer has coverage for [facility] as she was at the maximum dose so anticipate she will experience significant rebound hunger signals and impaired satiety this will make it very difficult for her to maintain her weight as has been described in the medical literature.  In addition to ensuring adequate intake of protein and fibrous foods to improve satiety she will be started  on Qsymia.  We reviewed state registry, she is at low risk for pregnancy, we discussed benefits and side effects of medication and she also signed  agreement.  She will take 3.75 mg / 23 mg 1 tablet daily for 2 weeks and then increase to 7.5 mg / 46 mg x 1 week.  She will be reevaluated for effectiveness and adjustment in about 3 weeks.  ASSOCIATED CONDITIONS ADDRESSED TODAY  Prediabetes Assessment & Plan: Most recent A1c is  Lab Results  Component Value Date   HGBA1C 5.2 04/21/2022  and improved.  Patient informed of disease state and risk of progression. This may contribute to abnormal cravings, fatigue and diabetes complications without having diabetes.   Continue with nutritional and behavioral strategies.  She will be transitioning from GLP-1 to metformin in the near future.    Obesity: Current BMI 35.1 -     Qsymia; Take 1 capsule by mouth daily for 14 days, THEN 2 capsules daily for 7 days.  Dispense: 30 capsule; Refill: 0  Vitamin D deficiency Assessment & Plan: Most recent vitamin D levels  Lab Results  Component Value Date   VD25OH 47.5 04/21/2022   VD25OH 43.6 03/04/2021     Vitamin D deficiency state has improved. Currently on vitamin D supplementation without any adverse effects.  Plan: Continue vitamin D supplementation.   Orders: -     Vitamin D3; Take 1 capsule (2,000 Units total) by mouth daily.  Pure hypercholesterolemia Assessment & Plan: LDL is not at goal but has improved his 120 down from 150. Elevated LDL may be secondary to nutrition, genetics and spillover effect from excess adiposity. Recommended LDL goal is <70 to reduce the risk of fatty streaks and the progression to obstructive ASCVD in the future. Her 10 year risk is: The 10-year ASCVD risk score (Arnett DK, et al., 2019) is: 1%  Lab Results  Component Value Date   CHOL 184 04/21/2022   HDL 51 04/21/2022   LDLCALC 120 (H) 04/21/2022   TRIG 70 04/21/2022    Continue weight loss therapy. Also advised to reduce saturated fats in diet to less than 10% of daily calories.  She does not benefit from statin therapy based on cardiovascular  risk.      PHYSICAL EXAM:  Blood pressure 91/60, pulse 73, temperature 98.1 F (36.7 C), height 4\' 11"  (1.499 m), weight 163 lb (73.9 kg), SpO2 99 %. Body mass index is 32.92 kg/m.  General: She is overweight, cooperative, alert, well developed, and in no acute distress. PSYCH: Has normal mood, affect and thought process.   HEENT: EOMI, sclerae are anicteric. Lungs: Normal breathing effort, no conversational dyspnea. Extremities: No edema.  Neurologic: No gross sensory or motor deficits. No tremors or fasciculations noted.    DIAGNOSTIC DATA REVIEWED:  BMET    Component Value Date/Time   NA 142 04/21/2022 0917   K 4.0 04/21/2022 0917   CL 103 04/21/2022 0917   CO2 21 04/21/2022 0917   GLUCOSE 79 04/21/2022 0917   BUN 10 04/21/2022 0917   CREATININE 0.71 04/21/2022 0917   CALCIUM 8.6 (L) 04/21/2022 0917   EGFR 102 04/21/2022 0917   Lab Results  Component Value Date   HGBA1C 5.2 04/21/2022   HGBA1C 5.8 (H) 03/04/2021   Lab Results  Component Value Date   INSULIN 4.6 04/21/2022   INSULIN 11.4 03/04/2021   Lab Results  Component Value Date   TSH 1.860 03/04/2021   CBC    Component Value Date/Time  WBC 8.5 11/08/2021 0803   WBC 12.6 (H) 06/28/2007 0550   RBC 4.40 11/08/2021 0803   RBC 3.09 (L) 06/28/2007 0550   HGB 12.1 11/08/2021 0803   HCT 36.3 11/08/2021 0803   PLT 201 06/28/2007 0550   MCV 83 11/08/2021 0803   MCH 27.5 11/08/2021 0803   MCHC 33.3 11/08/2021 0803   MCHC 33.8 06/28/2007 0550   RDW 15.6 (H) 11/08/2021 0803   Iron Studies    Component Value Date/Time   IRON 59 11/08/2021 0803   TIBC 294 11/08/2021 0803   FERRITIN 78 11/08/2021 0803   IRONPCTSAT 20 11/08/2021 0803   Lipid Panel     Component Value Date/Time   CHOL 184 04/21/2022 0917   TRIG 70 04/21/2022 0917   HDL 51 04/21/2022 0917   LDLCALC 120 (H) 04/21/2022 0917   Hepatic Function Panel     Component Value Date/Time   PROT 7.1 04/21/2022 0917   ALBUMIN 3.9  04/21/2022 0917   AST 15 04/21/2022 0917   ALT 8 04/21/2022 0917   ALKPHOS 80 04/21/2022 0917   BILITOT <0.2 04/21/2022 0917      Component Value Date/Time   TSH 1.860 03/04/2021 0953   Nutritional Lab Results  Component Value Date   VD25OH 47.5 04/21/2022   VD25OH 43.6 03/04/2021     Return in about 18 days (around 06/13/2022) for For Weight Mangement with Dr. Rikki Spearing.[name] She was informed of the importance of frequent follow up visits to maximize her success with intensive lifestyle modifications for her multiple health conditions.   ATTESTASTION STATEMENTS:  Reviewed by clinician on day of visit: allergies, medications, problem list, medical history, surgical history, family history, social history, and previous encounter notes.   I have spent 40 minutes in the care of the patient today including: obtaining and/or reviewing separately obtained history, performing a medically appropriate examination or evaluation, counseling and educating the patient, ordering medications, test and procedures, documenting clinical information in the electronic or other health care record, and verifying state registry and reviewing medication agreement.   Worthy Rancher, MD

## 2022-05-26 NOTE — Assessment & Plan Note (Signed)
Most recent vitamin D levels  Lab Results  Component Value Date   VD25OH 47.5 04/21/2022   VD25OH 43.6 03/04/2021     Vitamin D deficiency state has improved. Currently on vitamin D supplementation without any adverse effects.  Plan: Continue vitamin D supplementation.

## 2022-05-26 NOTE — Assessment & Plan Note (Signed)
Most recent A1c is  Lab Results  Component Value Date   HGBA1C 5.2 04/21/2022  and improved.  Patient informed of disease state and risk of progression. This may contribute to abnormal cravings, fatigue and diabetes complications without having diabetes.   Continue with nutritional and behavioral strategies.  She will be transitioning from GLP-1 to metformin in the near future.

## 2022-05-27 ENCOUNTER — Encounter (INDEPENDENT_AMBULATORY_CARE_PROVIDER_SITE_OTHER): Payer: Self-pay | Admitting: Internal Medicine

## 2022-05-31 NOTE — Telephone Encounter (Signed)
Medications that do not require a PA.  Contrave Not Required phentermine Not Required Saxenda Not Required Topiramate Not Required [facility] Not Required  PA submitted today via cover my meds.    Your information has been submitted to [employer]. To check for an updated outcome later, reopen this PA request from your dashboard.  If [employer] has not responded to your request within 24 hours, contact [employer] at [phone]. If you think there may be a problem with your PA request, use our live chat feature at the bottom right.

## 2022-06-02 NOTE — Telephone Encounter (Signed)
Call to [facility] [employer].  Prescription was denied via cover my meds.  Spoke with customer support service, prescription was approved from 05/31/2022 until 08/29/2022.  Approval # [id number]. Patient has been notified via my chart.

## 2022-06-13 ENCOUNTER — Encounter (INDEPENDENT_AMBULATORY_CARE_PROVIDER_SITE_OTHER): Payer: Self-pay | Admitting: Physician Assistant

## 2022-06-13 ENCOUNTER — Ambulatory Visit (INDEPENDENT_AMBULATORY_CARE_PROVIDER_SITE_OTHER): Payer: BC Managed Care – PPO | Admitting: Physician Assistant

## 2022-06-13 VITALS — BP 100/66 | HR 75 | Temp 97.7°F | Ht 59.0 in | Wt 166.0 lb

## 2022-06-13 DIAGNOSIS — K5900 Constipation, unspecified: Secondary | ICD-10-CM

## 2022-06-13 DIAGNOSIS — E559 Vitamin D deficiency, unspecified: Secondary | ICD-10-CM

## 2022-06-13 DIAGNOSIS — R7303 Prediabetes: Secondary | ICD-10-CM

## 2022-06-13 DIAGNOSIS — Z6833 Body mass index (BMI) 33.0-33.9, adult: Secondary | ICD-10-CM

## 2022-06-13 MED ORDER — PHENTERMINE-TOPIRAMATE 7.5-46 MG PO CP24
1.0000 | ORAL_CAPSULE | Freq: Two times a day (BID) | ORAL | 0 refills | Status: DC
Start: 2022-06-13 — End: 2022-07-04

## 2022-06-13 NOTE — Progress Notes (Signed)
.smr  Office: [phone]  /  Fax: [phone]  WEIGHT SUMMARY AND BIOMETRICS  Vitals Temp: 97.7 F (36.5 C) BP: 100/66 Pulse Rate: 75 SpO2: 98 %   Anthropometric Measurements Height: 4\' 11"  (1.499 m) Weight: 166 lb (75.3 kg) BMI (Calculated): 33.51 Weight at Last Visit: 163 lb Weight Lost Since Last Visit: 0 lb Weight Gained Since Last Visit: 3 lb Starting Weight: 214 lb   Body Composition  Body Fat %: 43.3 % Fat Mass (lbs): 72.2 lbs Muscle Mass (lbs): 89.6 lbs Total Body Water (lbs): 70.8 lbs Visceral Fat Rating : 11   Other Clinical Data Fasting: no Labs: No Today's Visit #: 19 Starting Date: 03/05/21     HPI  Chief Complaint: OBESITY  Kathleen Odonnell is here to discuss her progress with her obesity treatment plan. She is on the practicing portion control and making smarter food choices, such as increasing vegetables and decreasing simple carbohydrates and states she is following her eating plan approximately 70 % of the time. She states she is exercising 0 minutes 0 times per week.   Interval History:  Since last office visit she is up 3 lbs. / Total weight loss 48 lbs.  She has been off GLP 1 medications for several weeks.  Hunger/appetite-increased hunger off GLP-1, but feels may be getting better as increased Qysmia over past couple of weeks.  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   Has cruise to [place] and [place] coming up leaves this Sat. X 7 days.  We discussed travel strategies.   Pharmacotherapy: Qysmia 7.5-46 mg twice daily. Reports dry mouth, constipation and sleep has been poor with Qysmia.   Previously on Wegovy/Ozempic until few weeks ago when insurance would no longer cover and could not afford medication any longer.   TREATMENT PLAN FOR OBESITY: She would like to continue trial of Qysmia at this point. She does feel that it is starting to help some with appetite and cravings. We did discuss considering starting metformin as well and can discuss further at follow up .  I have consulted the [place] Controlled Substances Registry for this patient, and feel the risk/benefit ratio today is favorable for proceeding with this prescription for a controlled substance. No aberrancies noted.   Recommended Dietary Goals  Kathleen Odonnell is currently in the action stage of change. As such, her goal is to continue weight management plan. She has agreed to practicing portion control and making smarter food choices, such as increasing vegetables and decreasing simple carbohydrates.  Behavioral Intervention  We discussed the following Behavioral Modification Strategies today: increasing lean protein intake, decreasing simple carbohydrates , increasing vegetables, increasing lower glycemic fruits, increasing fiber rich foods, avoiding skipping meals, increasing water intake, continue to practice mindfulness when eating, planning for success, and staying on track while traveling and vacationing.  Additional resources provided today: NA  Recommended Physical Activity Goals  Kathleen Odonnell has been advised to work up to 150 minutes of moderate intensity aerobic activity a week and strengthening exercises 2-3 times per week for cardiovascular health, weight loss maintenance and preservation of muscle mass.   She has agreed to Continue current level of physical activity , Start  strengthening exercises with a goal of 2-3 sessions a week , and Start aerobic activity with a goal of 150 minutes a week at moderate intensity.    Pharmacotherapy We discussed various medication options to help Kathleen Odonnell with her weight loss  efforts and we both agreed to continue Qysmia 7.5-46 mg twice daily for weight loss.    Return in about 4 weeks (around 07/11/2022).[name] She was informed of the importance of frequent follow up visits to maximize her success with intensive lifestyle modifications for her multiple health conditions.  PHYSICAL EXAM:  Blood pressure 100/66, pulse 75, temperature 97.7 F (36.5 C), height 4\' 11"  (1.499 m), weight 166 lb (75.3 kg), SpO2 98 %. Body mass index is 33.53 kg/m.  General: She is overweight, cooperative, alert, well developed, and in no acute distress. PSYCH: Has normal mood, affect and thought process.   Cardiovascular: HR 70's regular Lungs: Normal breathing effort, no conversational dyspnea. Neuro: no focal deficit  DIAGNOSTIC DATA REVIEWED:  BMET    Component Value Date/Time   NA 142 04/21/2022 0917   K 4.0 04/21/2022 0917   CL 103 04/21/2022 0917   CO2 21 04/21/2022 0917   GLUCOSE 79 04/21/2022 0917   BUN 10 04/21/2022 0917   CREATININE 0.71 04/21/2022 0917   CALCIUM 8.6 (L) 04/21/2022 0917   Lab Results  Component Value Date   HGBA1C 5.2 04/21/2022   HGBA1C 5.8 (H) 03/04/2021   Lab Results  Component Value Date   INSULIN 4.6 04/21/2022   INSULIN 11.4 03/04/2021   Lab Results  Component Value Date   TSH 1.860 03/04/2021   CBC    Component Value Date/Time   WBC 8.5 11/08/2021 0803   WBC 12.6 (H) 06/28/2007 0550   RBC 4.40 11/08/2021 0803   RBC 3.09 (L) 06/28/2007 0550   HGB 12.1 11/08/2021 0803   HCT 36.3 11/08/2021 0803   PLT 201 06/28/2007 0550   MCV 83 11/08/2021 0803   MCH 27.5 11/08/2021 0803   MCHC 33.3 11/08/2021 0803   MCHC 33.8 06/28/2007 0550   RDW 15.6 (H) 11/08/2021 0803   Iron Studies    Component Value Date/Time   IRON 59 11/08/2021 0803   TIBC 294 11/08/2021 0803   FERRITIN 78 11/08/2021 0803   IRONPCTSAT 20 11/08/2021 0803   Lipid Panel     Component Value Date/Time   CHOL 184 04/21/2022 0917   TRIG 70 04/21/2022 0917   HDL  51 04/21/2022 0917   LDLCALC 120 (H) 04/21/2022 0917   Hepatic Function Panel     Component Value Date/Time   PROT 7.1 04/21/2022 0917   ALBUMIN 3.9 04/21/2022 0917   AST 15 04/21/2022 0917   ALT 8 04/21/2022 0917   ALKPHOS 80 04/21/2022 0917   BILITOT <0.2 04/21/2022 0917      Component Value Date/Time   TSH 1.860 03/04/2021 0953   Nutritional Lab Results  Component Value Date   VD25OH 47.5 04/21/2022   VD25OH 43.6 03/04/2021    ASSOCIATED CONDITIONS ADDRESSED TODAY  ASSESSMENT AND PLAN  Problem List Items Addressed This Visit     Prediabetes - Primary   Class 3 severe obesity with serious comorbidity and body mass index (BMI) of 40.0 to 44.9 in adult [facility])   Relevant Medications   Phentermine-Topiramate 7.5-46 MG CP24   Obesity Start BMI 43.22   Relevant Medications   Phentermine-Topiramate 7.5-46 MG CP24   Vitamin D deficiency   BMI 33.0-33.9,adult Current BMI 33.6   Other Visit Diagnoses  Constipation, unspecified constipation type          Prediabetes Last A1c was 5.2/ Insulin 4.6- at goals  Medication(s):  Qysmia 7.5-46 mg twice daily. Some constipation, dry mouth. Some sleep disruption, but some difficulty with sleep prior to starting medication.  Polyphagia:Yes She is working on nutrition plan to decrease simple carbohydrates, increase lean proteins and exercise to promote weight loss, improve glycemic control and prevent progression to Type 2 diabetes.   Lab Results  Component Value Date   HGBA1C 5.2 04/21/2022   HGBA1C 5.3 06/28/2021   HGBA1C 5.8 (H) 03/04/2021   Lab Results  Component Value Date   INSULIN 4.6 04/21/2022   INSULIN 11.1 06/28/2021   INSULIN 11.4 03/04/2021    Plan: Continue and refill qysmia 7.5-46 mg twice daily.  Continue working on nutrition plan to decrease simple carbohydrates, increase lean proteins and exercise to promote weight loss, improve glycemic control and prevent progression to Type 2 diabetes.     Vitamin D Deficiency Vitamin D is not at goal of 50.  Most recent vitamin D level was 47.5- stable~ at  goal. She is on OTC vitamin D3 2000 IU daily.No N/V or muscle weakness with OTC vitamin D.  Lab Results  Component Value Date   VD25OH 47.5 04/21/2022   VD25OH 43.6 03/04/2021    Plan: Continue OTC vitamin D3 2000 IU daily Low vitamin D levels can be associated with adiposity and may result in leptin resistance and weight gain. Also associated with fatigue. Currently on vitamin D supplementation without any adverse effects.   Constipation Kathleen Odonnell notes constipation.   This is likely related medications. She has been taking Senna tabs. Constipation is poorly controlled.   Plan: Increase fiber and water. Add psyllium capsules 2-4 capsules daily with plenty of water.  Add MiraLAX once daily if continues to be problematic.     ATTESTASTION STATEMENTS:  Reviewed by clinician on day of visit: allergies, medications, problem list, medical history, surgical history, family history, social history, and previous encounter notes.   I have personally spent 44 minutes total time today in preparation, patient care, nutritional counseling and documentation for this visit, including the following: review of clinical lab tests; review of medical tests/procedures/services.      Kathleen Serrao, PA-C

## 2022-06-29 ENCOUNTER — Encounter (INDEPENDENT_AMBULATORY_CARE_PROVIDER_SITE_OTHER): Payer: Self-pay | Admitting: Internal Medicine

## 2022-06-29 ENCOUNTER — Other Ambulatory Visit (INDEPENDENT_AMBULATORY_CARE_PROVIDER_SITE_OTHER): Payer: Self-pay | Admitting: Internal Medicine

## 2022-07-04 ENCOUNTER — Ambulatory Visit (INDEPENDENT_AMBULATORY_CARE_PROVIDER_SITE_OTHER): Payer: BC Managed Care – PPO | Admitting: Internal Medicine

## 2022-07-04 ENCOUNTER — Encounter (INDEPENDENT_AMBULATORY_CARE_PROVIDER_SITE_OTHER): Payer: Self-pay | Admitting: Internal Medicine

## 2022-07-04 VITALS — BP 104/70 | HR 71 | Temp 98.1°F | Ht 59.0 in | Wt 170.0 lb

## 2022-07-04 DIAGNOSIS — E669 Obesity, unspecified: Secondary | ICD-10-CM

## 2022-07-04 DIAGNOSIS — R7303 Prediabetes: Secondary | ICD-10-CM | POA: Diagnosis not present

## 2022-07-04 DIAGNOSIS — R632 Polyphagia: Secondary | ICD-10-CM

## 2022-07-04 DIAGNOSIS — Z6834 Body mass index (BMI) 34.0-34.9, adult: Secondary | ICD-10-CM

## 2022-07-04 DIAGNOSIS — E559 Vitamin D deficiency, unspecified: Secondary | ICD-10-CM

## 2022-07-04 MED ORDER — PHENTERMINE-TOPIRAMATE ER 7.5-46 MG PO CP24: 1.0000 | ORAL_CAPSULE | Freq: Every day | ORAL | 0 refills | Status: DC

## 2022-07-04 NOTE — Assessment & Plan Note (Signed)
Patient has been off Wegovy now for 4 weeks and off Qsymia for 2 weeks she was on vacation and had run out of medication.  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  She will continue Qsymia 7.5 mg once a day for 12 weeks.

## 2022-07-04 NOTE — Progress Notes (Signed)
Office: [phone]  /  Fax: [phone]  WEIGHT SUMMARY AND BIOMETRICS  Vitals Temp: 98.1 F (36.7 C) BP: 104/70 Pulse Rate: 71 SpO2: 98 %   Anthropometric Measurements Height: 4\' 11"  (1.499 m) Weight: 170 lb (77.1 kg) BMI (Calculated): 34.32 Weight at Last Visit: 166lb Weight Lost Since Last Visit: 0 Weight Gained Since Last Visit: 4lb Starting Weight: 214lb Total Weight Loss (lbs): 47 lb (21.3 kg) Peak Weight: 225lb   Body Composition  Body Fat %: 43.1 % Fat Mass (lbs): 73.4 lbs Muscle Mass (lbs): 91.8 lbs Total Body Water (lbs): 69.6 lbs Visceral Fat Rating : 11    No data recorded Today's Visit #: 20  Starting Date: 03/05/21   HPI  Chief Complaint: OBESITY  Kathleen Odonnell is here to discuss her progress with her obesity treatment plan. She is on the practicing portion control and making smarter food choices, such as increasing vegetables and decreasing simple carbohydrates and states she is following her eating plan approximately 60 % of the time. She states she is exercising walking 25 minutes 3 times per week.  Interval History:  Since last office visit she has gained 4 lbs. Was on a cruise getting back. She has been working on increasing protein intake at every meal, eating more vegetables, drinking more water, begun to exercise, reducing portion sizes, and getting back on track following recent lapse Off AOM for 2 weeks. Wegovy no longer covered started Qsymia but only starter bottle.  Orixegenic Control: Reports problems with appetite and hunger signals.  Denies problems with satiety and satiation.  Denies problems with eating patterns and portion control.  Reports abnormal cravings for sugar. Denies feeling deprived or restricted.   Barriers identified: strong hunger signals and appetite and cost of medication.   Pharmacotherapy for weight loss: She is currently taking Qsymia with adequate clinical response  and without side effects..     ASSESSMENT AND PLAN  TREATMENT PLAN FOR OBESITY:  Recommended Dietary Goals  [name] is currently in the action stage of change. As such, her goal is to continue weight management plan. She has agreed to: continue current plan  Behavioral Intervention  We discussed the following Behavioral Modification Strategies today: increasing lean protein intake, decreasing simple carbohydrates , increasing vegetables, increasing lower glycemic fruits, increasing fiber rich foods, avoiding skipping meals, increasing water intake, and planning for success.  Additional resources provided today: None  Recommended Physical Activity Goals  [name] has been advised to work up to 150 minutes of moderate intensity aerobic activity a week and strengthening exercises 2-3 times per week for cardiovascular health, weight loss maintenance and preservation of muscle mass.   She has agreed to :  Resume physical activity  Pharmacotherapy We discussed various medication options to help [name] with her weight loss efforts and we both agreed to :  Corrected dose prescribed by PA. Twice a day dosing never filled. Will take Qsymia 7.5 once a day for 12 weeks.  ASSOCIATED CONDITIONS ADDRESSED TODAY  Prediabetes Assessment & Plan: Most recent A1c is  Lab Results  Component Value Date   HGBA1C 5.2 04/21/2022  and improved.  Patient informed of disease state and risk of progression. This may contribute to abnormal cravings, fatigue and diabetes complications without having diabetes.   Continue with nutritional and behavioral strategies.  Currently transitioning from GLP-1 to Qsymia due to cost.  We may consider adding metformin down the line.    Vitamin D deficiency  Obesity: Current BMI 35.1 -  Phentermine-Topiramate; Take 1 capsule by mouth daily.  Dispense: 30 capsule; Refill: 0  Polyphagia Assessment & Plan: Patient has been off Wegovy now for 4 weeks and off Qsymia for 2 weeks she was on vacation  and had run out of medication.  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  She will continue Qsymia 7.5 mg once a day for 12 weeks.      PHYSICAL EXAM:  Blood pressure 104/70, pulse 71, temperature 98.1 F (36.7 C), height 4\' 11"  (1.499 m), weight 170 lb (77.1 kg), SpO2 98 %. Body mass index is 34.34 kg/m.  General: She is overweight, cooperative, alert, well developed, and in no acute distress. PSYCH: Has normal mood, affect and thought process.   HEENT: EOMI, sclerae are anicteric. Lungs: Normal breathing effort, no conversational dyspnea. Extremities: No edema.  Neurologic: No gross sensory or motor deficits. No tremors or fasciculations noted.    DIAGNOSTIC DATA REVIEWED:  BMET    Component Value Date/Time   NA 142 04/21/2022 0917   K 4.0 04/21/2022 0917   CL 103 04/21/2022 0917   CO2 21 04/21/2022 0917   GLUCOSE 79 04/21/2022 0917   BUN 10 04/21/2022 0917   CREATININE 0.71 04/21/2022 0917   CALCIUM 8.6 (L) 04/21/2022 0917   Lab Results  Component Value Date   HGBA1C 5.2 04/21/2022   HGBA1C 5.8 (H) 03/04/2021   Lab Results  Component Value Date   INSULIN 4.6 04/21/2022   INSULIN 11.4 03/04/2021   Lab Results  Component Value Date   TSH 1.860 03/04/2021   CBC    Component Value Date/Time   WBC 8.5 11/08/2021 0803   WBC 12.6 (H) 06/28/2007 0550   RBC 4.40 11/08/2021 0803   RBC 3.09 (L) 06/28/2007 0550   HGB 12.1 11/08/2021 0803   HCT 36.3 11/08/2021 0803   PLT 201 06/28/2007 0550   MCV 83 11/08/2021 0803   MCH 27.5 11/08/2021 0803   MCHC 33.3 11/08/2021 0803   MCHC 33.8 06/28/2007 0550   RDW 15.6 (H) 11/08/2021 0803   Iron Studies    Component Value Date/Time   IRON 59 11/08/2021 0803   TIBC 294 11/08/2021 0803   FERRITIN 78 11/08/2021 0803   IRONPCTSAT 20 11/08/2021 0803   Lipid Panel      Component Value Date/Time   CHOL 184 04/21/2022 0917   TRIG 70 04/21/2022 0917   HDL 51 04/21/2022 0917   LDLCALC 120 (H) 04/21/2022 0917   Hepatic Function Panel     Component Value Date/Time   PROT 7.1 04/21/2022 0917   ALBUMIN 3.9 04/21/2022 0917   AST 15 04/21/2022 0917   ALT 8 04/21/2022 0917   ALKPHOS 80 04/21/2022 0917   BILITOT <0.2 04/21/2022 0917      Component Value Date/Time   TSH 1.860 03/04/2021 0953   Nutritional Lab Results  Component Value Date   VD25OH 47.5 04/21/2022   VD25OH 43.6 03/04/2021     Return in about 4 weeks (around 08/01/2022) for For Weight Mangement with Dr. Rikki Spearing.[name] She was informed of the importance of frequent follow up visits to maximize her success with intensive lifestyle modifications for her multiple health conditions.   ATTESTASTION STATEMENTS:  Reviewed by clinician on day of visit: allergies, medications, problem list, medical history, surgical history, family history, social history, and previous encounter notes  I  have spent 30 minutes in the care of the patient today including: preparing to see patient (e.g. review and interpretation of tests, old notes ), counseling and educating the patient, ordering medications, test and procedures, and documenting clinical information in the electronic or other health care record   Worthy Rancher, MD

## 2022-07-04 NOTE — Assessment & Plan Note (Signed)
Most recent A1c is  Lab Results  Component Value Date   HGBA1C 5.2 04/21/2022  and improved.  Patient informed of disease state and risk of progression. This may contribute to abnormal cravings, fatigue and diabetes complications without having diabetes.   Continue with nutritional and behavioral strategies.  Currently transitioning from GLP-1 to Qsymia due to cost.  We may consider adding metformin down the line.

## 2022-07-11 ENCOUNTER — Ambulatory Visit (INDEPENDENT_AMBULATORY_CARE_PROVIDER_SITE_OTHER): Payer: BC Managed Care – PPO | Admitting: Internal Medicine

## 2022-07-26 ENCOUNTER — Telehealth (HOSPITAL_COMMUNITY): Payer: BC Managed Care – PPO | Admitting: Psychiatry

## 2022-08-01 ENCOUNTER — Ambulatory Visit (INDEPENDENT_AMBULATORY_CARE_PROVIDER_SITE_OTHER): Payer: BC Managed Care – PPO | Admitting: Internal Medicine

## 2022-08-01 ENCOUNTER — Encounter (INDEPENDENT_AMBULATORY_CARE_PROVIDER_SITE_OTHER): Payer: Self-pay | Admitting: Internal Medicine

## 2022-08-01 VITALS — BP 100/63 | HR 74 | Temp 98.4°F | Ht 59.0 in | Wt 177.0 lb

## 2022-08-01 DIAGNOSIS — E669 Obesity, unspecified: Secondary | ICD-10-CM | POA: Diagnosis not present

## 2022-08-01 DIAGNOSIS — R632 Polyphagia: Secondary | ICD-10-CM

## 2022-08-01 DIAGNOSIS — R7303 Prediabetes: Secondary | ICD-10-CM | POA: Diagnosis not present

## 2022-08-01 DIAGNOSIS — Z6835 Body mass index (BMI) 35.0-35.9, adult: Secondary | ICD-10-CM

## 2022-08-01 DIAGNOSIS — E7800 Pure hypercholesterolemia, unspecified: Secondary | ICD-10-CM

## 2022-08-01 MED ORDER — QSYMIA 11.25-69 MG PO CP24
1.0000 | ORAL_CAPSULE | Freq: Every day | ORAL | 0 refills | Status: DC
Start: 2022-08-01 — End: 2022-08-30

## 2022-08-01 NOTE — Progress Notes (Signed)
Office: [phone]  /  Fax: [phone]  WEIGHT SUMMARY AND BIOMETRICS  Vitals Temp: 98.4 F (36.9 C) BP: 100/63 Pulse Rate: 74 SpO2: 97 %   Anthropometric Measurements Height: 4\' 11"  (1.499 m) Weight: 177 lb (80.3 kg) BMI (Calculated): 35.73 Weight at Last Visit: 170 lb Weight Lost Since Last Visit: 0 lb Weight Gained Since Last Visit: 7 lb Starting Weight: 214 lb Total Weight Loss (lbs): 40 lb (18.1 kg) Peak Weight: 225 lb   Body Composition  Body Fat %: 43.9 % Fat Mass (lbs): 77.8 lbs Muscle Mass (lbs): 94.6 lbs Total Body Water (lbs): 71 lbs Visceral Fat Rating : 12    No data recorded Today's Visit #: 21  Starting Date: 03/05/21   HPI  Chief Complaint: OBESITY  Kathleen Odonnell is here to discuss her progress with her obesity treatment plan. She is on the practicing portion control and making smarter food choices, such as increasing vegetables and decreasing simple carbohydrates and states she is following her eating plan approximately 50 % of the time. She states she is exercising 40 minutes 2 times per week.  Interval History:  Since last office visit she has gained 7 lbs. Melissasue has been off [place] now for 6 weeks she has been having a very difficult time due to increased hunger signals and impaired satiety.  She has been started on Qsymia and is currently at the 7.5 mg dose she does not feel the medication is helping.  She is not having any side effects.  Her peak weight had been 225 and she is concerned about weight regain.  She has not considered gastric bypass.  Pain for incretin therapy out-of-pocket is cost prohibitive. She reports fair adherence to reduced calorie nutritional plan. She has been working on not skipping meals, eating more fruits, eating more vegetables, and drinking more water  Orixegenic Control: since coming off wegovy Reports problems with appetite and hunger signals.  Reports problems with satiety and satiation.  Reports problems with  eating patterns and portion control.  Reports abnormal cravings. Denies feeling deprived or restricted.   Barriers identified: strong hunger signals and appetite.   Pharmacotherapy for weight loss: She is currently taking Qsymia with adequate clinical response  and without side effects.  Had been on Wegovy with great response but no longer has insurance coverage.  Her insurance plans may be switching in the near future.   ASSESSMENT AND PLAN  TREATMENT PLAN FOR OBESITY:  Recommended Dietary Goals  Oberia is currently in the action stage of change. As such, her goal is to continue weight management plan. She has agreed to: continue current plan and she does not feel she could track and journal even using apps because of her inattention.  Behavioral Intervention  We discussed the following Behavioral Modification Strategies today: increasing lean protein intake, decreasing simple carbohydrates , increasing vegetables, increasing lower glycemic fruits, increasing fiber rich foods, increasing water intake, avoiding temptations and identifying enticing environmental cues, continue to practice mindfulness when eating, and planning for success.  Additional resources provided today:  She was given a low-carb plan and ketogenic plan for her to review.  This may be used short-term to get her back on track on losing weight  Recommended Physical Activity Goals  Elwyn has been advised to work up to 150 minutes of moderate intensity aerobic activity a week and strengthening exercises 2-3 times per week for cardiovascular health, weight loss maintenance and preservation of muscle mass.   She has agreed to :  We discussed increasing the volume of physical activity particularly strengthening.  Pharmacotherapy We discussed various medication options to help Tassie with her weight loss efforts and we both agreed to : increase we will increase Qsymia to 11.25 mg dose , if we not see a response within 3  months medication will be discontinued as she may be a nonresponder.  ASSOCIATED CONDITIONS ADDRESSED TODAY  Prediabetes Assessment & Plan: Most recent A1c is  Lab Results  Component Value Date   HGBA1C 5.2 04/21/2022  and improved while she was on GLP-1.  Her insulin levels had also improved.  Medication has been discontinued due to coverage.   Continue with nutritional and behavioral strategies.  Currently transitioning from GLP-1 to Qsymia due to cost.  Will hold off adding metformin for pharmacoprophylaxis due to increased risk of metabolic acidosis on combination therapy.    Obesity: Current BMI 35.1 Assessment & Plan: Her peak weight is 225, she had gone down to 163 on Wegovy, that is 28% of total body weight.  Medication was discontinued in June due to lack of coverage.  She is unable to afford incretin therapy out-of-pocket.  She is now on Qsymia but is noticing an inadequate suppression in appetite as well as satiety.  We will increase Qsymia to 11.25 mg dose.  We again reviewed medication side effects.  I am holding off adding metformin because of slight increased risk of metabolic acidosis with topiramate.  She does not feel that she could track by did recommend that she at least measure the amount of protein to make sure she is getting 30 to 40 g per meal for adequate appetite suppression and thermic effect  Orders: -     Qsymia; Take 1 tablet by mouth daily.  Dispense: 30 capsule; Refill: 0  Pure hypercholesterolemia Assessment & Plan: LDL is not at goal but has improved his 120 down from 150. Elevated LDL may be secondary to nutrition, genetics and spillover effect from excess adiposity. Recommended LDL goal is <70 to reduce the risk of fatty streaks and the progression to obstructive ASCVD in the future. Her 10 year risk is: The 10-year ASCVD risk score (Arnett DK, et al., 2019) is: 1.1%  Lab Results  Component Value Date   CHOL 184 04/21/2022   HDL 51 04/21/2022    LDLCALC 120 (H) 04/21/2022   TRIG 70 04/21/2022    Continue weight loss therapy. Also advised to reduce saturated fats in diet to less than 10% of daily calories.  She does not benefit from statin therapy based on cardiovascular risk.    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 Patient has been off Wegovy now for 8 weeks and is on Qsymia 7.5 mg with inadequate clinical response. 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  We will increase Qsymia to 11.25 mg dose, she will monitor for side effects.  Patient will be screened further at the next office visit for possible disordered eating.     PHYSICAL EXAM:  Blood pressure 100/63, pulse 74, temperature 98.4 F (36.9 C), height 4\' 11"  (1.499 m), weight 177 lb (80.3 kg), SpO2 97%. Body mass index is 35.75 kg/m.  General: She is overweight, cooperative, alert, well developed, and in no acute distress. PSYCH: Has normal mood, affect and thought process.   HEENT: EOMI, sclerae are anicteric. Lungs: Normal breathing effort, no conversational dyspnea.  Extremities: No edema.  Neurologic: No gross sensory or motor deficits. No tremors or fasciculations noted.    DIAGNOSTIC DATA REVIEWED:  BMET    Component Value Date/Time   NA 142 04/21/2022 0917   K 4.0 04/21/2022 0917   CL 103 04/21/2022 0917   CO2 21 04/21/2022 0917   GLUCOSE 79 04/21/2022 0917   BUN 10 04/21/2022 0917   CREATININE 0.71 04/21/2022 0917   CALCIUM 8.6 (L) 04/21/2022 0917   Lab Results  Component Value Date   HGBA1C 5.2 04/21/2022   HGBA1C 5.8 (H) 03/04/2021   Lab Results  Component Value Date   INSULIN 4.6 04/21/2022   INSULIN 11.4 03/04/2021   Lab Results  Component Value Date   TSH 1.860 03/04/2021   CBC    Component Value Date/Time   WBC 8.5 11/08/2021 0803   WBC 12.6 (H) 06/28/2007 0550   RBC 4.40  11/08/2021 0803   RBC 3.09 (L) 06/28/2007 0550   HGB 12.1 11/08/2021 0803   HCT 36.3 11/08/2021 0803   PLT 201 06/28/2007 0550   MCV 83 11/08/2021 0803   MCH 27.5 11/08/2021 0803   MCHC 33.3 11/08/2021 0803   MCHC 33.8 06/28/2007 0550   RDW 15.6 (H) 11/08/2021 0803   Iron Studies    Component Value Date/Time   IRON 59 11/08/2021 0803   TIBC 294 11/08/2021 0803   FERRITIN 78 11/08/2021 0803   IRONPCTSAT 20 11/08/2021 0803   Lipid Panel     Component Value Date/Time   CHOL 184 04/21/2022 0917   TRIG 70 04/21/2022 0917   HDL 51 04/21/2022 0917   LDLCALC 120 (H) 04/21/2022 0917   Hepatic Function Panel     Component Value Date/Time   PROT 7.1 04/21/2022 0917   ALBUMIN 3.9 04/21/2022 0917   AST 15 04/21/2022 0917   ALT 8 04/21/2022 0917   ALKPHOS 80 04/21/2022 0917   BILITOT <0.2 04/21/2022 0917      Component Value Date/Time   TSH 1.860 03/04/2021 0953   Nutritional Lab Results  Component Value Date   VD25OH 47.5 04/21/2022   VD25OH 43.6 03/04/2021     Return in about 3 weeks (around 08/22/2022) for For Weight Mangement with Dr. Rikki Spearing.[name] She was informed of the importance of frequent follow up visits to maximize her success with intensive lifestyle modifications for her multiple health conditions.   ATTESTASTION STATEMENTS:  Reviewed by clinician on day of visit: allergies, medications, problem list, medical history, surgical history, family history, social history, and previous encounter notes.   I have spent 40 minutes in the care of the patient today including: preparing to see patient (e.g. review and interpretation of tests, old notes ), obtaining and/or reviewing separately obtained history, counseling and educating the patient, ordering medications, test and procedures, documenting clinical information in the electronic or other health care record, and independently interpreting results and communicating results to the patient,family, or  caregiver   Worthy Rancher, MD

## 2022-08-01 NOTE — Assessment & Plan Note (Addendum)
Her peak weight is 225, she had gone down to 163 on Wegovy, that is 28% of total body weight.  Medication was discontinued in June due to lack of coverage.  She is unable to afford incretin therapy out-of-pocket.  She is now on Qsymia but is noticing an inadequate suppression in appetite as well as satiety.  We will increase Qsymia to 11.25 mg dose.  We again reviewed medication side effects.  I am holding off adding metformin because of slight increased risk of metabolic acidosis with topiramate.  She does not feel that she could track by did recommend that she at least measure the amount of protein to make sure she is getting 30 to 40 g per meal for adequate appetite suppression and thermic effect

## 2022-08-01 NOTE — Assessment & Plan Note (Signed)
LDL is not at goal but has improved his 120 down from 150. Elevated LDL may be secondary to nutrition, genetics and spillover effect from excess adiposity. Recommended LDL goal is <70 to reduce the risk of fatty streaks and the progression to obstructive ASCVD in the future. Her 10 year risk is: The 10-year ASCVD risk score (Arnett DK, et al., 2019) is: 1.1%  Lab Results  Component Value Date   CHOL 184 04/21/2022   HDL 51 04/21/2022   LDLCALC 120 (H) 04/21/2022   TRIG 70 04/21/2022    Continue weight loss therapy. Also advised to reduce saturated fats in diet to less than 10% of daily calories.  She does not benefit from statin therapy based on cardiovascular risk.

## 2022-08-01 NOTE — Assessment & Plan Note (Signed)
Most recent A1c is  Lab Results  Component Value Date   HGBA1C 5.2 04/21/2022  and improved while she was on GLP-1.  Her insulin levels had also improved.  Medication has been discontinued due to coverage.   Continue with nutritional and behavioral strategies.  Currently transitioning from GLP-1 to Qsymia due to cost.  Will hold off adding metformin for pharmacoprophylaxis due to increased risk of metabolic acidosis on combination therapy.

## 2022-08-01 NOTE — Assessment & Plan Note (Signed)
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 Patient has been off Wegovy now for 8 weeks and is on Qsymia 7.5 mg with inadequate clinical response. 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  We will increase Qsymia to 11.25 mg dose, she will monitor for side effects.  Patient will be screened further at the next office visit for possible disordered eating.

## 2022-08-01 NOTE — Progress Notes (Deleted)
.smr  Office: [phone]  /  Fax: [phone]  WEIGHT SUMMARY AND BIOMETRICS  No data recorded No data recorded No data recorded No data recorded   HPI  Chief Complaint: OBESITY  Kathleen Odonnell is here to discuss her progress with her obesity treatment plan. She is on the practicing portion control and making smarter food choices, such as increasing vegetables and decreasing simple carbohydrates and states she is following her eating plan approximately 50 % of the time. She states she is exercising 40 minutes 2 times per week.   Interval History:  Since last office visit she *** Hunger/appetite-*** Cravings- *** Stress- *** Sleep- *** Exercise-*** Hydration-***   Pharmacotherapy: ***  TREATMENT PLAN FOR OBESITY:  Recommended Dietary Goals  Kathleen Odonnell is currently in the action stage of change. As such, her goal is to continue weight management plan. She has agreed to {MWMwtlossportion/plan2:23431}.  Behavioral Intervention  We discussed the following Behavioral Modification Strategies today: {EMWMwtlossstrategies:28914::"increasing lean protein intake","decreasing simple carbohydrates ","increasing vegetables","increasing lower glycemic fruits","increasing water intake","continue to practice mindfulness when eating","planning for success"}.  Additional resources provided today: NA  Recommended Physical Activity Goals  Kathleen Odonnell has been advised to work up to 150 minutes of moderate intensity aerobic activity a week and strengthening exercises 2-3 times per week for cardiovascular health, weight loss maintenance and preservation of muscle mass.   She has agreed to {EMEXERCISE:28847::"Think about ways to increase daily physical activity and overcoming barriers to exercise"}   Pharmacotherapy We discussed various medication options to help Kathleen Odonnell with her weight loss efforts and we both agreed to ***.    No follow-ups on file.[name] She was informed of the importance of frequent follow  up visits to maximize her success with intensive lifestyle modifications for her multiple health conditions.  PHYSICAL EXAM:  There were no vitals taken for this visit. There is no height or weight on file to calculate BMI.  General: She is overweight, cooperative, alert, well developed, and in no acute distress. PSYCH: Has normal mood, affect and thought process.   Lungs: Normal breathing effort, no conversational dyspnea.  DIAGNOSTIC DATA REVIEWED:  BMET    Component Value Date/Time   NA 142 04/21/2022 0917   K 4.0 04/21/2022 0917   CL 103 04/21/2022 0917   CO2 21 04/21/2022 0917   GLUCOSE 79 04/21/2022 0917   BUN 10 04/21/2022 0917   CREATININE 0.71 04/21/2022 0917   CALCIUM 8.6 (L) 04/21/2022 0917   Lab Results  Component Value Date   HGBA1C 5.2 04/21/2022   HGBA1C 5.8 (H) 03/04/2021   Lab Results  Component Value Date   INSULIN 4.6 04/21/2022   INSULIN 11.4 03/04/2021   Lab Results  Component Value Date   TSH 1.860 03/04/2021   CBC    Component Value Date/Time   WBC 8.5 11/08/2021 0803   WBC 12.6 (H) 06/28/2007 0550   RBC 4.40 11/08/2021 0803   RBC 3.09 (L) 06/28/2007 0550   HGB 12.1 11/08/2021 0803   HCT 36.3 11/08/2021 0803   PLT 201 06/28/2007 0550   MCV 83 11/08/2021 0803   MCH 27.5 11/08/2021 0803   MCHC 33.3 11/08/2021 0803   MCHC 33.8 06/28/2007 0550   RDW 15.6 (H) 11/08/2021 0803   Iron Studies    Component Value Date/Time   IRON 59 11/08/2021 0803   TIBC 294 11/08/2021 0803   FERRITIN 78 11/08/2021 0803   IRONPCTSAT 20 11/08/2021 0803   Lipid Panel     Component Value Date/Time   CHOL 184  04/21/2022 0917   TRIG 70 04/21/2022 0917   HDL 51 04/21/2022 0917   LDLCALC 120 (H) 04/21/2022 0917   Hepatic Function Panel     Component Value Date/Time   PROT 7.1 04/21/2022 0917   ALBUMIN 3.9 04/21/2022 0917   AST 15 04/21/2022 0917   ALT 8 04/21/2022 0917   ALKPHOS 80 04/21/2022 0917   BILITOT <0.2 04/21/2022 0917      Component  Value Date/Time   TSH 1.860 03/04/2021 0953   Nutritional Lab Results  Component Value Date   VD25OH 47.5 04/21/2022   VD25OH 43.6 03/04/2021    ASSOCIATED CONDITIONS ADDRESSED TODAY  ASSESSMENT AND PLAN  Problem List Items Addressed This Visit     Class 3 severe obesity with serious comorbidity and body mass index (BMI) of 40.0 to 44.9 in adult [facility])    ATTESTASTION STATEMENTS:  Reviewed by clinician on day of visit: allergies, medications, problem list, medical history, surgical history, family history, social history, and previous encounter notes.   I have personally spent 30 minutes total time today in preparation, patient care, nutritional counseling and documentation for this visit, including the following: review of clinical lab tests; review of medical tests/procedures/services.      Mardi Mainland, CMA

## 2022-08-02 ENCOUNTER — Encounter (HOSPITAL_COMMUNITY): Payer: Self-pay | Admitting: Psychiatry

## 2022-08-02 ENCOUNTER — Telehealth (HOSPITAL_COMMUNITY): Payer: BC Managed Care – PPO | Admitting: Psychiatry

## 2022-08-02 VITALS — Wt 177.0 lb

## 2022-08-02 DIAGNOSIS — F331 Major depressive disorder, recurrent, moderate: Secondary | ICD-10-CM | POA: Diagnosis not present

## 2022-08-02 DIAGNOSIS — F4310 Post-traumatic stress disorder, unspecified: Secondary | ICD-10-CM

## 2022-08-02 MED ORDER — TRAZODONE HCL 150 MG PO TABS
150.0000 mg | ORAL_TABLET | Freq: Every day | ORAL | 1 refills | Status: DC
Start: 2022-08-02 — End: 2022-09-13

## 2022-08-02 MED ORDER — CITALOPRAM HYDROBROMIDE 40 MG PO TABS: 40.0000 mg | ORAL_TABLET | Freq: Every day | ORAL | 1 refills | Status: DC
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
## 2022-08-25 ENCOUNTER — Ambulatory Visit (INDEPENDENT_AMBULATORY_CARE_PROVIDER_SITE_OTHER): Payer: BC Managed Care – PPO | Admitting: Internal Medicine

## 2022-08-25 ENCOUNTER — Encounter (INDEPENDENT_AMBULATORY_CARE_PROVIDER_SITE_OTHER): Payer: Self-pay | Admitting: Internal Medicine

## 2022-08-25 VITALS — BP 104/65 | HR 79 | Temp 98.5°F | Ht 59.0 in | Wt 188.0 lb

## 2022-08-25 DIAGNOSIS — Z6837 Body mass index (BMI) 37.0-37.9, adult: Secondary | ICD-10-CM

## 2022-08-25 DIAGNOSIS — R632 Polyphagia: Secondary | ICD-10-CM

## 2022-08-25 DIAGNOSIS — E559 Vitamin D deficiency, unspecified: Secondary | ICD-10-CM | POA: Diagnosis not present

## 2022-08-25 DIAGNOSIS — R7303 Prediabetes: Secondary | ICD-10-CM | POA: Diagnosis not present

## 2022-08-25 DIAGNOSIS — E669 Obesity, unspecified: Secondary | ICD-10-CM | POA: Diagnosis not present

## 2022-08-25 NOTE — Progress Notes (Deleted)
Office: [phone]  /  Fax: [phone]  WEIGHT SUMMARY AND BIOMETRICS  No data recorded No data recorded No data recorded  No data recorded No data recorded No data recorded  HPI  Chief Complaint: OBESITY  Kathleen Odonnell is here to discuss her progress with her obesity treatment plan. She is on the practicing portion control and making smarter food choices, such as increasing vegetables and decreasing simple carbohydrates and states she is following her eating plan approximately 60 % of the time. She states she is walking 1-2 miles a day. 3-4 times per week.  Interval History:  Since last office visit she has ***. She reports {EMADHERENCE:28838::"good adherence to reduced calorie nutritional plan."} She has been working on [employer]: {ACTIONS;DENIES/REPORTS:21021675::"Denies"} problems with appetite and hunger signals.  {ACTIONS;DENIES/REPORTS:21021675::"Denies"} problems with satiety and satiation.  {ACTIONS;DENIES/REPORTS:21021675::"Denies"} problems with eating patterns and portion control.  {ACTIONS;DENIES/REPORTS:21021675::"Denies"} abnormal cravings. {ACTIONS;DENIES/REPORTS:21021675::"Denies"} feeling deprived or restricted.   Barriers identified: {EMOBESITYBARRIERS:28841}.   Pharmacotherapy for weight loss: She is currently taking {EMPharmaco:28845}.    ASSESSMENT AND PLAN  TREATMENT PLAN FOR OBESITY:  Recommended Dietary Goals  Kathleen Odonnell is currently in the action stage of change. As such, her goal is to continue weight management plan. She has agreed to: {EMWTLOSSPLAN:29297::"continue current plan"}  Behavioral Intervention  We discussed the following Behavioral Modification Strategies today: {EMWMwtlossstrategies:28914::"increasing lean protein intake","decreasing simple carbohydrates ","increasing vegetables","increasing lower glycemic fruits","increasing water intake","continue to practice mindfulness when eating","planning for  success"}.  Additional resources provided today: {EMadditionalresources:29169::"None"}  Recommended Physical Activity Goals  Kathleen Odonnell has been advised to work up to 150 minutes of moderate intensity aerobic activity a week and strengthening exercises 2-3 times per week for cardiovascular health, weight loss maintenance and preservation of muscle mass.   She has agreed to :  {EMEXERCISE:28847::"Think about ways to increase daily physical activity and overcoming barriers to exercise"}  Pharmacotherapy We discussed various medication options to help Kathleen Odonnell with her weight loss efforts and we both agreed to : {EMagreedrx:29170::"continue with nutritional and behavioral strategies"}  ASSOCIATED CONDITIONS ADDRESSED TODAY  Obesity: Current BMI 35.1  Vitamin D deficiency    PHYSICAL EXAM:  There were no vitals taken for this visit. There is no height or weight on file to calculate BMI.  General: She is overweight, cooperative, alert, well developed, and in no acute distress. PSYCH: Has normal mood, affect and thought process.   HEENT: EOMI, sclerae are anicteric. Lungs: Normal breathing effort, no conversational dyspnea. Extremities: No edema.  Neurologic: No gross sensory or motor deficits. No tremors or fasciculations noted.    DIAGNOSTIC DATA REVIEWED:  BMET    Component Value Date/Time   NA 142 04/21/2022 0917   K 4.0 04/21/2022 0917   CL 103 04/21/2022 0917   CO2 21 04/21/2022 0917   GLUCOSE 79 04/21/2022 0917   BUN 10 04/21/2022 0917   CREATININE 0.71 04/21/2022 0917   CALCIUM 8.6 (L) 04/21/2022 0917   Lab Results  Component Value Date   HGBA1C 5.2 04/21/2022   HGBA1C 5.8 (H) 03/04/2021   Lab Results  Component Value Date   INSULIN 4.6 04/21/2022   INSULIN 11.4 03/04/2021   Lab Results  Component Value Date   TSH 1.860 03/04/2021   CBC    Component Value Date/Time   WBC 8.5 11/08/2021 0803   WBC 12.6 (H) 06/28/2007 0550   RBC 4.40 11/08/2021 0803   RBC  3.09 (L) 06/28/2007 0550   HGB 12.1 11/08/2021 0803   HCT 36.3 11/08/2021 0803  PLT 201 06/28/2007 0550   MCV 83 11/08/2021 0803   MCH 27.5 11/08/2021 0803   MCHC 33.3 11/08/2021 0803   MCHC 33.8 06/28/2007 0550   RDW 15.6 (H) 11/08/2021 0803   Iron Studies    Component Value Date/Time   IRON 59 11/08/2021 0803   TIBC 294 11/08/2021 0803   FERRITIN 78 11/08/2021 0803   IRONPCTSAT 20 11/08/2021 0803   Lipid Panel     Component Value Date/Time   CHOL 184 04/21/2022 0917   TRIG 70 04/21/2022 0917   HDL 51 04/21/2022 0917   LDLCALC 120 (H) 04/21/2022 0917   Hepatic Function Panel     Component Value Date/Time   PROT 7.1 04/21/2022 0917   ALBUMIN 3.9 04/21/2022 0917   AST 15 04/21/2022 0917   ALT 8 04/21/2022 0917   ALKPHOS 80 04/21/2022 0917   BILITOT <0.2 04/21/2022 0917      Component Value Date/Time   TSH 1.860 03/04/2021 0953   Nutritional Lab Results  Component Value Date   VD25OH 47.5 04/21/2022   VD25OH 43.6 03/04/2021     No follow-ups on file.[name] She was informed of the importance of frequent follow up visits to maximize her success with intensive lifestyle modifications for her multiple health conditions.   ATTESTASTION STATEMENTS:  Reviewed by clinician on day of visit: allergies, medications, problem list, medical history, surgical history, family history, social history, and previous encounter notes.     Worthy Rancher, MD

## 2022-08-25 NOTE — Assessment & Plan Note (Addendum)
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  She has lost 62 pounds or 28% of total body weight and since stopping Wegovy 3 months ago has been regaining weight.  She has been finding it very challenging to adhere to reduced calorie nutrition plan.  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  She is unable to afford incretin therapy.  I feel that patient benefits tremendously from medication.  An appeal for coverage may be necessary.  She has not had a clinical response to Qsymia, I therefore recommend discontinuation of medication.  She will taper medication off by taking 1 pill every other day.  She has problems with anxiety and also reports having untreated ADHD but I am concerned about using sympathomimetics alone as this may worsen her anxiety.  She does have a component of stress eating.  She is agreeable to seeing our behavioral health specialist.  She will be referred to Dr. Dewaine Conger.

## 2022-08-25 NOTE — Assessment & Plan Note (Addendum)
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 Patient has been off Wegovy now for 12 weeks and is on Qsymia 11.25 mg with inadequate clinical response.  Patient will be referred to Dr. Dewaine Conger for possible disordered eating.  Will reach out to her psychiatrist to see if we could start her on Wellbutrin, but I will like to defer that decision to her psychiatrist.  She has fluctuations in mood varying from depression to high levels of anxiety.

## 2022-08-25 NOTE — Progress Notes (Signed)
Office: [phone]  /  Fax: [phone]  WEIGHT SUMMARY AND BIOMETRICS  Vitals Temp: 98.5 F (36.9 C) BP: 104/65 Pulse Rate: 79 SpO2: 100 %   Anthropometric Measurements Height: 4\' 11"  (1.499 m) Weight: 188 lb (85.3 kg) BMI (Calculated): 37.95 Weight at Last Visit: 177lb Weight Gained Since Last Visit: 11lb Starting Weight: 214lb Total Weight Loss (lbs): 29 lb (13.2 kg) Peak Weight: 225lb   Body Composition  Body Fat %: 41.3 % Fat Mass (lbs): 77.8 lbs Muscle Mass (lbs): 105 lbs Total Body Water (lbs): 78.4 lbs Visceral Fat Rating : 12    No data recorded Today's Visit #: 22  Starting Date: 03/05/21   HPI  Chief Complaint: OBESITY  Kathleen Odonnell is here to discuss her progress with her obesity treatment plan. She is on the practicing portion Odonnell and making smarter food choices, such as increasing vegetables and decreasing simple carbohydrates and states she is following her eating plan approximately 60 % of the time. She states she is walking 1-2 miles a day. 3-4 times per week.  Interval History:  Since last office visit she has gained 11 lbs. She has come off wegovy about 12 weeks ago and has had very difficult time with hunger signals, impaired satiety and inhibitory Odonnell.  She is currently on Qsymia without clinical benefit.  She denies adverse effects to medication. She reports suboptimal adherence vision plan due to increased hunger signals.  She has increased consumption of carbohydrates which is likely increasing her insulin levels. She has been working on not skipping meals, increasing protein intake at every meal, and drinking more water  Kathleen Odonnell: Reports problems with appetite and hunger signals.  Reports problems with satiety and satiation.  Reports problems with eating patterns and portion Odonnell.  Reports abnormal cravings. Denies feeling deprived or restricted.   Barriers identified: strong hunger signals and appetite, cost of  medication, and moderate to high levels of stress.   Pharmacotherapy for weight loss: She is currently taking Qsymia without clinical response and without side effects..    ASSESSMENT AND PLAN  TREATMENT PLAN FOR OBESITY:  Recommended Dietary Goals  Kathleen Odonnell is currently in the action stage of change. As such, her goal is to continue weight management plan. She has agreed to: continue current plan  Behavioral Intervention  We discussed the following Behavioral Modification Strategies today: increasing lean protein intake, decreasing simple carbohydrates , increasing vegetables, increasing lower glycemic fruits, increasing fiber rich foods, increasing water intake, work on managing stress, creating time for self-care and relaxation measures, avoiding temptations and identifying enticing environmental cues, planning for success, and referral to Dr. Dewaine Conger for CBT and evaluation for possible disordered .  Additional resources provided today: None  Recommended Physical Activity Goals  Kathleen Odonnell has been advised to work up to 150 minutes of moderate intensity aerobic activity a week and strengthening exercises 2-3 times per week for cardiovascular health, weight loss maintenance and preservation of muscle mass.   She has agreed to :  Think about ways to increase daily physical activity and overcoming barriers to exercise  Pharmacotherapy We discussed various medication options to help Kathleen Odonnell with her weight loss efforts and we both agreed to :  Discontinue Qsymia, due to lack of clinical response. Reduce to every other day and then stop.   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 Patient has been  off Wegovy now for 12 weeks and is on Qsymia 11.25 mg with inadequate clinical response.  Patient will be referred to Dr. Dewaine Conger for  possible disordered eating.  Will reach out to her psychiatrist to see if we could start her on Wellbutrin, but I will like to defer that decision to her psychiatrist.  She has fluctuations in mood varying from depression to high levels of anxiety.   Obesity: Current BMI 35.1 Assessment & Plan: I suspect Kathleen Odonnell has a genetic form of obesity, she has not been formally tested but has very strong Kathleen signaling, reason why she had a significant clinical response to incretin therapy.  She has lost 62 pounds or 28% of total body weight and since stopping Wegovy 3 months ago has been regaining weight.  She has been finding it very challenging to adhere to reduced calorie nutrition plan.  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  She is unable to afford incretin therapy.  I feel that patient benefits tremendously from medication.  An appeal for coverage may be necessary.  She has not had a clinical response to Qsymia, I therefore recommend discontinuation of medication.  She will taper medication off by taking 1 pill every other day.  She has problems with anxiety and also reports having untreated ADHD but I am concerned about using sympathomimetics alone as this may worsen her anxiety.  She does have a component of stress eating.  She is agreeable to seeing our behavioral health specialist.  She will be referred to Dr. Dewaine Conger.   Orders: -     Hemoglobin A1c  Vitamin D deficiency  Prediabetes Assessment & Plan: Most recent A1c is  Lab Results  Component Value Date   HGBA1C 5.2 04/21/2022  and improved while she was on GLP-1.  Her insulin levels had also improved.  Medication has been discontinued due to coverage.   Unfortunately, patient has had significant setbacks as a result of discontinuation of GLP-1 due to coverage. She has gained weight and has increased caloric intake. We  will check A1c today.    Orders: -     Hemoglobin A1c    PHYSICAL EXAM:  Blood pressure 104/65, pulse 79, temperature 98.5 F (36.9 C), height 4\' 11"  (1.499 m), weight 188 lb (85.3 kg), SpO2 100%. Body mass index is 37.97 kg/m.  General: She is overweight, cooperative, alert, well developed, and in no acute distress. PSYCH: Has normal mood, affect and thought process.   HEENT: EOMI, sclerae are anicteric. Lungs: Normal breathing effort, no conversational dyspnea. Extremities: No edema.  Neurologic: No gross sensory or motor deficits. No tremors or fasciculations noted.    DIAGNOSTIC DATA REVIEWED:  BMET    Component Value Date/Time   NA 142 04/21/2022 0917   K 4.0 04/21/2022 0917   CL 103 04/21/2022 0917   CO2 21 04/21/2022 0917   GLUCOSE 79 04/21/2022 0917   BUN 10 04/21/2022 0917   CREATININE 0.71 04/21/2022 0917   CALCIUM 8.6 (L) 04/21/2022 0917   Lab Results  Component Value Date   HGBA1C 5.2 04/21/2022   HGBA1C 5.8 (H) 03/04/2021   Lab Results  Component Value Date   INSULIN 4.6 04/21/2022   INSULIN 11.4 03/04/2021   Lab Results  Component Value Date   TSH 1.860 03/04/2021   CBC    Component Value Date/Time   WBC 8.5 11/08/2021 0803   WBC 12.6 (H) 06/28/2007 0550  RBC 4.40 11/08/2021 0803   RBC 3.09 (L) 06/28/2007 0550   HGB 12.1 11/08/2021 0803   HCT 36.3 11/08/2021 0803   PLT 201 06/28/2007 0550   MCV 83 11/08/2021 0803   MCH 27.5 11/08/2021 0803   MCHC 33.3 11/08/2021 0803   MCHC 33.8 06/28/2007 0550   RDW 15.6 (H) 11/08/2021 0803   Iron Studies    Component Value Date/Time   IRON 59 11/08/2021 0803   TIBC 294 11/08/2021 0803   FERRITIN 78 11/08/2021 0803   IRONPCTSAT 20 11/08/2021 0803   Lipid Panel     Component Value Date/Time   CHOL 184 04/21/2022 0917   TRIG 70 04/21/2022 0917   HDL 51 04/21/2022 0917   LDLCALC 120 (H) 04/21/2022 0917   Hepatic Function Panel     Component Value Date/Time   PROT 7.1 04/21/2022 0917    ALBUMIN 3.9 04/21/2022 0917   AST 15 04/21/2022 0917   ALT 8 04/21/2022 0917   ALKPHOS 80 04/21/2022 0917   BILITOT <0.2 04/21/2022 0917      Component Value Date/Time   TSH 1.860 03/04/2021 0953   Nutritional Lab Results  Component Value Date   VD25OH 47.5 04/21/2022   VD25OH 43.6 03/04/2021     Return in about 3 weeks (around 09/15/2022) for refer to Dr. Dewaine Conger, For Weight Mangement with Dr. Rikki Spearing.[name] She was informed of the importance of frequent follow up visits to maximize her success with intensive lifestyle modifications for her multiple health conditions.   ATTESTASTION STATEMENTS:  Reviewed by clinician on day of visit: allergies, medications, problem list, medical history, surgical history, family history, social history, and previous encounter notes.   I have spent 40 minutes in the care of the patient today including: preparing to see patient (e.g. review and interpretation of tests, old notes ), obtaining and/or reviewing separately obtained history, performing a medically appropriate examination or evaluation, counseling and educating the patient, referring and communicating with other healthcare professionals, documenting clinical information in the electronic or other health care record, independently interpreting results and communicating results to the patient, family, or caregiver, and care coordination via electronic correspondence with Dr. Lolly Mustache her psychiatrist.   Worthy Rancher, MD

## 2022-08-25 NOTE — Assessment & Plan Note (Signed)
Most recent A1c is  Lab Results  Component Value Date   HGBA1C 5.2 04/21/2022  and improved while she was on GLP-1.  Her insulin levels had also improved.  Medication has been discontinued due to coverage.   Unfortunately, patient has had significant setbacks as a result of discontinuation of GLP-1 due to coverage. She has gained weight and has increased caloric intake. We will check A1c today.

## 2022-08-26 ENCOUNTER — Encounter (INDEPENDENT_AMBULATORY_CARE_PROVIDER_SITE_OTHER): Payer: Self-pay | Admitting: Internal Medicine

## 2022-08-26 LAB — HEMOGLOBIN A1C
Est. average glucose Bld gHb Est-mCnc: 114 mg/dL
Hgb A1c MFr Bld: 5.6 % (ref 4.8–5.6)

## 2022-08-30 ENCOUNTER — Other Ambulatory Visit (INDEPENDENT_AMBULATORY_CARE_PROVIDER_SITE_OTHER): Payer: Self-pay | Admitting: Internal Medicine

## 2022-08-30 DIAGNOSIS — R632 Polyphagia: Secondary | ICD-10-CM

## 2022-08-30 DIAGNOSIS — R7303 Prediabetes: Secondary | ICD-10-CM

## 2022-08-30 MED ORDER — OZEMPIC (0.25 OR 0.5 MG/DOSE) 2 MG/3ML ~~LOC~~ SOPN
0.5000 mg | PEN_INJECTOR | SUBCUTANEOUS | 0 refills | Status: AC
Start: 2022-08-30 — End: ?

## 2022-08-30 NOTE — Progress Notes (Signed)
Kathleen Odonnell has been having a difficult time coming off [facility].  Medication was discontinued due to lack of coverage.  She had been on maximum dose of medication which is what is making it so difficult.  She did not respond to Qsymia.  She had lost 62 pounds but has been gaining weight and her hemoglobin A1c is also increasing.  She is also feeling discouraged and has been stress eating.  I feel that she benefits from ongoing incretin therapy as this will help her with metabolic adaptations, reduction in hunger signals and improvement in inhibitory control, improve her quality of life and mood, and for diabetes prevention.  We will prescribe Ozempic 0.5 mg once a week.

## 2022-09-02 ENCOUNTER — Encounter (INDEPENDENT_AMBULATORY_CARE_PROVIDER_SITE_OTHER): Payer: Self-pay | Admitting: Internal Medicine

## 2022-09-06 ENCOUNTER — Telehealth (INDEPENDENT_AMBULATORY_CARE_PROVIDER_SITE_OTHER): Payer: Self-pay

## 2022-09-06 ENCOUNTER — Encounter (INDEPENDENT_AMBULATORY_CARE_PROVIDER_SITE_OTHER): Payer: Self-pay

## 2022-09-06 NOTE — Telephone Encounter (Signed)
Prior auth submitted to insurance. Awaiting response Key: [id number]

## 2022-09-06 NOTE — Telephone Encounter (Signed)
Pt does not want to pay $350.00 for Zepbound and would like refill of Qsymia

## 2022-09-08 NOTE — Telephone Encounter (Signed)
Denied due to patient not have a diagnosis of type 2 diabetes.

## 2022-09-12 ENCOUNTER — Other Ambulatory Visit (INDEPENDENT_AMBULATORY_CARE_PROVIDER_SITE_OTHER): Payer: Self-pay | Admitting: Internal Medicine

## 2022-09-12 ENCOUNTER — Telehealth (INDEPENDENT_AMBULATORY_CARE_PROVIDER_SITE_OTHER): Payer: Self-pay

## 2022-09-12 DIAGNOSIS — E66813 Obesity, class 3: Secondary | ICD-10-CM

## 2022-09-12 MED ORDER — TOPIRAMATE 25 MG PO TABS
25.0000 mg | ORAL_TABLET | Freq: Every evening | ORAL | 0 refills | Status: AC
Start: 2022-09-12 — End: ?

## 2022-09-12 MED ORDER — PHENTERMINE HCL 37.5 MG PO TABS
18.7500 mg | ORAL_TABLET | Freq: Every day | ORAL | 0 refills | Status: AC
Start: 2022-09-12 — End: ?

## 2022-09-12 NOTE — Telephone Encounter (Signed)
Spoke with pt and advised that she will need to come I to review documents and sign consent forms before an appointment with Dr. Dewaine Conger can be scheduled.

## 2022-09-12 NOTE — Progress Notes (Unsigned)
Hold

## 2022-09-13 ENCOUNTER — Telehealth (INDEPENDENT_AMBULATORY_CARE_PROVIDER_SITE_OTHER): Payer: Self-pay

## 2022-09-13 ENCOUNTER — Encounter (HOSPITAL_COMMUNITY): Payer: Self-pay | Admitting: Psychiatry

## 2022-09-13 ENCOUNTER — Encounter (INDEPENDENT_AMBULATORY_CARE_PROVIDER_SITE_OTHER): Payer: Self-pay

## 2022-09-13 ENCOUNTER — Telehealth (HOSPITAL_BASED_OUTPATIENT_CLINIC_OR_DEPARTMENT_OTHER): Payer: BC Managed Care – PPO | Admitting: Psychiatry

## 2022-09-13 VITALS — Wt 188.0 lb

## 2022-09-13 DIAGNOSIS — F4310 Post-traumatic stress disorder, unspecified: Secondary | ICD-10-CM

## 2022-09-13 DIAGNOSIS — F331 Major depressive disorder, recurrent, moderate: Secondary | ICD-10-CM | POA: Diagnosis not present

## 2022-09-13 MED ORDER — CITALOPRAM HYDROBROMIDE 40 MG PO TABS: 40.0000 mg | ORAL_TABLET | Freq: Every day | ORAL | 2 refills | Status: DC

## 2022-09-13 MED ORDER — TRAZODONE HCL 150 MG PO TABS
150.0000 mg | ORAL_TABLET | Freq: Every day | ORAL | 2 refills | Status: DC
Start: 2022-09-13 — End: 2022-12-22

## 2022-09-13 NOTE — Telephone Encounter (Signed)
PA for Phentermine 37.5 mg )1/2 tab daily) started
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
## 2022-09-13 NOTE — Telephone Encounter (Signed)
Hello Johnette,  The PA for Phentermine has been denied. You can go on [employer], I believe it is around 17.00.  Have a nice day, Rosezella [place]. CMA

## 2022-09-13 NOTE — Telephone Encounter (Signed)
PA denied, pt advised to look at [facility], average cost around $17

## 2022-09-19 ENCOUNTER — Encounter (INDEPENDENT_AMBULATORY_CARE_PROVIDER_SITE_OTHER): Payer: Self-pay | Admitting: Internal Medicine

## 2022-09-24 ENCOUNTER — Encounter (HOSPITAL_COMMUNITY): Payer: Self-pay

## 2022-10-06 ENCOUNTER — Ambulatory Visit (INDEPENDENT_AMBULATORY_CARE_PROVIDER_SITE_OTHER): Payer: BC Managed Care – PPO | Admitting: Internal Medicine

## 2022-10-06 ENCOUNTER — Other Ambulatory Visit (INDEPENDENT_AMBULATORY_CARE_PROVIDER_SITE_OTHER): Payer: Self-pay | Admitting: Internal Medicine

## 2022-10-06 ENCOUNTER — Encounter (INDEPENDENT_AMBULATORY_CARE_PROVIDER_SITE_OTHER): Payer: Self-pay | Admitting: Internal Medicine

## 2022-10-06 VITALS — BP 99/66 | HR 89 | Temp 98.3°F | Ht 59.0 in | Wt 193.0 lb

## 2022-10-06 DIAGNOSIS — F900 Attention-deficit hyperactivity disorder, predominantly inattentive type: Secondary | ICD-10-CM | POA: Diagnosis not present

## 2022-10-06 DIAGNOSIS — R638 Other symptoms and signs concerning food and fluid intake: Secondary | ICD-10-CM

## 2022-10-06 DIAGNOSIS — E66813 Obesity, class 3: Secondary | ICD-10-CM | POA: Diagnosis not present

## 2022-10-06 DIAGNOSIS — R632 Polyphagia: Secondary | ICD-10-CM

## 2022-10-06 DIAGNOSIS — Z6838 Body mass index (BMI) 38.0-38.9, adult: Secondary | ICD-10-CM

## 2022-10-06 MED ORDER — TOPIRAMATE 50 MG PO TABS
50.0000 mg | ORAL_TABLET | Freq: Every evening | ORAL | 0 refills | Status: DC
Start: 2022-10-06 — End: 2022-11-03

## 2022-10-06 MED ORDER — PHENTERMINE HCL 37.5 MG PO TABS: 18.7500 mg | ORAL_TABLET | Freq: Every day | ORAL | 0 refills | Status: DC

## 2022-10-06 NOTE — Progress Notes (Signed)
Office: [phone]  /  Fax: [phone]  WEIGHT SUMMARY AND BIOMETRICS  Vitals Temp: 98.3 F (36.8 C) BP: 99/66 Pulse Rate: 89 SpO2: 100 %   Anthropometric Measurements Height: 4\' 11"  (1.499 m) Weight: 193 lb (87.5 kg) BMI (Calculated): 38.96 Weight at Last Visit: 188 lb Weight Lost Since Last Visit: 0 lb Weight Gained Since Last Visit: 5 lb Starting Weight: 214 lb Total Weight Loss (lbs): 24 lb (10.9 kg) Peak Weight: 225 lb   Body Composition  Body Fat %: 45.9 % Fat Mass (lbs): 89 lbs Muscle Mass (lbs): 99.4 lbs Total Body Water (lbs): 74.4 lbs Visceral Fat Rating : 14    No data recorded Today's Visit #: 23  Starting Date: 03/05/21    Chief Complaint: OBESITY  HPI  Discussed the use of AI scribe software for clinical note transcription with the patient, who gave verbal consent to proceed.  History of Present Illness    The patient, with a history of obesity and ADHD, presents for a follow-up consultation. She reports no significant changes in hunger, sense of fullness, or appetite control despite being on a new medication regimen. 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  The patient also reports a recent diagnosis of ADHD and has been on Adderall for a week. She has not noticed any changes in her ADHD symptoms since starting the medication. The patient is also on other medications, including Celexa for anxiety and Trazodone for sleep.  The patient has been exercising regularly, walking at least a mile and a half five days a week. However, she has been struggling with cravings for carbohydrates and has noticed an increase in weight. She has been trying to manage these cravings by drinking water and avoiding bringing tempting foods into her home.  Previously, the patient was on [facility] for weight  management, but she stopped taking it around seven months ago. She was also on Qsymia, but she felt that it was not as strong as Wegovy. The patient's insurance denied coverage for Ozempic, another medication she tried.  The patient has been taking phentermine and topiramate for weight management. She reports issues with dry mouth as a side effect of these medications, which she manages by drinking water and using sugar-free lozenges. She also takes biotin for this issue. The patient has been taking her weight once a week and has noticed a trend of weight gain.      Barriers identified: lack of time for self-care, lack of partner or family support, strong hunger signals and appetite, cost of medication, difficulty maintaining a reduced calorie state, and moderate to high levels of stress.   Pharmacotherapy for weight loss: She is currently taking Topiramate (off label use, single agent) with adequate clinical response  and without side effects. and Phentermine (longterm use, single agent)  with adequate clinical response  and without side effects..   Patient was recently started on Adderall, she states that psychiatrist is aware that she is on phentermine.   ASSESSMENT AND PLAN  TREATMENT PLAN FOR OBESITY:  Recommended Dietary Goals  Carmalita is currently in the action stage of change. As such, her goal is to continue weight management plan. She has agreed to: continue current plan  Behavioral Intervention  We discussed the following Behavioral Modification Strategies today: increasing lean protein intake, decreasing simple carbohydrates , increasing vegetables, increasing lower glycemic  fruits, increasing fiber rich foods, avoiding skipping meals, increasing water intake, work on meal planning and preparation, work on managing stress, creating time for self-care and relaxation measures, avoiding temptations and identifying enticing environmental cues, continue to practice mindfulness when  eating, and planning for success.  Additional resources provided today: None  Recommended Physical Activity Goals  Kathleen Odonnell has been advised to work up to 150 minutes of moderate intensity aerobic activity a week and strengthening exercises 2-3 times per week for cardiovascular health, weight loss maintenance and preservation of muscle mass.   She has agreed to :  Think about ways to increase daily physical activity and overcoming barriers to exercise  Pharmacotherapy We discussed various medication options to help Maniyah with her weight loss efforts and we both agreed to : increase topiramate to 50 mg in the evening continue phentermine at current dose.  She was recently started on Adderall by psychiatry I explained to her that this increases the risk of tachycardia and elevations in blood pressure but she feels she needs medication.  We may discontinue phentermine  ASSOCIATED CONDITIONS ADDRESSED TODAY  Class 3 severe obesity with serious comorbidity and body mass index (BMI) of 40.0 to 44.9 in adult, unspecified obesity type [facility]) Assessment & Plan: Assessment and Plan    Following the discontinuation of Wegovy in March, she has experienced weight gain without significant changes in hunger, sense of fullness, or appetite control on her current regimen. Stress eating has been a significant factor. She recently began Adderall for ADHD, which may aid in weight loss. We will increase Topiramate to 50mg  at night and continue Phentermine 18.75mg  daily.  We may discontinue phentermine due to the addition of Adderall to avoid potential side effects.  Meal replacements with protein shakes will replace one meal daily, and two meals on two days of the week. She will be referred to Dr. Dewaine Conger for stress eating management. Blood pressure and heart rate will be checked, with a follow-up in three weeks.    General Health Maintenance She is encouraged to continue her regular exercise regimen of walking 1.5  miles at least five days a week. Increasing water intake is advised to manage the dry mouth side effect from medications. She is advised to avoid processed carbs and increase protein intake to manage cravings and promote weight loss.         Obesity- Start BMI 43.22 -     Phentermine HCl; Take 0.5 tablets (18.75 mg total) by mouth daily before breakfast.  Dispense: 15 tablet; Refill: 0  Abnormal food appetite -     Phentermine HCl; Take 0.5 tablets (18.75 mg total) by mouth daily before breakfast.  Dispense: 15 tablet; Refill: 0 -     Topiramate; Take 1 tablet (50 mg total) by mouth every evening.  Dispense: 30 tablet; Refill: 0  Attention deficit hyperactivity disorder (ADHD), predominantly inattentive type Assessment & Plan: Recently diagnosed by her psychiatrist and was started on Adderall while also being on phentermine.  We need to monitor closely as both are sympathomimetics we may discontinue phentermine and just maintain Adderall and topiramate in the future.  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     PHYSICAL EXAM:  Blood pressure 99/66, pulse 89, temperature 98.3 F (36.8 C), height 4\' 11"  (1.499 m), weight 193 lb (87.5 kg), SpO2 100%. Body mass index is 38.98 kg/m.  General: She is overweight,  cooperative, alert, well developed, and in no acute distress. PSYCH: Has normal mood, affect and thought process.   HEENT: EOMI, sclerae are anicteric. Lungs: Normal breathing effort, no conversational dyspnea. Extremities: No edema.  Neurologic: No gross sensory or motor deficits. No tremors or fasciculations noted.    DIAGNOSTIC DATA REVIEWED:  BMET    Component Value Date/Time   NA 142 04/21/2022 0917   K 4.0 04/21/2022 0917   CL 103 04/21/2022 0917   CO2 21 04/21/2022 0917   GLUCOSE 79 04/21/2022 0917   BUN 10 04/21/2022 0917   CREATININE  0.71 04/21/2022 0917   CALCIUM 8.6 (L) 04/21/2022 0917   Lab Results  Component Value Date   HGBA1C 5.6 08/25/2022   HGBA1C 5.8 (H) 03/04/2021   Lab Results  Component Value Date   INSULIN 4.6 04/21/2022   INSULIN 11.4 03/04/2021   Lab Results  Component Value Date   TSH 1.860 03/04/2021   CBC    Component Value Date/Time   WBC 8.5 11/08/2021 0803   WBC 12.6 (H) 06/28/2007 0550   RBC 4.40 11/08/2021 0803   RBC 3.09 (L) 06/28/2007 0550   HGB 12.1 11/08/2021 0803   HCT 36.3 11/08/2021 0803   PLT 201 06/28/2007 0550   MCV 83 11/08/2021 0803   MCH 27.5 11/08/2021 0803   MCHC 33.3 11/08/2021 0803   MCHC 33.8 06/28/2007 0550   RDW 15.6 (H) 11/08/2021 0803   Iron Studies    Component Value Date/Time   IRON 59 11/08/2021 0803   TIBC 294 11/08/2021 0803   FERRITIN 78 11/08/2021 0803   IRONPCTSAT 20 11/08/2021 0803   Lipid Panel     Component Value Date/Time   CHOL 184 04/21/2022 0917   TRIG 70 04/21/2022 0917   HDL 51 04/21/2022 0917   LDLCALC 120 (H) 04/21/2022 0917   Hepatic Function Panel     Component Value Date/Time   PROT 7.1 04/21/2022 0917   ALBUMIN 3.9 04/21/2022 0917   AST 15 04/21/2022 0917   ALT 8 04/21/2022 0917   ALKPHOS 80 04/21/2022 0917   BILITOT <0.2 04/21/2022 0917      Component Value Date/Time   TSH 1.860 03/04/2021 0953   Nutritional Lab Results  Component Value Date   VD25OH 47.5 04/21/2022   VD25OH 43.6 03/04/2021     Return in about 3 weeks (around 10/27/2022) for referral to Dr. Dewaine Conger for stress eating, please complete signup today... She was informed of the importance of frequent follow up visits to maximize her success with intensive lifestyle modifications for her multiple health conditions.   ATTESTASTION STATEMENTS:  Reviewed by clinician on day of visit: allergies, medications, problem list, medical history, surgical history, family history, social history, and previous encounter notes.     Worthy Rancher, MD

## 2022-10-07 DIAGNOSIS — F900 Attention-deficit hyperactivity disorder, predominantly inattentive type: Secondary | ICD-10-CM | POA: Insufficient documentation

## 2022-10-07 DIAGNOSIS — R638 Other symptoms and signs concerning food and fluid intake: Secondary | ICD-10-CM | POA: Insufficient documentation

## 2022-10-07 NOTE — Assessment & Plan Note (Signed)
Recently diagnosed by her psychiatrist and was started on Adderall while also being on phentermine.  We need to monitor closely as both are sympathomimetics we may discontinue phentermine and just maintain Adderall and topiramate in the future.  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

## 2022-10-07 NOTE — Assessment & Plan Note (Signed)
Assessment and Plan    Following the discontinuation of Wegovy in March, she has experienced weight gain without significant changes in hunger, sense of fullness, or appetite control on her current regimen. Stress eating has been a significant factor. She recently began Adderall for ADHD, which may aid in weight loss. We will increase Topiramate to 50mg  at night and continue Phentermine 18.75mg  daily.  We may discontinue phentermine due to the addition of Adderall to avoid potential side effects.  Meal replacements with protein shakes will replace one meal daily, and two meals on two days of the week. She will be referred to Dr. Dewaine Conger for stress eating management. Blood pressure and heart rate will be checked, with a follow-up in three weeks.    General Health Maintenance She is encouraged to continue her regular exercise regimen of walking 1.5 miles at least five days a week. Increasing water intake is advised to manage the dry mouth side effect from medications. She is advised to avoid processed carbs and increase protein intake to manage cravings and promote weight loss.

## 2022-10-11 ENCOUNTER — Telehealth (INDEPENDENT_AMBULATORY_CARE_PROVIDER_SITE_OTHER): Payer: Self-pay

## 2022-10-11 NOTE — Telephone Encounter (Signed)
Prior Kathleen Odonnell has been submitted for Phentermine. Awaiting determination.

## 2022-10-11 NOTE — Telephone Encounter (Signed)
Patient notified. See mychart message.

## 2022-10-24 ENCOUNTER — Telehealth (INDEPENDENT_AMBULATORY_CARE_PROVIDER_SITE_OTHER): Payer: BC Managed Care – PPO | Admitting: Psychology

## 2022-10-24 DIAGNOSIS — F5089 Other specified eating disorder: Secondary | ICD-10-CM | POA: Diagnosis not present

## 2022-10-24 DIAGNOSIS — F411 Generalized anxiety disorder: Secondary | ICD-10-CM | POA: Diagnosis not present

## 2022-10-24 DIAGNOSIS — F909 Attention-deficit hyperactivity disorder, unspecified type: Secondary | ICD-10-CM

## 2022-10-24 DIAGNOSIS — F331 Major depressive disorder, recurrent, moderate: Secondary | ICD-10-CM | POA: Diagnosis not present
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
## 2022-11-03 ENCOUNTER — Encounter (INDEPENDENT_AMBULATORY_CARE_PROVIDER_SITE_OTHER): Payer: Self-pay | Admitting: Internal Medicine

## 2022-11-03 ENCOUNTER — Ambulatory Visit (INDEPENDENT_AMBULATORY_CARE_PROVIDER_SITE_OTHER): Payer: BC Managed Care – PPO | Admitting: Internal Medicine

## 2022-11-03 ENCOUNTER — Other Ambulatory Visit (INDEPENDENT_AMBULATORY_CARE_PROVIDER_SITE_OTHER): Payer: Self-pay | Admitting: Internal Medicine

## 2022-11-03 VITALS — BP 116/73 | HR 95 | Temp 98.4°F | Ht 59.0 in | Wt 203.0 lb

## 2022-11-03 DIAGNOSIS — R7303 Prediabetes: Secondary | ICD-10-CM | POA: Diagnosis not present

## 2022-11-03 DIAGNOSIS — R638 Other symptoms and signs concerning food and fluid intake: Secondary | ICD-10-CM

## 2022-11-03 DIAGNOSIS — Z6841 Body Mass Index (BMI) 40.0 and over, adult: Secondary | ICD-10-CM

## 2022-11-03 DIAGNOSIS — E669 Obesity, unspecified: Secondary | ICD-10-CM | POA: Diagnosis not present

## 2022-11-03 MED ORDER — TOPIRAMATE 50 MG PO TABS: 50.0000 mg | ORAL_TABLET | Freq: Two times a day (BID) | ORAL | 0 refills | Status: DC

## 2022-11-03 NOTE — Assessment & Plan Note (Signed)
Most recent A1c is  Lab Results  Component Value Date   HGBA1C 5.6 08/25/2022  and improved while she was on GLP-1.  Her insulin levels had also improved.  Medication has been discontinued due to coverage.   Unfortunately, patient has had significant setbacks as a result of discontinuation of GLP-1 due to coverage. She has gained weight and has increased caloric intake.   I will avoid metformin for pharmacoprophylaxis as she is on topiramate and this may increase the risk of metabolic acidosis.

## 2022-11-03 NOTE — Assessment & Plan Note (Signed)
She has increased orexigenic signaling, impaired satiety and inhibitory control.  I suspect she may have underlying binge eating disorder.  This is secondary to an abnormal energy regulation system and pathological neurohormonal pathways characteristic of excess adiposity.  It is also psychological.  In addition to nutritional and behavioral strategies she benefits from pharmacotherapy and responded to GLP-1 therapy but this is now cost prohibitive.  She has been referred to Dr. Dewaine Conger for cognitive behavioral therapy.  I advised patient to discuss possibly starting Vyvanse by psychiatry for her ADHD.  We are decreasing topiramate to 50 mg twice a day.

## 2022-11-03 NOTE — Progress Notes (Signed)
Office: [phone]  /  Fax: [phone]  WEIGHT SUMMARY AND BIOMETRICS  Vitals Temp: 98.4 F (36.9 C) BP: 116/73 Pulse Rate: 95 SpO2: 100 %   Anthropometric Measurements Height: 4\' 11"  (1.499 m) Weight: 203 lb (92.1 kg) BMI (Calculated): 40.98 Weight at Last Visit: 193 lb Weight Lost Since Last Visit: 0 lb Weight Gained Since Last Visit: 10 lb Starting Weight: 214 lb Total Weight Loss (lbs): 14 lb (6.35 kg) Peak Weight: 225 lb   Body Composition  Body Fat %: 48.9 % Fat Mass (lbs): 99.4 lbs Muscle Mass (lbs): 98.8 lbs Total Body Water (lbs): 79.4 lbs Visceral Fat Rating : 15    No data recorded Today's Visit #: 24  Starting Date: 03/05/21   HPI  Chief Complaint: OBESITY  Kathleen Odonnell is here to discuss her progress with her obesity treatment plan. She is on the practicing portion control and making smarter food choices, such as increasing vegetables and decreasing simple carbohydrates and states she is following her eating plan approximately 0 % of the time. She states she is exercising 30 minutes 5 times per week.  Interval History:  Since last office visit she has gained 10 pounds. She reports  not following plan and having difficulty controlling her eating.  She reports she is surrounded by highly palatable foods at work and at home.  She feels like she lacks self-control but has not put barriers in place.  I did refer her to behavioral health specialist for possible eating disorder and she has had 1 session. She is currently on phentermine and topiramate but her psychiatrist wants to start her on Adderall.  I do not think she is having a clinical response to the phentermine therefore medication will be discontinued today.  She denies any adverse effects.  Orexigenic Control: Reports problems with appetite and hunger signals.  Reports problems with satiety and satiation.  Reports problems with eating patterns and portion control.  Reports abnormal  cravings. Denies feeling deprived or restricted.   Barriers identified: none, cost of medication, strong hunger signals and impaired satiety / inhibitory control, having difficulty with meal prep and planning, having difficulty focusing on healthy eating, exposure to enticing environments and/or relationships, difficulty implementing reduced calorie nutrition plan, difficulty maintaining a reduced calorie state, and moderate to high levels of stress.   Pharmacotherapy for weight loss: She is currently taking Topiramate (off label use, single agent) without clinical response and without side effects. and Phentermine (longterm use, single agent)  without clinical response and without side effects..    ASSESSMENT AND PLAN  TREATMENT PLAN FOR OBESITY:  Recommended Dietary Goals  Kathleen Odonnell is currently in the action stage of change. As such, her goal is to continue weight management plan. She has agreed to:  Patient again given a copy of category 2 plan she needs to reset but her orixegenic signaling and lack of inhibitory control makes it difficult for her to implement and maintain a reduced calorie state.  Behavioral Intervention  We discussed the following Behavioral Modification Strategies today:  Patient has been referred to Dr. Dewaine Conger for cognitive behavioral therapy and will continue with counseling.  She also needs to work on creating some barriers between her and highly palatable foods.  She also needs to change her mindset from all or none versus at least some healthy foods as part of her nutrition. .  Additional resources provided today: None  Recommended Physical Activity Goals  Kathleen Odonnell has been advised to work up to 150 minutes  of moderate intensity aerobic activity a week and strengthening exercises 2-3 times per week for cardiovascular health, weight loss maintenance and preservation of muscle mass.   She has agreed to :  Think about enjoyable ways to increase daily physical activity  and overcoming barriers to exercise and Increase physical activity in their day and reduce sedentary time (increase NEAT).  Pharmacotherapy We discussed various medication options to help Kathleen Odonnell with her weight loss efforts and we both agreed to :  Since patient is requiring amphetamines for her attention deficit we will discontinue phentermine there is also been a lack of clinical response.  If her Adderall is in shortage and may consider putting her on Vyvanse which may help with the binge eating as well.  After discussion of benefits and side effects we will increase her topiramate to 50 mg twice a day.  ASSOCIATED CONDITIONS ADDRESSED TODAY  Prediabetes Assessment & Plan: Most recent A1c is  Lab Results  Component Value Date   HGBA1C 5.6 08/25/2022  and improved while she was on GLP-1.  Her insulin levels had also improved.  Medication has been discontinued due to coverage.   Unfortunately, patient has had significant setbacks as a result of discontinuation of GLP-1 due to coverage. She has gained weight and has increased caloric intake.   I will avoid metformin for pharmacoprophylaxis as she is on topiramate and this may increase the risk of metabolic acidosis.    Obesity- Start BMI 43.22 Assessment & Plan: See obesity treatment plan   Abnormal food appetite Assessment & Plan: She has increased orexigenic signaling, impaired satiety and inhibitory control.  I suspect she may have underlying binge eating disorder.  This is secondary to an abnormal energy regulation system and pathological neurohormonal pathways characteristic of excess adiposity.  It is also psychological.  In addition to nutritional and behavioral strategies she benefits from pharmacotherapy and responded to GLP-1 therapy but this is now cost prohibitive.  She has been referred to Dr. Dewaine Conger for cognitive behavioral therapy.  I advised patient to discuss possibly starting Vyvanse by psychiatry for her ADHD.  We are  decreasing topiramate to 50 mg twice a day.   Orders: -     Topiramate; Take 1 tablet (50 mg total) by mouth 2 (two) times daily.  Dispense: 60 tablet; Refill: 0    PHYSICAL EXAM:  Blood pressure 116/73, pulse 95, temperature 98.4 F (36.9 C), height 4\' 11"  (1.499 m), weight 203 lb (92.1 kg), last menstrual period 10/18/2022, SpO2 100%. Body mass index is 41 kg/m.  General: She is overweight, cooperative, alert, well developed, and in no acute distress. PSYCH: Has normal mood, affect and thought process.   HEENT: EOMI, sclerae are anicteric. Lungs: Normal breathing effort, no conversational dyspnea. Extremities: No edema.  Neurologic: No gross sensory or motor deficits. No tremors or fasciculations noted.    DIAGNOSTIC DATA REVIEWED:  BMET    Component Value Date/Time   NA 142 04/21/2022 0917   K 4.0 04/21/2022 0917   CL 103 04/21/2022 0917   CO2 21 04/21/2022 0917   GLUCOSE 79 04/21/2022 0917   BUN 10 04/21/2022 0917   CREATININE 0.71 04/21/2022 0917   CALCIUM 8.6 (L) 04/21/2022 0917   Lab Results  Component Value Date   HGBA1C 5.6 08/25/2022   HGBA1C 5.8 (H) 03/04/2021   Lab Results  Component Value Date   INSULIN 4.6 04/21/2022   INSULIN 11.4 03/04/2021   Lab Results  Component Value Date   TSH 1.860  03/04/2021   CBC    Component Value Date/Time   WBC 8.5 11/08/2021 0803   WBC 12.6 (H) 06/28/2007 0550   RBC 4.40 11/08/2021 0803   RBC 3.09 (L) 06/28/2007 0550   HGB 12.1 11/08/2021 0803   HCT 36.3 11/08/2021 0803   PLT 201 06/28/2007 0550   MCV 83 11/08/2021 0803   MCH 27.5 11/08/2021 0803   MCHC 33.3 11/08/2021 0803   MCHC 33.8 06/28/2007 0550   RDW 15.6 (H) 11/08/2021 0803   Iron Studies    Component Value Date/Time   IRON 59 11/08/2021 0803   TIBC 294 11/08/2021 0803   FERRITIN 78 11/08/2021 0803   IRONPCTSAT 20 11/08/2021 0803   Lipid Panel     Component Value Date/Time   CHOL 184 04/21/2022 0917   TRIG 70 04/21/2022 0917   HDL 51  04/21/2022 0917   LDLCALC 120 (H) 04/21/2022 0917   Hepatic Function Panel     Component Value Date/Time   PROT 7.1 04/21/2022 0917   ALBUMIN 3.9 04/21/2022 0917   AST 15 04/21/2022 0917   ALT 8 04/21/2022 0917   ALKPHOS 80 04/21/2022 0917   BILITOT <0.2 04/21/2022 0917      Component Value Date/Time   TSH 1.860 03/04/2021 0953   Nutritional Lab Results  Component Value Date   VD25OH 47.5 04/21/2022   VD25OH 43.6 03/04/2021     Return in about 2 weeks (around 11/17/2022) for For Weight Mangement with Dr. Rikki Spearing.[name] She was informed of the importance of frequent follow up visits to maximize her success with intensive lifestyle modifications for her multiple health conditions.   ATTESTASTION STATEMENTS:  Reviewed by clinician on day of visit: allergies, medications, problem list, medical history, surgical history, family history, social history, and previous encounter notes.     Worthy Rancher, MD

## 2022-11-03 NOTE — Assessment & Plan Note (Signed)
 See obesity treatment plan

## 2022-11-07 ENCOUNTER — Other Ambulatory Visit (HOSPITAL_COMMUNITY): Payer: Self-pay

## 2022-11-07 MED ORDER — AMPHETAMINE-DEXTROAMPHET ER 20 MG PO CP24
40.0000 mg | ORAL_CAPSULE | Freq: Every morning | ORAL | 0 refills | Status: DC
  Filled 2022-11-07: qty 60, 30d supply, fill #0

## 2022-11-08 ENCOUNTER — Telehealth (INDEPENDENT_AMBULATORY_CARE_PROVIDER_SITE_OTHER): Payer: BC Managed Care – PPO | Admitting: Psychology

## 2022-11-08 ENCOUNTER — Other Ambulatory Visit (HOSPITAL_COMMUNITY): Payer: Self-pay

## 2022-11-08 DIAGNOSIS — F411 Generalized anxiety disorder: Secondary | ICD-10-CM

## 2022-11-08 DIAGNOSIS — F5089 Other specified eating disorder: Secondary | ICD-10-CM | POA: Diagnosis not present

## 2022-11-08 DIAGNOSIS — F331 Major depressive disorder, recurrent, moderate: Secondary | ICD-10-CM

## 2022-11-08 DIAGNOSIS — F909 Attention-deficit hyperactivity disorder, unspecified type: Secondary | ICD-10-CM

## 2022-11-08 NOTE — Progress Notes (Signed)
  Office: [phone]  /  Fax: [phone]    Date: November 08, 2022  Appointment Start Time: 11:29am Duration: 19 minutes Provider: Lawerance Cruel, Psy.D. Type of Session: Individual Therapy  Location of Patient: Home (private location) Location of Provider: Provider's Home (private office) Type of Contact: Telepsychological Visit via MyChart Video Visit  Session Content: Kathleen Odonnell is a 52 y.o. female presenting for a follow-up appointment to address the previously established treatment goal of increasing coping skills.Today's appointment was a telepsychological visit. Kathleen Odonnell provided verbal consent for today's telepsychological appointment and she is aware she is responsible for securing confidentiality on her end of the session. Prior to proceeding with today's appointment, Kathleen Odonnell's physical location at the time of this appointment was obtained as well a phone number she could be reached at in the event of technical difficulties. Kathleen Odonnell and this provider participated in today's telepsychological service.   This provider conducted a brief check-in. Kathleen Odonnell shared about recent events, including the passing of her cousin. She acknowledged engaging in emotional eating behaviors to cope. Reviewed emotional and physical hunger. Psychoeducation regarding the importance of self-care utilizing the oxygen mask metaphor was provided. Psychoeducation regarding pleasurable activities, including its impact on emotional eating and overall well-being was provided. Kathleen Odonnell was provided with a handout with various options of pleasurable activities, and was encouraged to engage in one activity a day and additional activities as needed when triggered to emotionally eat. Kathleen Odonnell agreed. Kathleen Odonnell provided verbal consent during today's appointment for this provider to send a handout about pleasurable activities via e-mail. Overall, Kathleen Odonnell was receptive to today's appointment as evidenced by openness to sharing, responsiveness to  feedback, and willingness to engage in pleasurable activities to assist with coping.  Mental Status Examination:  Appearance: neat Behavior: appropriate to circumstances Mood: sad Affect: mood congruent and tearful Speech: WNL Eye Contact: appropriate Psychomotor Activity: WNL Gait: unable to assess Thought Process: linear, logical, and goal directed and no evidence or endorsement of suicidal, homicidal, and self-harm ideation, plan and intent  Thought Content/Perception: no hallucinations, delusions, bizarre thinking or behavior endorsed or observed Orientation: AAOx4 Memory/Concentration: intact Insight: fair Judgment: fair  Interventions:  Conducted a brief chart review Provided empathic reflections and validation Reviewed content from the previous session Provided positive reinforcement Employed supportive psychotherapy interventions to facilitate reduced distress and to improve coping skills with identified stressors Psychoeducation provided regarding pleasurable activities Psychoeducation provided regarding self-care  DSM-5 Diagnosis(es):  F50.89 Other Specified Feeding or Eating Disorder, Emotional Eating Behaviors, F41.1 Generalized Anxiety Disorder, F33.1  Major Depressive Disorder, Recurrent Episode, Moderate, and F90.9 Unspecified Attention-Deficit/Hyperactivity Disorder  Treatment Goal & Progress: During the initial appointment with this provider, the following treatment goal was established: increase coping skills. Progress is limited, as Kathleen Odonnell has just begun treatment with this provider; however, she is receptive to the interaction and interventions and rapport is being established.   Plan: The next appointment is scheduled for 12/05/2022 at 4pm, which will be via MyChart Video Visit. The next session will focus on working towards the established treatment goal. Kathleen Odonnell will continue with her primary therapist and increase frequency of appointments.

## 2022-11-15 ENCOUNTER — Ambulatory Visit (INDEPENDENT_AMBULATORY_CARE_PROVIDER_SITE_OTHER): Payer: BC Managed Care – PPO | Admitting: Physician Assistant

## 2022-11-15 ENCOUNTER — Encounter (INDEPENDENT_AMBULATORY_CARE_PROVIDER_SITE_OTHER): Payer: Self-pay | Admitting: Physician Assistant

## 2022-11-15 VITALS — BP 121/78 | HR 91 | Temp 98.4°F | Ht 59.0 in | Wt 202.0 lb

## 2022-11-15 DIAGNOSIS — E669 Obesity, unspecified: Secondary | ICD-10-CM

## 2022-11-15 DIAGNOSIS — F411 Generalized anxiety disorder: Secondary | ICD-10-CM | POA: Diagnosis not present

## 2022-11-15 DIAGNOSIS — E7800 Pure hypercholesterolemia, unspecified: Secondary | ICD-10-CM

## 2022-11-15 DIAGNOSIS — F909 Attention-deficit hyperactivity disorder, unspecified type: Secondary | ICD-10-CM | POA: Insufficient documentation

## 2022-11-15 DIAGNOSIS — Z6841 Body Mass Index (BMI) 40.0 and over, adult: Secondary | ICD-10-CM | POA: Insufficient documentation

## 2022-11-15 DIAGNOSIS — R638 Other symptoms and signs concerning food and fluid intake: Secondary | ICD-10-CM | POA: Diagnosis not present

## 2022-11-15 MED ORDER — TOPIRAMATE 50 MG PO TABS
50.0000 mg | ORAL_TABLET | Freq: Two times a day (BID) | ORAL | 0 refills | Status: DC
Start: 2022-11-15 — End: 2022-12-19

## 2022-11-15 NOTE — Progress Notes (Signed)
SUBJECTIVE:  Chief Complaint: Obesity Discussed the use of AI scribe software for clinical note transcription with the patient, who gave verbal consent to proceed.  History of Present Illness  /   Interim History: Kathleen Odonnell who presents with a history of prediabetes, vitamin D deficiency, and abnormal food appetite, presents for a follow-up of her obesity treatment plan.  She had  been on phentermine and topiramate for obesity, but has not seen much results with phentermine.  The patient has also been on Wegovy or Ozempic in the past, which had helped her lose weight.  The patient has an elevated LDL cholesterol level.  The patient also has a family history of heart disease, with her grandmother having a heart attack in her sixties and requiring bypass surgery.  The patient also reports having panic attacks, for which she is on Xanax.  The patient also reports having a stressful work environment.  Kathleen Odonnell is here to discuss her progress with her obesity treatment plan. She is on the Category 2 Plan and states she is following her eating plan approximately 70 % of the time. She states she is exercising walking 1.5 miles 30 minutes 5 times per week.   Pharmacotherapy: Stopped phentermine and is now taking Topiramate 50 mg twice daily.( Off label use, single agent) and over the past 2 weeks is down 1 lb.  Previously on Wegovy/Ozempic until insurance coverage dropped.   OBJECTIVE: Visit Diagnoses: Problem List Items Addressed This Visit     Pure hypercholesterolemia - Primary   Generalized anxiety disorder   Obesity Start BMI 43.22   Abnormal food appetite   Relevant Medications   topiramate (TOPAMAX) 50 MG tablet   Attention deficit hyperactivity disorder (ADHD)   BMI 40.0-44.9, adult (HCC) Current BMI 40.8  Obesity with abnormal food appetite:  Slow weight loss despite adherence to dietary plan. Previously successful with Wegovy, but insurance coverage changed. Currently on  Topiramate with minimal effect. Noted evening cravings and binge eating, but did lose 1 lb over the past couple of weeks.  -Continue/Refill  Topiramate 50 mg BID. She is down 1 lb over the past 2 weeks.  Bio impedence scale reviewed with the patient:  Muscle mass + 1.8 lbs Adipose mass -3.2 lbs.  -Consider discussing Vyvanse ( currently on Adderall)  with psychiatrist for potential benefit in binge eating. -Continue dietary plan with portion control strategies, especially during holidays. -Consider walking within 30 minutes of eating to prevent blood sugar spike.  Hyperlipidemia Elevated LDL cholesterol. Family history of cardiac disease. -Consider discussing with primary care provider about the need for a coronary calcium score test due to family history. -Consider cholesterol-lowering medication. Continue to work on nutrition plan -decreasing simple carbohydrates, increasing lean proteins, decreasing saturated fats and cholesterol , avoiding trans fats and exercise as able to promote weight loss, improve lipids and decrease cardiovascular risks.   Anxiety and Panic Attacks/ Attention deficit disorder:  Increased frequency of panic attacks, particularly in work environment. Currently on Xanax and Adderall. -Continue current medications. -Consider discussing with psychiatrist about potential addition of Vyvanse ( instead of Adderall)  for dual benefit for history of ADD and for possible binge eating disorder.   Vitals Temp: 98.4 F (36.9 C) BP: 121/78 Pulse Rate: 91 SpO2: 100 %   Anthropometric Measurements Height: 4\' 11"  (1.499 m) Weight: 202 lb (91.6 kg) BMI (Calculated): 40.78 Weight at Last Visit: 203 lb Weight Lost Since Last Visit: 1 lb Weight Gained Since Last Visit: 0  Starting Weight: 214 lb Total Weight Loss (lbs): 12 lb (5.443 kg) Peak Weight: 228 lb   Body Composition  Body Fat %: 47.6 % Fat Mass (lbs): 96.2 lbs Muscle Mass (lbs): 100.6 lbs Total Body Water  (lbs): 76.4 lbs Visceral Fat Rating : 15   Other Clinical Data Fasting: no Labs: no Today's Visit #: 25 Starting Date: 03/05/21     ASSESSMENT AND PLAN:  Diet: Kathleen Odonnell is currently in the action stage of change. As such, her goal is to continue with weight loss efforts. She has agreed to Category 2 Plan.  Exercise: Kathleen Odonnell has been instructed to work up to a goal of 150 minutes of combined cardio and strengthening exercise per week for weight loss and overall health benefits.   Behavior Modification:  We discussed the following Behavioral Modification Strategies today: increasing lean protein intake, decreasing simple carbohydrates, increasing vegetables, increase H2O intake, increase high fiber foods, no skipping meals, meal planning and cooking strategies, ways to avoid nighttime snacking, emotional eating strategies , and holiday eating strategies. We discussed various medication options to help Kathleen Odonnell with her weight loss efforts and we both agreed to continue topamax 50 mg twice daily for cravings/emotional eating behaviors.  Return in about 4 weeks (around 12/13/2022).[name] She was informed of the importance of frequent follow up visits to maximize her success with intensive lifestyle modifications for her multiple health conditions.  Attestation Statements:   Reviewed by clinician on day of visit: allergies, medications, problem list, medical history, surgical history, family history, social history, and previous encounter notes.   Time spent on visit including pre-visit chart review and post-visit care and charting was 45 minutes.    Sugey Trevathan, PA-C

## 2022-12-05 ENCOUNTER — Telehealth (INDEPENDENT_AMBULATORY_CARE_PROVIDER_SITE_OTHER): Payer: BC Managed Care – PPO | Admitting: Psychology

## 2022-12-05 DIAGNOSIS — F411 Generalized anxiety disorder: Secondary | ICD-10-CM | POA: Diagnosis not present

## 2022-12-05 DIAGNOSIS — F5089 Other specified eating disorder: Secondary | ICD-10-CM

## 2022-12-05 DIAGNOSIS — F909 Attention-deficit hyperactivity disorder, unspecified type: Secondary | ICD-10-CM | POA: Diagnosis not present

## 2022-12-05 DIAGNOSIS — F331 Major depressive disorder, recurrent, moderate: Secondary | ICD-10-CM

## 2022-12-05 NOTE — Progress Notes (Signed)
  Office: [phone]  /  Fax: [phone]    Date: December 05, 2022  Appointment Start Time: 4:04pm Duration: 24 minutes Provider: Lawerance Cruel, Psy.D. Type of Session: Individual Therapy  Location of Patient: Work (private location) Location of Provider: [employer] (private office) Type of Contact: Telepsychological Visit via MyChart Video Visit  Session Content: This provider called Cheryn at 4:03pm as she did not present for today's appointment. Assistance was provided. As such, today's appointment was initiated 4 minutes late.Kathleen Odonnell is a 52 y.o. female presenting for a follow-up appointment to address the previously established treatment goal of increasing coping skills.Today's appointment was a telepsychological visit. [name] provided verbal consent for today's telepsychological appointment and she is aware she is responsible for securing confidentiality on her end of the session. Prior to proceeding with today's appointment, [name]'s physical location at the time of this appointment was obtained as well a phone number she could be reached at in the event of technical difficulties. [name] and this provider participated in today's telepsychological service.   This provider conducted a brief check-in. Harmoney discussed an increase in her Adderall rx, which has resulted in decreased appetite. She was receptive to setting reminders on her phone to eat. Reviewed self-care/pleasurable activities. Positive reinforcement was provided. Psychoeducation regarding triggers for emotional eating was provided. [name] was provided a handout, and encouraged to utilize the handout between now and the next appointment to increase awareness of triggers and frequency. [name] agreed. This provider also discussed behavioral strategies for specific triggers, such as placing the utensil down when conversing to avoid mindless eating. Evianna provided verbal consent during today's appointment for this provider to send a  handout about triggers via e-mail. Overall, [name] was receptive to today's appointment as evidenced by openness to sharing, responsiveness to feedback, and willingness to explore triggers for emotional eating.  Mental Status Examination:  Appearance: neat Behavior: appropriate to circumstances Mood: neutral Affect: mood congruent and tearful Speech: WNL Eye Contact: appropriate Psychomotor Activity: WNL Gait: unable to assess Thought Process: linear, logical, and goal directed and no evidence or endorsement of suicidal, homicidal, and self-harm ideation, plan and intent  Thought Content/Perception: no hallucinations, delusions, bizarre thinking or behavior endorsed or observed Orientation: AAOx4 Memory/Concentration: intact Insight: fair Judgment: fair  Interventions:  Conducted a brief chart review Provided empathic reflections and validation Reviewed content from the previous session Provided positive reinforcement Employed supportive psychotherapy interventions to facilitate reduced distress and to improve coping skills with identified stressors Psychoeducation provided regarding triggers for emotional eating behaviors  DSM-5 Diagnosis(es):  F50.89 Other Specified Feeding or Eating Disorder, Emotional Eating Behaviors, F41.1 Generalized Anxiety Disorder, F33.1  Major Depressive Disorder, Recurrent Episode, Moderate, and F90.9 Unspecified Attention-Deficit/Hyperactivity Disorder  Treatment Goal & Progress: During the initial appointment with this provider, the following treatment goal was established: increase coping skills. [name] has demonstrated progress in her goal as evidenced by increased awareness of hunger patterns. [name] also continues to demonstrate willingness to engage in learned skill(s).  Plan: The next appointment is scheduled for 12/26/2022 at 11am, which will be via MyChart Video Visit. The next session will focus on working towards the established treatment goal.  [name] is initiating services with a primary therapist tomorrow.

## 2022-12-07 ENCOUNTER — Other Ambulatory Visit (HOSPITAL_COMMUNITY): Payer: Self-pay

## 2022-12-07 MED ORDER — AMPHETAMINE-DEXTROAMPHET ER 30 MG PO CP24
60.0000 mg | ORAL_CAPSULE | Freq: Every morning | ORAL | 0 refills | Status: DC
  Filled 2022-12-07: qty 60, 30d supply, fill #0

## 2022-12-08 ENCOUNTER — Other Ambulatory Visit (HOSPITAL_COMMUNITY): Payer: Self-pay

## 2022-12-19 ENCOUNTER — Encounter (INDEPENDENT_AMBULATORY_CARE_PROVIDER_SITE_OTHER): Payer: Self-pay | Admitting: Internal Medicine

## 2022-12-19 ENCOUNTER — Ambulatory Visit (INDEPENDENT_AMBULATORY_CARE_PROVIDER_SITE_OTHER): Payer: BC Managed Care – PPO | Admitting: Internal Medicine

## 2022-12-19 ENCOUNTER — Other Ambulatory Visit (INDEPENDENT_AMBULATORY_CARE_PROVIDER_SITE_OTHER): Payer: Self-pay | Admitting: Internal Medicine

## 2022-12-19 VITALS — BP 126/75 | HR 86 | Temp 98.1°F | Ht 59.0 in | Wt 206.0 lb

## 2022-12-19 DIAGNOSIS — R638 Other symptoms and signs concerning food and fluid intake: Secondary | ICD-10-CM

## 2022-12-19 DIAGNOSIS — R7303 Prediabetes: Secondary | ICD-10-CM | POA: Diagnosis not present

## 2022-12-19 DIAGNOSIS — E66813 Obesity, class 3: Secondary | ICD-10-CM

## 2022-12-19 DIAGNOSIS — Z6841 Body Mass Index (BMI) 40.0 and over, adult: Secondary | ICD-10-CM

## 2022-12-19 DIAGNOSIS — Z723 Lack of physical exercise: Secondary | ICD-10-CM | POA: Insufficient documentation

## 2022-12-19 MED ORDER — TOPIRAMATE 50 MG PO TABS: 50.0000 mg | ORAL_TABLET | Freq: Two times a day (BID) | ORAL | 0 refills | Status: DC

## 2022-12-19 MED ORDER — TOPIRAMATE 25 MG PO TABS
25.0000 mg | ORAL_TABLET | Freq: Two times a day (BID) | ORAL | 0 refills | Status: DC
Start: 2022-12-19 — End: 2023-01-24

## 2022-12-19 NOTE — Assessment & Plan Note (Addendum)
Patient has had substantial weight regain after discontinuation of GLP-1 likely due to maladaptation following discontinuation of therapy with increased orexigenic signaling and possibly underlying eating disorder.  She will continue to follow-up with psychologist Dr. Dewaine Conger.  We are increasing topiramate to 75 mg twice daily.  Her phentermine has been discontinued due to the presence of Adderall now which was prescribed by psychiatry for attention deficit.  She will work on reimplementing reduced calorie nutrition plan and begin regular exercise.  We discussed the benefits of regular physical activity as a relates to her mental health.  Patient will have new insurance in January and will look into coverage for GLP-1 although this time around she needs to count of understanding that this is a long-term treatment and as recently experience she will be prone to weight regain after discontinuation of treatment.

## 2022-12-19 NOTE — Assessment & Plan Note (Signed)
Patient has had substantial weight regain after discontinuation of GLP-1 likely due to maladaptation following discontinuation of therapy with increased orexigenic signaling and possibly underlying eating disorder.  She will continue to follow-up with psychologist Dr. Dewaine Conger.  We are increasing topiramate to 75 mg twice daily.  Her phentermine has been discontinued due to the presence of Adderall now which was prescribed by psychiatry for attention deficit.  She will work on reimplementing reduced calorie nutrition plan and begin regular exercise.  We discussed the benefits of regular physical activity as a relates to her mental health.

## 2022-12-19 NOTE — Assessment & Plan Note (Signed)
We discussed the benefits of regular physical activity.  She will work on restarting exercise on her next upcoming vacation.

## 2022-12-19 NOTE — Assessment & Plan Note (Addendum)
Her hemoglobin A1c is trending upward and at 5.6 from 5.2 this was after discontinuation of Wegovy.  She may be a candidate for metformin pharmacoprophylaxis but we will avoid at present time as she is on topiramate in combination may increase risk of metabolic acidosis.  She will continue to work on sugar control and reducing simple and added sugars in her diet.  We will check disease monitoring labs at the next office visit.

## 2022-12-19 NOTE — Progress Notes (Signed)
Office: [phone]  /  Fax: [phone]  Weight Summary And Biometrics  Vitals Temp: 98.1 F (36.7 C) BP: 126/75 Pulse Rate: 86 SpO2: 100 %   Anthropometric Measurements Height: 4\' 11"  (1.499 m) Weight: 206 lb (93.4 kg) BMI (Calculated): 41.58 Weight at Last Visit: 202 lb Weight Lost Since Last Visit: 0 lb Weight Gained Since Last Visit: 4 lb Starting Weight: 214 lb Total Weight Loss (lbs): 8 lb (3.629 kg) Peak Weight: 225 lb   Body Composition  Body Fat %: 48.5 % Fat Mass (lbs): 100.2 lbs Muscle Mass (lbs): 101.2 lbs Total Body Water (lbs): 77.6 lbs Visceral Fat Rating : 15    No data recorded Today's Visit #: 26  Starting Date: 03/05/21   Subjective   Chief Complaint: Obesity  Kathleen Odonnell is here to discuss her progress with her obesity treatment plan. She is on the the Category 2 Plan and states she is following her eating plan approximately 30 % of the time. She states she is not exercising.  Interval History:   Since last office visit she has gained 4 pounds. She reports suboptimal adherence She has been working on increasing protein intake at every meal, getting back on track following recent lapse, and controlling orexigenic cues and stimuli .  She is seen Dr. Dewaine Conger for eating disorder.  She is currently on topiramate 50 mg twice a day without any adverse effects.  Her phentermine was recently discontinued as patient was restarted on her Adderall.  She reports having high levels of anxiety and is followed by psychiatry.  No changes in her psychotropic medication.  She has not been exercising  Orexigenic Control:  Reports problems with appetite and hunger signals.  Denies problems with satiety and satiation.  Denies problems with eating patterns and portion control.  Denies abnormal cravings. Denies feeling deprived or restricted.   Barriers identified: cost of medication, strong hunger signals and impaired satiety / inhibitory control, low volume of  physical activity at present , and work schedule.   Pharmacotherapy for weight loss: She is currently taking Topiramate (off label use, single agent) without clinical response and without side effects..   Assessment and Plan   Treatment Plan For Obesity:  Recommended Dietary Goals  Kathleen Odonnell is currently in the action stage of change. As such, her goal is to continue weight management plan. She has agreed to: portion control, balanced plate and making smarter food choices, such as increasing vegetables, protein intake and reducing simple carbohydrates and processed foods   Behavioral Intervention  We discussed the following Behavioral Modification Strategies today: increasing lean protein intake to established goals, decreasing simple carbohydrates , increasing vegetables, increasing fiber rich foods, avoiding skipping meals, increasing water intake , work on meal planning and preparation, work on managing stress, creating time for self-care and relaxation, avoiding temptations and identifying enticing environmental cues, and continue to work on implementation of reduced calorie nutritional plan.  Follow-up with behavioral health specialist for anxiety and eating disorder.  Additional resources provided today: None  Recommended Physical Activity Goals  Kathleen Odonnell has been advised to work up to 150 minutes of moderate intensity aerobic activity a week and strengthening exercises 2-3 times per week for cardiovascular health, weight loss maintenance and preservation of muscle mass.   She has agreed to :  Think about enjoyable ways to increase daily physical activity and overcoming barriers to exercise and Increase physical activity in their day and reduce sedentary time (increase NEAT).  Pharmacotherapy  We discussed various medication  options to help Kathleen Odonnell with her weight loss efforts and we both agreed to : increase topiramate to 75 mg twice a day if we did not notice a clinical effect medication  will be discontinued.  She will look into coverage for GLP-1 in January as her insurance is switching.  Associated Conditions Addressed Today  Class 3 severe obesity with serious comorbidity and body mass index (BMI) of 40.0 to 44.9 in adult, unspecified obesity type [facility]) Assessment & Plan: Patient has had substantial weight regain after discontinuation of GLP-1 likely due to maladaptation following discontinuation of therapy with increased orexigenic signaling and possibly underlying eating disorder.  She will continue to follow-up with psychologist Dr. Dewaine Conger.  We are increasing topiramate to 75 mg twice daily.  Her phentermine has been discontinued due to the presence of Adderall now which was prescribed by psychiatry for attention deficit.  She will work on reimplementing reduced calorie nutrition plan and begin regular exercise.  We discussed the benefits of regular physical activity as a relates to her mental health.  Orders: -     Topiramate; Take 1 tablet (50 mg total) by mouth 2 (two) times daily.  Dispense: 60 tablet; Refill: 0 -     Topiramate; Take 1 tablet (25 mg total) by mouth 2 (two) times daily.  Dispense: 60 tablet; Refill: 0  Abnormal food appetite Assessment & Plan: Patient has had substantial weight regain after discontinuation of GLP-1 likely due to maladaptation following discontinuation of therapy with increased orexigenic signaling and possibly underlying eating disorder.  She will continue to follow-up with psychologist Dr. Dewaine Conger.  We are increasing topiramate to 75 mg twice daily.  Her phentermine has been discontinued due to the presence of Adderall now which was prescribed by psychiatry for attention deficit.  She will work on reimplementing reduced calorie nutrition plan and begin regular exercise.  We discussed the benefits of regular physical activity as a relates to her mental health.  Patient will have new insurance in January and will look into coverage for GLP-1  although this time around she needs to count of understanding that this is a long-term treatment and as recently experience she will be prone to weight regain after discontinuation of treatment.  Orders: -     Topiramate; Take 1 tablet (50 mg total) by mouth 2 (two) times daily.  Dispense: 60 tablet; Refill: 0 -     Topiramate; Take 1 tablet (25 mg total) by mouth 2 (two) times daily.  Dispense: 60 tablet; Refill: 0  Physically inactive Assessment & Plan: We discussed the benefits of regular physical activity.  She will work on restarting exercise on her next upcoming vacation.   Prediabetes Assessment & Plan: Her hemoglobin A1c is trending upward and at 5.6 from 5.2 this was after discontinuation of Wegovy.  She may be a candidate for metformin pharmacoprophylaxis but we will avoid at present time as she is on topiramate in combination may increase risk of metabolic acidosis.  She will continue to work on sugar control and reducing simple and added sugars in her diet.  We will check disease monitoring labs at the next office visit.      Objective   Physical Exam:  Blood pressure 126/75, pulse 86, temperature 98.1 F (36.7 C), height 4\' 11"  (1.499 m), weight 206 lb (93.4 kg), SpO2 100%. Body mass index is 41.61 kg/m.  General: She is overweight, cooperative, alert, well developed, and in no acute distress. PSYCH: Has normal mood, affect and thought  process.   HEENT: EOMI, sclerae are anicteric. Lungs: Normal breathing effort, no conversational dyspnea. Extremities: No edema.  Neurologic: No gross sensory or motor deficits. No tremors or fasciculations noted.    Diagnostic Data Reviewed:  BMET    Component Value Date/Time   NA 142 04/21/2022 0917   K 4.0 04/21/2022 0917   CL 103 04/21/2022 0917   CO2 21 04/21/2022 0917   GLUCOSE 79 04/21/2022 0917   BUN 10 04/21/2022 0917   CREATININE 0.71 04/21/2022 0917   CALCIUM 8.6 (L) 04/21/2022 0917   Lab Results  Component  Value Date   HGBA1C 5.6 08/25/2022   HGBA1C 5.8 (H) 03/04/2021   Lab Results  Component Value Date   INSULIN 4.6 04/21/2022   INSULIN 11.4 03/04/2021   Lab Results  Component Value Date   TSH 1.860 03/04/2021   CBC    Component Value Date/Time   WBC 8.5 11/08/2021 0803   WBC 12.6 (H) 06/28/2007 0550   RBC 4.40 11/08/2021 0803   RBC 3.09 (L) 06/28/2007 0550   HGB 12.1 11/08/2021 0803   HCT 36.3 11/08/2021 0803   PLT 201 06/28/2007 0550   MCV 83 11/08/2021 0803   MCH 27.5 11/08/2021 0803   MCHC 33.3 11/08/2021 0803   MCHC 33.8 06/28/2007 0550   RDW 15.6 (H) 11/08/2021 0803   Iron Studies    Component Value Date/Time   IRON 59 11/08/2021 0803   TIBC 294 11/08/2021 0803   FERRITIN 78 11/08/2021 0803   IRONPCTSAT 20 11/08/2021 0803   Lipid Panel     Component Value Date/Time   CHOL 184 04/21/2022 0917   TRIG 70 04/21/2022 0917   HDL 51 04/21/2022 0917   LDLCALC 120 (H) 04/21/2022 0917   Hepatic Function Panel     Component Value Date/Time   PROT 7.1 04/21/2022 0917   ALBUMIN 3.9 04/21/2022 0917   AST 15 04/21/2022 0917   ALT 8 04/21/2022 0917   ALKPHOS 80 04/21/2022 0917   BILITOT <0.2 04/21/2022 0917      Component Value Date/Time   TSH 1.860 03/04/2021 0953   Nutritional Lab Results  Component Value Date   VD25OH 47.5 04/21/2022   VD25OH 43.6 03/04/2021    Follow-Up   Return in about 4 weeks (around 01/16/2023) for For Weight Mangement with Dr. Rikki Spearing.[name] She was informed of the importance of frequent follow up visits to maximize her success with intensive lifestyle modifications for her multiple health conditions.  Attestation Statement   Reviewed by clinician on day of visit: allergies, medications, problem list, medical history, surgical history, family history, social history, and previous encounter notes.     Worthy Rancher, MD

## 2022-12-22 ENCOUNTER — Telehealth (HOSPITAL_COMMUNITY): Payer: BC Managed Care – PPO | Admitting: Psychiatry

## 2022-12-22 ENCOUNTER — Encounter (HOSPITAL_COMMUNITY): Payer: Self-pay | Admitting: Psychiatry

## 2022-12-22 VITALS — Wt 206.0 lb

## 2022-12-22 DIAGNOSIS — F331 Major depressive disorder, recurrent, moderate: Secondary | ICD-10-CM

## 2022-12-22 DIAGNOSIS — F4310 Post-traumatic stress disorder, unspecified: Secondary | ICD-10-CM

## 2022-12-22 DIAGNOSIS — F902 Attention-deficit hyperactivity disorder, combined type: Secondary | ICD-10-CM

## 2022-12-22 MED ORDER — TRAZODONE HCL 150 MG PO TABS
150.0000 mg | ORAL_TABLET | Freq: Every day | ORAL | 2 refills | Status: DC
Start: 2022-12-22 — End: 2023-03-28

## 2022-12-22 MED ORDER — BUPROPION HCL ER (XL) 150 MG PO TB24
ORAL_TABLET | ORAL | 0 refills | Status: DC
Start: 2022-12-22 — End: 2023-01-20
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
## 2022-12-26 ENCOUNTER — Encounter (INDEPENDENT_AMBULATORY_CARE_PROVIDER_SITE_OTHER): Payer: BC Managed Care – PPO | Admitting: Psychology

## 2022-12-26 ENCOUNTER — Encounter (INDEPENDENT_AMBULATORY_CARE_PROVIDER_SITE_OTHER): Payer: Self-pay

## 2022-12-26 ENCOUNTER — Telehealth (INDEPENDENT_AMBULATORY_CARE_PROVIDER_SITE_OTHER): Payer: Self-pay | Admitting: Psychology

## 2022-12-26 NOTE — Progress Notes (Signed)
Entered in error

## 2022-12-26 NOTE — Telephone Encounter (Signed)
  Office: [phone]  /  Fax: [phone]  Date of Encounter: December 26, 2022  Time of Encounter: 11:01am Duration of Encounter: ~3 minute(s) Provider: Lawerance Cruel, PsyD  CONTENT:  Kathleen Odonnell presented for today's appointment via MyChart Video visit, but disclosed she was in [place]. She acknowledged understanding that today's appointment would have to be rescheduled due to her being out of state. No evidence or endorsement of safety concerns. All questions/concerns addressed.   PLAN: [name] is scheduled for an appointment on 01/09/2023 at 9:30am.

## 2023-01-09 ENCOUNTER — Telehealth (INDEPENDENT_AMBULATORY_CARE_PROVIDER_SITE_OTHER): Payer: Self-pay | Admitting: Psychology

## 2023-01-20 ENCOUNTER — Telehealth (HOSPITAL_BASED_OUTPATIENT_CLINIC_OR_DEPARTMENT_OTHER): Payer: 59 | Admitting: Psychiatry

## 2023-01-20 ENCOUNTER — Encounter (HOSPITAL_COMMUNITY): Payer: Self-pay | Admitting: Psychiatry

## 2023-01-20 VITALS — Wt 206.0 lb

## 2023-01-20 DIAGNOSIS — F331 Major depressive disorder, recurrent, moderate: Secondary | ICD-10-CM | POA: Diagnosis not present

## 2023-01-20 DIAGNOSIS — Z635 Disruption of family by separation and divorce: Secondary | ICD-10-CM | POA: Diagnosis not present

## 2023-01-20 DIAGNOSIS — F902 Attention-deficit hyperactivity disorder, combined type: Secondary | ICD-10-CM

## 2023-01-20 DIAGNOSIS — F4310 Post-traumatic stress disorder, unspecified: Secondary | ICD-10-CM

## 2023-01-20 MED ORDER — BUPROPION HCL ER (XL) 300 MG PO TB24: 300.0000 mg | ORAL_TABLET | Freq: Every day | ORAL | 1 refills | Status: DC
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
## 2023-01-24 ENCOUNTER — Ambulatory Visit (INDEPENDENT_AMBULATORY_CARE_PROVIDER_SITE_OTHER): Payer: 59 | Admitting: Internal Medicine

## 2023-01-24 ENCOUNTER — Other Ambulatory Visit (INDEPENDENT_AMBULATORY_CARE_PROVIDER_SITE_OTHER): Payer: Self-pay | Admitting: Internal Medicine

## 2023-01-24 ENCOUNTER — Encounter (INDEPENDENT_AMBULATORY_CARE_PROVIDER_SITE_OTHER): Payer: Self-pay | Admitting: Internal Medicine

## 2023-01-24 VITALS — BP 124/81 | HR 96 | Temp 98.2°F | Ht 59.0 in | Wt 200.0 lb

## 2023-01-24 DIAGNOSIS — R638 Other symptoms and signs concerning food and fluid intake: Secondary | ICD-10-CM

## 2023-01-24 DIAGNOSIS — Z6841 Body Mass Index (BMI) 40.0 and over, adult: Secondary | ICD-10-CM

## 2023-01-24 DIAGNOSIS — E66813 Obesity, class 3: Secondary | ICD-10-CM | POA: Diagnosis not present

## 2023-01-24 DIAGNOSIS — F900 Attention-deficit hyperactivity disorder, predominantly inattentive type: Secondary | ICD-10-CM

## 2023-01-24 DIAGNOSIS — R7303 Prediabetes: Secondary | ICD-10-CM

## 2023-01-24 MED ORDER — TOPIRAMATE 25 MG PO TABS: 25.0000 mg | ORAL_TABLET | Freq: Two times a day (BID) | ORAL | 0 refills | Status: DC

## 2023-01-24 MED ORDER — TOPIRAMATE 50 MG PO TABS: 50.0000 mg | ORAL_TABLET | Freq: Two times a day (BID) | ORAL | 0 refills | Status: DC

## 2023-01-24 NOTE — Progress Notes (Signed)
Office: [phone]  /  Fax: [phone]  Weight Summary And Biometrics  Vitals Temp: 98.2 F (36.8 C) BP: 124/81 Pulse Rate: 96 SpO2: 98 %   Anthropometric Measurements Height: 4\' 11"  (1.499 m) Weight: 200 lb (90.7 kg) BMI (Calculated): 40.37 Weight at Last Visit: 206 lb Weight Lost Since Last Visit: 6 lb Weight Gained Since Last Visit: 0 lb Starting Weight: 214 lb Total Weight Loss (lbs): 14 lb (6.35 kg) Peak Weight: 225 lb   Body Composition  Body Fat %: 47 % Fat Mass (lbs): 94.2 lbs Muscle Mass (lbs): 100.8 lbs Total Body Water (lbs): 72.2 lbs Visceral Fat Rating : 14    No data recorded Today's Visit #: 27  Starting Date: 03/05/21   Subjective   Chief Complaint: Obesity  Shanera is here to discuss her progress with her obesity treatment plan. She is on the the Category 2 Plan and states she is following her eating plan approximately 60 % of the time. She states she is not exercising.  Weight Progress Since Last Visit:  Discussed the use of AI scribe software for clinical note transcription with the patient, who gave verbal consent to proceed.  History of Present Illness   The patient, affected by obesity, prediabetes, abnormal food appetite, and attention deficit disorder, presents for a follow-up visit for medical weight management. She reports adherence to her reduced calorie nutrition plan about sixty percent of the time and is currently not exercising. Despite these challenges, she has lost six pounds since her last visit.  The patient's appetite and food cravings have improved since the increase in her topiramate dosage to seventy five milligrams twice a day. She reports feeling full faster and no longer experiencing intense food cravings.  The patient is currently undergoing a stressful life event, a separation from her partner, which has affected her motivation to exercise. Despite this, she is determined to focus on her health and  well-being.  The patient's ADHD medication was recently changed to Wellbutrin by her psychiatrist. She reports no side effects from the topiramate or the Wellbutrin.  The patient acknowledges the need to increase her physical activity and improve her water intake. She is open to suggestions and is considering using flavored water or fruit-infused water to increase her hydration.  The patient is also considering the use of metformin for diabetes prevention and weight management, but is currently doing well on the higher dose of topiramate.        Patient Reported Barriers to Progress: work schedule, strong hunger signals and/or impaired satiety / inhibitory control, and low volume of physical activity at present .   Orexigenic Control: Reports improved problems with appetite and hunger signals.  Reports improved problems with satiety and satiation.  Denies problems with eating patterns and portion control.  Reports improved abnormal cravings. Denies feeling deprived or restricted.   Pharmacotherapy for weight management: She is currently taking Bupropion (single agent, off label use) with adequate clinical response  and without side effects. and Topiramate (off label use, single agent) with adequate clinical response  and without side effects..   Assessment and Plan   Treatment Plan For Obesity:  Recommended Dietary Goals  Hadie is currently in the action stage of change. As such, her goal is to continue weight management plan. She has agreed to: switch to AI generated 7-day meal plan targeting 1100 cal high in protein  Behavioral Health and Counseling  We discussed the following behavioral modification strategies today: continue to work on  maintaining a reduced calorie state, getting the recommended amount of protein, incorporating whole foods, making healthy choices, staying well hydrated and practicing mindfulness when eating..  Additional education and resources provided  today: None  Recommended Physical Activity Goals  Rhilyn has been advised to work up to 150 minutes of moderate intensity aerobic activity a week and strengthening exercises 2-3 times per week for cardiovascular health, weight loss maintenance and preservation of muscle mass.   She has agreed to :  Think about enjoyable ways to increase daily physical activity and overcoming barriers to exercise and Increase physical activity in their day and reduce sedentary time (increase NEAT).  Pharmacotherapy  We discussed various medication options to help Natia with her weight loss efforts and we both agreed to : continue current anti-obesity medication regimen and now that she is no longer on Adderall we could consider adding phentermine if needed.  We will also consider metformin.  She does not have access to GLP-1  Associated Conditions Impacted by Obesity Treatment  Prediabetes  Abnormal food appetite -     Topiramate; Take 1 tablet (50 mg total) by mouth 2 (two) times daily.  Dispense: 60 tablet; Refill: 0 -     Topiramate; Take 1 tablet (25 mg total) by mouth 2 (two) times daily.  Dispense: 60 tablet; Refill: 0  Class 3 severe obesity with serious comorbidity and body mass index (BMI) of 40.0 to 44.9 in adult, unspecified obesity type (HCC) -     Topiramate; Take 1 tablet (50 mg total) by mouth 2 (two) times daily.  Dispense: 60 tablet; Refill: 0 -     Topiramate; Take 1 tablet (25 mg total) by mouth 2 (two) times daily.  Dispense: 60 tablet; Refill: 0  Attention deficit hyperactivity disorder (ADHD), predominantly inattentive type    Assessment and Plan    Obesity Lost six pounds since the last visit. Reports improved appetite control and reduced cravings with increased topiramate (75 mg twice a day). Not currently exercising due to stress from recent separation. Preserving muscle mass and losing body fat, with a reduction in visceral fat rating. Discussed the importance of physical  activity for weight loss, mood, and overall health. Provided a seven-day meal plan targeting 1100 calories per day using AI tools like ChatGPT for meal planning and motivation. - Continue topiramate 75 mg twice a day - Initiate physical activity, starting with one session per week - Provide a seven-day meal plan targeting 1100-1200 calories per day - Send prescription refills to [facility]  Abnormal Food Appetite Reports improved appetite control and reduced cravings with increased topiramate. Using protein shakes and consuming lean meats like chicken and pork chops. Discussed using AI for snack ideas and meal planning.  I had referred her to Dr. Dewaine Conger previously for cognitive behavioral therapy, appointment was never scheduled.  She feels she is doing better now and would like to hold off referral. - Continue current dietary practices - Encourage high-protein intake - Use AI for snack ideas and meal planning  Prediabetes Recent A1c of 5.6. Discussed potential benefits of metformin for diabetes prevention and weight management. Explained that metformin increases GLP-1, changes gut bacteria to healthier bacteria, and helps the body absorb less sugar, reducing cravings for sweets. Decided not to add metformin at this time to keep the treatment plan simpler given current progress with topiramate. - Monitor A1c levels - Consider metformin in the future if needed  Attention Deficit Disorder Psychiatrist changed ADHD medication from Adderall to Wellbutrin 300  mg daily due to lack of efficacy with Adderall. Currently off Adderall. - Continue Wellbutrin 300 mg daily  General Health Maintenance Discussed importance of hydration and provided suggestions for increasing water intake, including seltzer waters, fruit-infused waters, and low-calorie drinks like Poppy. Advised to avoid artificial sweeteners and sugary drinks. - Encourage intake of 90 ounces of water per day - Avoid artificial sweeteners and  sugary drinks  Follow-up - Follow up as needed based on progress and any new symptoms.       Objective   Physical Exam:  Blood pressure 124/81, pulse 96, temperature 98.2 F (36.8 C), height 4\' 11"  (1.499 m), weight 200 lb (90.7 kg), SpO2 98%. Body mass index is 40.4 kg/m.  General: She is overweight, cooperative, alert, well developed, and in no acute distress. PSYCH: Has normal mood, affect and thought process.   HEENT: EOMI, sclerae are anicteric. Lungs: Normal breathing effort, no conversational dyspnea. Extremities: No edema.  Neurologic: No gross sensory or motor deficits. No tremors or fasciculations noted.    Diagnostic Data Reviewed:  BMET    Component Value Date/Time   NA 142 04/21/2022 0917   K 4.0 04/21/2022 0917   CL 103 04/21/2022 0917   CO2 21 04/21/2022 0917   GLUCOSE 79 04/21/2022 0917   BUN 10 04/21/2022 0917   CREATININE 0.71 04/21/2022 0917   CALCIUM 8.6 (L) 04/21/2022 0917   Lab Results  Component Value Date   HGBA1C 5.6 08/25/2022   HGBA1C 5.8 (H) 03/04/2021   Lab Results  Component Value Date   INSULIN 4.6 04/21/2022   INSULIN 11.4 03/04/2021   Lab Results  Component Value Date   TSH 1.860 03/04/2021   CBC    Component Value Date/Time   WBC 8.5 11/08/2021 0803   WBC 12.6 (H) 06/28/2007 0550   RBC 4.40 11/08/2021 0803   RBC 3.09 (L) 06/28/2007 0550   HGB 12.1 11/08/2021 0803   HCT 36.3 11/08/2021 0803   PLT 201 06/28/2007 0550   MCV 83 11/08/2021 0803   MCH 27.5 11/08/2021 0803   MCHC 33.3 11/08/2021 0803   MCHC 33.8 06/28/2007 0550   RDW 15.6 (H) 11/08/2021 0803   Iron Studies    Component Value Date/Time   IRON 59 11/08/2021 0803   TIBC 294 11/08/2021 0803   FERRITIN 78 11/08/2021 0803   IRONPCTSAT 20 11/08/2021 0803   Lipid Panel     Component Value Date/Time   CHOL 184 04/21/2022 0917   TRIG 70 04/21/2022 0917   HDL 51 04/21/2022 0917   LDLCALC 120 (H) 04/21/2022 0917   Hepatic Function Panel     Component  Value Date/Time   PROT 7.1 04/21/2022 0917   ALBUMIN 3.9 04/21/2022 0917   AST 15 04/21/2022 0917   ALT 8 04/21/2022 0917   ALKPHOS 80 04/21/2022 0917   BILITOT <0.2 04/21/2022 0917      Component Value Date/Time   TSH 1.860 03/04/2021 0953   Nutritional Lab Results  Component Value Date   VD25OH 47.5 04/21/2022   VD25OH 43.6 03/04/2021    Follow-Up   Return in about 4 weeks (around 02/21/2023) for For Weight Mangement with Dr. Rikki Spearing.[name] She was informed of the importance of frequent follow up visits to maximize her success with intensive lifestyle modifications for her multiple health conditions.  Attestation Statement   Reviewed by clinician on day of visit: allergies, medications, problem list, medical history, surgical history, family history, social history, and previous encounter notes.  Worthy Rancher, MD

## 2023-02-22 ENCOUNTER — Encounter (HOSPITAL_COMMUNITY): Payer: Self-pay

## 2023-02-23 ENCOUNTER — Ambulatory Visit (INDEPENDENT_AMBULATORY_CARE_PROVIDER_SITE_OTHER): Payer: 59 | Admitting: Internal Medicine

## 2023-02-23 ENCOUNTER — Encounter (INDEPENDENT_AMBULATORY_CARE_PROVIDER_SITE_OTHER): Payer: Self-pay | Admitting: Internal Medicine

## 2023-02-23 VITALS — BP 120/70 | HR 85 | Temp 98.0°F | Ht 59.0 in | Wt 201.0 lb

## 2023-02-23 DIAGNOSIS — R7303 Prediabetes: Secondary | ICD-10-CM | POA: Diagnosis not present

## 2023-02-23 DIAGNOSIS — E66813 Obesity, class 3: Secondary | ICD-10-CM

## 2023-02-23 DIAGNOSIS — Z6841 Body Mass Index (BMI) 40.0 and over, adult: Secondary | ICD-10-CM | POA: Diagnosis not present

## 2023-02-23 DIAGNOSIS — Z630 Problems in relationship with spouse or partner: Secondary | ICD-10-CM | POA: Diagnosis not present

## 2023-02-23 NOTE — Progress Notes (Deleted)
 Office: [phone]  /  Fax: [phone]  Weight Summary And Biometrics  Vitals Temp: 98 F (36.7 C) BP: 120/70 Pulse Rate: 85 SpO2: 98 %   Anthropometric Measurements Height: 4\' 11"  (1.499 m) Weight: 201 lb (91.2 kg) BMI (Calculated): 40.58 Weight at Last Visit: 200 lb Weight Lost Since Last Visit: 0 Weight Gained Since Last Visit: 1 lb Starting Weight: 214 lb Total Weight Loss (lbs): 13 lb (5.897 kg) Peak Weight: 225 lb   Body Composition  Body Fat %: 46.4 % Fat Mass (lbs): 93.2 lbs Muscle Mass (lbs): 102.4 lbs Total Body Water (lbs): 71.4 lbs Visceral Fat Rating : 14    No data recorded Today's Visit #: 28  Starting Date: 03/05/21   Subjective   Chief Complaint: Obesity  Discussed the use of AI scribe software for clinical note transcription with the patient, who gave verbal consent to proceed.  History of Present Illness             Assessment and Plan   Treatment Plan For Obesity:  Recommended Dietary Goals  Kathleen Odonnell is currently in the action stage of change. As such, her goal is to continue weight management plan. She has agreed to: {EMWTLOSSPLAN:29297::"continue current plan"}  Behavioral Health and Counseling  We discussed the following behavioral modification strategies today: {EMWMwtlossstrategies:28914::"continue to work on maintaining a reduced calorie state, getting the recommended amount of protein, incorporating whole foods, making healthy choices, staying well hydrated and practicing mindfulness when eating."}.  Additional education and resources provided today: {EMadditionalresources:29169::"None"}  Recommended Physical Activity Goals  Kathleen Odonnell has been advised to work up to 150 minutes of moderate intensity aerobic activity a week and strengthening exercises 2-3 times per week for cardiovascular health, weight loss maintenance and preservation of muscle mass.   She has agreed to :  {EMEXERCISE:28847::"Think about enjoyable  ways to increase daily physical activity and overcoming barriers to exercise","Increase physical activity in their day and reduce sedentary time (increase NEAT)."}  Pharmacotherapy  We discussed various medication options to help Kathleen Odonnell with her weight loss efforts and we both agreed to : {EMagreedrx:29170}  Associated Conditions Impacted by Obesity Treatment  Assessment & Plan Prediabetes  Class 3 severe obesity with serious comorbidity and body mass index (BMI) of 40.0 to 44.9 in adult, unspecified obesity type (HCC)  Stress due to marital problems     Assessment and Plan               Objective   Physical Exam:  Blood pressure 120/70, pulse 85, temperature 98 F (36.7 C), height 4\' 11"  (1.499 m), weight 201 lb (91.2 kg), SpO2 98%. Body mass index is 40.6 kg/m.  General: She is overweight, cooperative, alert, well developed, and in no acute distress. PSYCH: Has normal mood, affect and thought process.   HEENT: EOMI, sclerae are anicteric. Lungs: Normal breathing effort, no conversational dyspnea. Extremities: No edema.  Neurologic: No gross sensory or motor deficits. No tremors or fasciculations noted.    Diagnostic Data Reviewed:  BMET    Component Value Date/Time   NA 142 04/21/2022 0917   K 4.0 04/21/2022 0917   CL 103 04/21/2022 0917   CO2 21 04/21/2022 0917   GLUCOSE 79 04/21/2022 0917   BUN 10 04/21/2022 0917   CREATININE 0.71 04/21/2022 0917   CALCIUM 8.6 (L) 04/21/2022 0917   Lab Results  Component Value Date   HGBA1C 5.6 08/25/2022   HGBA1C 5.8 (H) 03/04/2021   Lab Results  Component Value Date  INSULIN 4.6 04/21/2022   INSULIN 11.4 03/04/2021   Lab Results  Component Value Date   TSH 1.860 03/04/2021   CBC    Component Value Date/Time   WBC 8.5 11/08/2021 0803   WBC 12.6 (H) 06/28/2007 0550   RBC 4.40 11/08/2021 0803   RBC 3.09 (L) 06/28/2007 0550   HGB 12.1 11/08/2021 0803   HCT 36.3 11/08/2021 0803   PLT 201 06/28/2007 0550    MCV 83 11/08/2021 0803   MCH 27.5 11/08/2021 0803   MCHC 33.3 11/08/2021 0803   MCHC 33.8 06/28/2007 0550   RDW 15.6 (H) 11/08/2021 0803   Iron Studies    Component Value Date/Time   IRON 59 11/08/2021 0803   TIBC 294 11/08/2021 0803   FERRITIN 78 11/08/2021 0803   IRONPCTSAT 20 11/08/2021 0803   Lipid Panel     Component Value Date/Time   CHOL 184 04/21/2022 0917   TRIG 70 04/21/2022 0917   HDL 51 04/21/2022 0917   LDLCALC 120 (H) 04/21/2022 0917   Hepatic Function Panel     Component Value Date/Time   PROT 7.1 04/21/2022 0917   ALBUMIN 3.9 04/21/2022 0917   AST 15 04/21/2022 0917   ALT 8 04/21/2022 0917   ALKPHOS 80 04/21/2022 0917   BILITOT <0.2 04/21/2022 0917      Component Value Date/Time   TSH 1.860 03/04/2021 0953   Nutritional Lab Results  Component Value Date   VD25OH 47.5 04/21/2022   VD25OH 43.6 03/04/2021    Follow-Up   No follow-ups on file.[name] She was informed of the importance of frequent follow up visits to maximize her success with intensive lifestyle modifications for her multiple health conditions.  Attestation Statement   Reviewed by clinician on day of visit: allergies, medications, problem list, medical history, surgical history, family history, social history, and previous encounter notes.     Worthy Rancher, MD
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
## 2023-03-10 ENCOUNTER — Telehealth (HOSPITAL_COMMUNITY): Payer: Self-pay | Admitting: Psychiatry

## 2023-03-10 NOTE — Telephone Encounter (Signed)
 D:  [name], [facility] referred pt to virtual MH-IOP.  A:  Placed call to re-orient pt but there was no answer.  Left vm for pt to call the case mgr back.  Informed Lupita Leash. Plan to schedule pt for a CCA on 03-13-23 @ 9:30 a.m and for her to start in group on 03-14-23.

## 2023-03-13 ENCOUNTER — Encounter (HOSPITAL_COMMUNITY): Payer: Self-pay | Admitting: Psychiatry

## 2023-03-13 ENCOUNTER — Other Ambulatory Visit (HOSPITAL_COMMUNITY): Attending: Psychiatry | Admitting: Psychiatry

## 2023-03-13 DIAGNOSIS — Z1330 Encounter for screening examination for mental health and behavioral disorders, unspecified: Secondary | ICD-10-CM

## 2023-03-13 NOTE — Telephone Encounter (Signed)
 N/a sch error
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
## 2023-03-14 ENCOUNTER — Encounter (HOSPITAL_COMMUNITY): Payer: Self-pay | Admitting: Psychiatry

## 2023-03-14 ENCOUNTER — Encounter (HOSPITAL_COMMUNITY): Payer: Self-pay

## 2023-03-14 ENCOUNTER — Other Ambulatory Visit (HOSPITAL_COMMUNITY): Attending: Family | Admitting: Psychiatry

## 2023-03-14 DIAGNOSIS — F4310 Post-traumatic stress disorder, unspecified: Secondary | ICD-10-CM | POA: Insufficient documentation

## 2023-03-14 DIAGNOSIS — F331 Major depressive disorder, recurrent, moderate: Secondary | ICD-10-CM | POA: Diagnosis present

## 2023-03-14 DIAGNOSIS — G479 Sleep disorder, unspecified: Secondary | ICD-10-CM | POA: Insufficient documentation

## 2023-03-14 DIAGNOSIS — Z79899 Other long term (current) drug therapy: Secondary | ICD-10-CM | POA: Insufficient documentation

## 2023-03-14 MED ORDER — HYDROXYZINE PAMOATE 25 MG PO CAPS: 25.0000 mg | ORAL_CAPSULE | Freq: Every evening | ORAL | 0 refills | Status: DC | PRN

## 2023-03-14 NOTE — Progress Notes (Signed)
 Virtual Visit via Video Note  I connected with Kathleen Odonnell on 03/14/23 at  9:00 AM EDT by a video enabled telemedicine application and verified that I am speaking with the correct person using two identifiers.  Location: Patient: Home Provider: Office    I discussed the limitations of evaluation and management by telemedicine and the availability of in person appointments. The patient expressed understanding and agreed to proceed.   I discussed the assessment and treatment plan with the patient. The patient was provided an opportunity to ask questions and all were answered. The patient agreed with the plan and demonstrated an understanding of the instructions.   The patient was advised to call back or seek an in-person evaluation if the symptoms worsen or if the condition fails to improve as anticipated.  I provided 15 minutes of non-face-to-face time during this encounter.   Kathleen Rack, NP    Psychiatric Initial Adult Assessment   Patient Identification: Kathleen Odonnell MRN:  [id number] Date of Evaluation:  03/14/2023 Referral Source: Kathleen Odonnell Chief Complaint:   Chief Complaint  Patient presents with   Depression   Anxiety   ADHD   Visit Diagnosis:    ICD-10-CM   1. MDD (major depressive disorder), recurrent episode, moderate (HCC)  F33.1     2. PTSD (post-traumatic stress disorder)  F43.10       History of Present Illness:  Kathleen Odonnell Odonnell- Kathleen Odonnell 53 year old African-American female that was referred to intensive outpatient programming due to multiple psychosocial stressors.  Kathleen Odonnell a diagnosis related to major depressive disorder, generalized anxiety disorder, attention deficit disorder and posttraumatic stress disorder.  She is currently prescribed Wellbutrin 300 mg, Celexa 40 mg, trazodone 150 mg and Topamax 25 mg.  Reports financial concerns with her  most pressing issue is related to her divorce and current separation. Stated they have been  married for 25 years. Stated that her husband has been trying to get back in the home as he is requesting to retrieve his mail weekly.  Reports their encounters have been toxic and stressful.  [name] suicidal or home ideations.  Denied states she is currently followed by psych Arfeen for medication management.  States she has been taking and tolerating medical patients well.  States fair appetite.  States her sleep has been.  She reports symptoms of low self-esteem, mood irritability, racing thoughts and hopelessness.  Reports her sleep disturbance roughly started 2 to 3 months.  States constant worry related to financial instability.  Patient to start intensive outpatient programming on 03/14/2023  [name] denied any recent inpatient admissions.  States she has attended partial hospitalization twice in the past.  Reports a good relationship with her children 73 year old and a 55 year old son who currently still resides at home.  States her 55 year old is currently studying and or had her degree chemistry working on her masters degree.  [name] is sitting; she is alert/oriented x 4; calm/cooperative; and mood congruent with affect.  Patient is speaking in a clear tone at moderate volume, and normal pace; with good eye contact. Her thought process is coherent and relevant; There is no indication that she is currently responding to internal/external stimuli or experiencing delusional thought content.  Patient denies suicidal/self-harm/homicidal ideation, psychosis, and paranoia.  Patient has remained calm throughout assessment and has answered questions appropriately.   Associated Signs/Symptoms: Depression Symptoms:  depressed mood, feelings of worthlessness/guilt, difficulty concentrating, (Hypo) Manic Symptoms:  Distractibility, Irritable Mood, Anxiety Symptoms:  Excessive Worry, Psychotic Symptoms:  Hallucinations: None PTSD Symptoms: NA  Past Psychiatric History:   Previous  Psychotropic Medications: Yes   Substance Abuse History in the last 12 months:  No.  Consequences of Substance Abuse: NA  Past Medical History:  Past Medical History:  Diagnosis Date   Anxiety    Constipation    Depression    Seasonal allergies    Trigeminal neuralgia     Past Surgical History:  Procedure Laterality Date   CEREBRAL MICROVASCULAR DECOMPRESSION     CESAREAN SECTION     TUBAL LIGATION      Family Psychiatric History:   Family History:  Family History  Problem Relation Age of Onset   Breast cancer Mother 18   High blood pressure Mother    High Cholesterol Mother    Obesity Mother    Breast cancer Maternal Aunt    Bipolar disorder Maternal Aunt    Bipolar disorder Cousin    Anxiety disorder Cousin     Social History:   Social History   Socioeconomic History   Marital status: Married    Spouse name: Molly Maduro   Number of children: 2   Years of education: Masters   Highest education level: Not on file  Occupational History   Occupation: Garment/textile technologist  Tobacco Use   Smoking status: Never   Smokeless tobacco: Never  Vaping Use   Vaping status: Never Used  Substance and Sexual Activity   Alcohol use: Yes    Comment: occ   Drug use: No   Sexual activity: Not Currently    Birth control/protection: None  Other Topics Concern   Not on file  Social History Narrative   Drinks 1-2 caffeine drinks a day    Social Drivers of Health   Financial Resource Strain: Low Risk  (12/18/2017)   Overall Financial Resource Strain (CARDIA)    Difficulty of Paying Living Expenses: Not hard at all  Food Insecurity: Food Insecurity Present (12/18/2017)   Hunger Vital Sign    Worried About Running Out of Food in the Last Year: Sometimes true    Ran Out of Food in the Last Year: Sometimes true  Transportation Needs: No Transportation Needs (12/18/2017)   PRAPARE - Administrator, Civil Service (Medical): No    Lack of Transportation (Non-Medical):  No  Physical Activity: Inactive (12/18/2017)   Exercise Vital Sign    Days of Exercise per Week: 0 days    Minutes of Exercise per Session: 0 min  Stress: Stress Concern Present (12/18/2017)   [employer] of Occupational Health - Occupational Stress Questionnaire    Feeling of Stress : Rather much  Social Connections: Socially Integrated (12/18/2017)   Social Connection and Isolation Panel [NHANES]    Frequency of Communication with Friends and Family: More than three times a week    Frequency of Social Gatherings with Friends and Family: Not on file    Attends Religious Services: More than 4 times per year    Active Member of [employer] or Organizations: Yes    Attends Banker Meetings: 1 to 4 times per year    Marital Status: Married    Additional Social History:   Allergies:   Allergies  Allergen Reactions   Carbamazepine Hives    Metabolic Disorder Labs: Lab Results  Component Value Date   HGBA1C 5.6 08/25/2022   No results found for: "PROLACTIN" Lab Results  Component Value Date   CHOL 184 04/21/2022   TRIG 70 04/21/2022   HDL  51 04/21/2022   LDLCALC 120 (H) 04/21/2022   LDLCALC 150 (H) 11/08/2021   Lab Results  Component Value Date   TSH 1.860 03/04/2021    Therapeutic Level Labs: No results found for: "LITHIUM" No results found for: "CBMZ" No results found for: "VALPROATE"  Current Medications: Current Outpatient Medications  Medication Sig Dispense Refill   buPROPion (WELLBUTRIN XL) 300 MG 24 hr tablet Take 1 tablet (300 mg total) by mouth daily. 30 tablet 1   Cholecalciferol (VITAMIN D3) 50 MCG (2000 UT) capsule Take 1 capsule (2,000 Units total) by mouth daily.     citalopram (CELEXA) 40 MG tablet Take 1 tablet (40 mg total) by mouth daily. 30 tablet 2   hydrOXYzine (VISTARIL) 25 MG capsule Take 1 capsule (25 mg total) by mouth at bedtime and may repeat dose one time if needed. 60 capsule 0   Multiple Vitamin (MULTIVITAMIN) capsule Take  1 capsule by mouth daily.     topiramate (TOPAMAX) 25 MG tablet TAKE 1 TABLET(25 MG) BY MOUTH TWICE DAILY 180 tablet 0   topiramate (TOPAMAX) 50 MG tablet TAKE 1 TABLET(50 MG) BY MOUTH TWICE DAILY 180 tablet 0   traZODone (DESYREL) 150 MG tablet Take 1 tablet (150 mg total) by mouth at bedtime. 30 tablet 2   No current facility-administered medications for this visit.    Musculoskeletal: Virtual platform   Psychiatric Specialty Exam: Review of Systems  There were no vitals taken for this visit.There is no height or weight on file to calculate BMI.  General Appearance: Casual  Eye Contact:  Good  Speech:  Clear and Coherent  Volume:  Normal  Mood:  Anxious and Depressed  Affect:  Congruent  Thought Process:  Coherent  Orientation:  Full (Time, Place, and Person)  Thought Content:  Logical  Suicidal Thoughts:  No  Homicidal Thoughts:  No  Memory:  Immediate;   Good Recent;   Good  Judgement:  Good  Insight:  Good  Psychomotor Activity:  Normal  Concentration:  Concentration: Good  Recall:  Good  Fund of Knowledge:Good  Language: Good  Akathisia:  No  Handed:  Right  AIMS (if indicated):  not done  Assets:  Communication Skills Desire for Improvement Physical Health Resilience Social Support  ADL's:  Intact  Cognition: WNL  Sleep:  Poor reported she is unable to stay asleep, stated he symptoms started 2/3 months ago.    Screenings: PHQ2-9    Flowsheet Row Counselor from 03/13/2023 in [facility] Office Visit from 03/04/2021 in [facility] from 04/06/2020 in BE[facility]AVIORAL HEALTH INT[facility] PSYCH  PHQ-2 Total Score 6 2 6   PHQ-9 Total Score 24 14 24       Flowsheet Row Video Visit from [facility]29/2020 [facility] Counselor from 04/06/2020 in BEHAVIORAL HEALTH INTENSIVE PSYCH  C-SSRS RISK CATEGORY No Risk No Risk       Assessment and Plan:  Start Intensive Outpatient  Programming (IOP) Continue Wellbutrin 300 mg daily Continue Celexa 40 mg daily Initiated hydroxyzine 25 to 50 mg nightly Continue Topamax 50 mg daily Continue trazodone 150 mg nightly  Collaboration of Care: Medication Management AEB initiated hydroxyzine  Patient/Guardian was advised Release of Information must be obtained prior to any record release in order to collaborate their care with an outside provider. Patient/Guardian was advised if they have not already done so to contact the registration department to sign all necessary forms in order for [place] to  release information regarding their care.   Consent: Patient/Guardian gives verbal consent for treatment and assignment of benefits for services provided during this visit. Patient/Guardian expressed understanding and agreed to proceed.   Kathleen Rack, NP 3/11/20251:32 PM

## 2023-03-14 NOTE — Progress Notes (Signed)
 Virtual Visit via Video Note   I connected with Kathleen Odonnell on 03/14/23 at  9:00 AM EDT by a video enabled telemedicine application and verified that I am speaking with the correct person using two identifiers.   At orientation to the IOP program, Case Manager discussed the limitations of evaluation and management by telemedicine and the availability of in person appointments. The patient expressed understanding and agreed to proceed with virtual visits throughout the duration of the program.   Location:  Patient: Patient Home Provider: [facility]   History of Present Illness: MDD and PTSD   Observations/Objective: Check In: Case Manager checked in with all participants to review discharge dates, insurance authorizations, work-related documents and needs from the treatment team regarding medications. Kathleen Odonnell stated needs and engaged in discussion.    Initial Therapeutic Activity: Counselor facilitated a check-in with Kathleen Odonnell to assess for safety, sobriety and medication compliance.  Counselor also inquired about Kathleen Odonnell's current emotional ratings, as well as any significant changes in thoughts, feelings or behavior since previous check in.  Kathleen Odonnell presented for session on time and was alert, oriented x5, with no evidence or self-report of active SI/HI or A/V H.  Kathleen Odonnell reported compliance with medication and denied use of alcohol or illicit substances.  Kathleen Odonnell reported scores of 10/10 for depression, 10/10 for anxiety, and 8/10 for anger/irritability.  Kathleen Odonnell denied any recent outbursts or panic attacks.  Kathleen Odonnell reported that a struggle has been dealing with a recent separation, which has led to financial stress.  Kathleen Odonnell stated "My depression and anxiety has gone through the roof and I can't focus anymore at work".  Kathleen Odonnell reported that a success has been starting to put some boundaries in place to reduce contact with her ex.  Kathleen Odonnell reported that her goal today is to contact her lawyer after group,  and then plant some bell peppers with her son.          Second Therapeutic Activity: Counselor introduced Kathleen Odonnell, [place] Chaplain to provide psychoeducation on topic of Grief and Loss with members today.  Kathleen Odonnell began discussion by checking in with the group about their baseline mood today, general thoughts on what grief means to them and how it has affected them personally in the past.  Kathleen Odonnell provided information on how the process of grief/loss can differ depending upon one's unique culture, and categories of loss one could experience (i.e. loss of a person, animal, relationship, job, identity, etc).  Kathleen Odonnell encouraged members to be mindful of how pervasive loss can be, and how to recognize signs which could indicate that this is having an impact on one's overall mental health and wellbeing.  Intervention was effective, as evidenced by Kathleen Odonnell participating in discussion with speaker on the subject, reporting that her therapist pointed out recently that she may be grieving.  Kathleen Odonnell reported that she may be processing the loss of her marriage, stating "We had been together for years".  Kathleen Odonnell reported that this has been especially difficult to deal with in the workplace, stating "Its like losing a part of you".  Kathleen Odonnell was receptive to feedback from chaplain on healthy approaches to coping with this separation, including importance of having a support network that can empathize.      Third Therapeutic Activity: Counselor discussed topic of sleep hygiene today with group.  Counselor defined this as the habits, behaviors and environmental factors that can be adjusted to improve overall sleep quality.  Counselor also discussed how lack of consistent sleep  can negatively affect mood and overall mental health.  Counselor provided members with a screening to complete assessing typical barriers one might face in achieving quality sleep at night (i.e. struggling to get up in the morning, waking up throughout the  night, tossing/turning, etc), and inquired about members' perception of sleep hygiene at present.  Counselor offered techniques to members via a virtual handout which could be implemented in order to improve sleep hygiene, such as sticking to a normal morning/night routine, avoiding using of electronics too close to bedtime, avoiding heavy meals before bed, using a sleep journal to track changes and address anxious thoughts, as well as avoiding naps during the daytime, ensuring proper use of medications if prescribed any by provider(s), and more.  Counselor inquired about changes members intend to make to sleep hygiene based upon information covered today.  Interventions were effective, as evidenced by Kathleen Odonnell actively participating in discussion on subject, reporting that she only gets around 5 hours of sleep most nights.  Kathleen Odonnell also completed a sleep assessment, which confirmed that she is sleep deprived at present.  Kathleen Odonnell reported that she plans to improve sleep hygiene over the course of treatment by scheduling a sleep study to see if she has sleep apnea due to history of snoring, follow a more normal sleep/wake routine, limit use of social media too close to bedtime, reduce consumption of soda at bedtime, spend less time in the bedroom during the daytime, and avoid 'clock watching' throughout the night.    Assessment and Plan: Counselor recommends that Kathleen Odonnell remain in IOP treatment to better manage mental health symptoms, ensure stability and pursue completion of treatment plan goals. Counselor recommends adherence to crisis/safety plan, taking medications as prescribed, and following up with medical professionals if any issues arise.    Follow Up Instructions: Counselor will send Webex link for session tomorrow.  Kathleen Odonnell was advised to call back or seek an in-person evaluation if the symptoms worsen or if the condition fails to improve as anticipated.   Collaboration of Care:   Medication Management AEB  Kathleen Jacks, NP or Kathleen Odonnell                                          Case Manager AEB [name], CNA    Patient/Guardian was advised Release of Information must be obtained prior to any record release in order to collaborate their care with an outside provider. Patient/Guardian was advised if they have not already done so to contact the registration department to sign all necessary forms in order for [place] to release information regarding their care.    Consent: Patient/Guardian gives verbal consent for treatment and assignment of benefits for services provided during this visit. Patient/Guardian expressed understanding and agreed to proceed.   I provided 180 minutes of non-face-to-face time during this encounter.   [name], [place], LCAS 03/14/23

## 2023-03-15 ENCOUNTER — Other Ambulatory Visit (HOSPITAL_COMMUNITY): Admitting: Licensed Clinical Social Worker

## 2023-03-15 DIAGNOSIS — F331 Major depressive disorder, recurrent, moderate: Secondary | ICD-10-CM

## 2023-03-15 DIAGNOSIS — F4310 Post-traumatic stress disorder, unspecified: Secondary | ICD-10-CM

## 2023-03-15 NOTE — Progress Notes (Signed)
 Virtual Visit via Video Note   I connected with Kathleen Odonnell on 03/15/23 at  9:00 AM EDT by a video enabled telemedicine application and verified that I am speaking with the correct person using two identifiers.   At orientation to the IOP program, Case Manager discussed the limitations of evaluation and management by telemedicine and the availability of in person appointments. The patient expressed understanding and agreed to proceed with virtual visits throughout the duration of the program.   Location:  Patient: Patient Home Provider: [facility]   History of Present Illness: MDD and PTSD   Observations/Objective: Check In: Case Manager checked in with all participants to review discharge dates, insurance authorizations, work-related documents and needs from the treatment team regarding medications. Kathleen Odonnell stated needs and engaged in discussion.    Initial Therapeutic Activity: Counselor facilitated a check-in with Kathleen Odonnell to assess for safety, sobriety and medication compliance.  Counselor also inquired about Kathleen Odonnell's current emotional ratings, as well as any significant changes in thoughts, feelings or behavior since previous check in.  Kathleen Odonnell presented for session on time and was alert, oriented x5, with no evidence or self-report of active SI/HI or A/V H.  Kathleen Odonnell reported compliance with medication and denied use of alcohol or illicit substances.  Kathleen Odonnell reported scores of 10/10 for depression, 8/10 for anxiety, and 6/10 for irritability.  Kathleen Odonnell denied any recent outbursts.  Kathleen Odonnell reported that a struggle was getting very angry and having a panic attack last night when she was looking through pictures of her family.  She reported that she ended up deleting many of these pictures afterward to reduce overall triggers.  Kathleen Odonnell reported that her goal today is to go out to lunch with a friend from her sorority.            Second Therapeutic Activity: Counselor introduced topic of building a  social support network today.  Counselor explained how this can be defined as having a having a group of healthy people in one's life you can talk to, spend time with, and get help from to improve both mental and physical health.  Counselor noted that some barriers can make it difficult to connect with other people, including the presence of anxiety or depression, or moving to an unfamiliar area.  Group members were asked to assess the current state of their support network, and identify ways that this could be improved.  Tips were given on how to address previously noted barriers, such as strengthening social skills, using relaxation techniques to reduce anxiety, scheduling social time each week, and/or exploring social events nearby which could increase chances of meeting new supports.  Members were also encouraged to consider getting closer to people they already know through suggestions such as outreaching someone by text, email or phone call if they haven't spoken in awhile, doing something nice for a friend/family member unexpectedly, and/or inviting someone over for a game/movie/dinner night.  Intervention was effective, as evidenced by Kathleen Odonnell actively participating in discussion on the subject, and reporting that she has a robust support network composed of family and friends.  Kathleen Odonnell stated "We get together and do service projects and socials and community service.  We are always reaching out and helping each other".  She reported that her goal will be to continue taking advantage of new opportunities for socialization to avoid isolation as she battles depression, noting that her sorority has several upcoming events she could participate in, and a friend from church has invited her  to line dancing classes soon.    Assessment and Plan: Counselor recommends that Kathleen Odonnell remain in IOP treatment to better manage mental health symptoms, ensure stability and pursue completion of treatment plan goals. Counselor  recommends adherence to crisis/safety plan, taking medications as prescribed, and following up with medical professionals if any issues arise.    Follow Up Instructions: Counselor will send Webex link for session tomorrow.  Kathleen Odonnell was advised to call back or seek an in-person evaluation if the symptoms worsen or if the condition fails to improve as anticipated.   Collaboration of Care:   Medication Management AEB [name], NP or Dr. [name]                                          Case Manager AEB [name], CNA    Patient/Guardian was advised Release of Information must be obtained prior to any record release in order to collaborate their care with an outside provider. Patient/Guardian was advised if they have not already done so to contact the registration department to sign all necessary forms in order for [place] to release information regarding their care.    Consent: Patient/Guardian gives verbal consent for treatment and assignment of benefits for services provided during this visit. Patient/Guardian expressed understanding and agreed to proceed.   I provided 180 minutes of non-face-to-face time during this encounter.   [name], LCSW, LCAS 03/15/23

## 2023-03-16 ENCOUNTER — Other Ambulatory Visit (HOSPITAL_COMMUNITY)

## 2023-03-16 DIAGNOSIS — F331 Major depressive disorder, recurrent, moderate: Secondary | ICD-10-CM

## 2023-03-16 DIAGNOSIS — F4310 Post-traumatic stress disorder, unspecified: Secondary | ICD-10-CM
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
## 2023-03-17 ENCOUNTER — Other Ambulatory Visit (HOSPITAL_COMMUNITY): Admitting: Psychiatry

## 2023-03-17 DIAGNOSIS — F4310 Post-traumatic stress disorder, unspecified: Secondary | ICD-10-CM

## 2023-03-17 DIAGNOSIS — F331 Major depressive disorder, recurrent, moderate: Secondary | ICD-10-CM | POA: Diagnosis not present
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
## 2023-03-20 ENCOUNTER — Telehealth (HOSPITAL_COMMUNITY): Payer: 59 | Admitting: Psychiatry

## 2023-03-20 ENCOUNTER — Other Ambulatory Visit (HOSPITAL_COMMUNITY): Admitting: Licensed Clinical Social Worker

## 2023-03-20 DIAGNOSIS — F331 Major depressive disorder, recurrent, moderate: Secondary | ICD-10-CM | POA: Diagnosis not present

## 2023-03-20 DIAGNOSIS — F4310 Post-traumatic stress disorder, unspecified: Secondary | ICD-10-CM

## 2023-03-20 NOTE — Progress Notes (Signed)
 Virtual Visit via Video Note   I connected with Kathleen Odonnell on 03/20/23 at  9:00 AM EDT by a video enabled telemedicine application and verified that I am speaking with the correct person using two identifiers.   At orientation to the IOP program, Case Manager discussed the limitations of evaluation and management by telemedicine and the availability of in person appointments. The patient expressed understanding and agreed to proceed with virtual visits throughout the duration of the program.   Location:  Patient: Patient Home Provider: [facility]   History of Present Illness: MDD and PTSD   Observations/Objective: Check In: Case Manager checked in with all participants to review discharge dates, insurance authorizations, work-related documents and needs from the treatment team regarding medications. Kathleen Odonnell stated needs and engaged in discussion.    Initial Therapeutic Activity: Counselor facilitated a check-in with Kathleen Odonnell to assess for safety, sobriety and medication compliance.  Counselor also inquired about Kathleen Odonnell's current emotional ratings, as well as any significant changes in thoughts, feelings or behavior since previous check in.  Kathleen Odonnell presented for session on time and was alert, oriented x5, with no evidence or self-report of active SI/HI or A/V H.  Kathleen Odonnell reported compliance with medication and denied use of alcohol or illicit substances.  Kathleen Odonnell reported scores of 8/10 for depression, 8/10 for anxiety, and 7/10 for irritability.  Kathleen Odonnell denied any recent outbursts or panic attacks.  Kathleen Odonnell reported that a success was attending a track meet for her son over the weekend, and maintaining a boundary with her ex-husband, who was in attendance.  Kathleen Odonnell reported that she also attended a parenting class, but her ex didn't show up to that, which was an additional success for her.  Kathleen Odonnell reported that her goal today is to get some rest since she didn't sleep well over the weekend.             Second Therapeutic Activity: Counselor introduced topic of grounding skills today.  Counselor defined these as simple strategies one can use to help detach from difficult thoughts or feelings temporarily by focusing on something else.  Counselor noted that grounding will not solve the problem at hand, but can provide the practitioner with time to regain control over their thoughts and/or feelings and prevent the situation from getting worse (i.e. interrupting a panic attack).  Counselor divided these into three categories (mental, physical, and soothing) and then provided examples of each which group members could practice during session.  Some of these included describing one's environment in detail or playing a categories game with oneself for mental category, taking a hot bath/shower, stretching, or carrying a grounding object for physical category, and saying kind statements, or visualizing people one cares about for soothing category.  Counselor inquired about which techniques members have used with success in the past, or will commit to learning, practicing, and applying now to improve coping abilities.  Intervention was effective, as evidenced by Kathleen Odonnell participating in discussion on the subject, trying out several of the techniques during session, and expressing interest in adding several to her available coping skills, such as playing a categories game involving listing different genres of books, visualizing a sunset on a cruise ship, reading a horror story that makes her laugh, grabbing tightly onto her armrests, practicing deep breathing, and telling herself kind statements like "Be gentle to yourself" or reciting the serenity prayer.       Assessment and Plan: Counselor recommends that Kathleen Odonnell remain in IOP treatment to better manage  mental health symptoms, ensure stability and pursue completion of treatment plan goals. Counselor recommends adherence to crisis/safety plan, taking medications as  prescribed, and following up with medical professionals if any issues arise.    Follow Up Instructions: Counselor will send Webex link for session tomorrow.  Kathleen Odonnell was advised to call back or seek an in-person evaluation if the symptoms worsen or if the condition fails to improve as anticipated.   Collaboration of Care:   Medication Management AEB [name], NP or Dr. [name]                                          Case Manager AEB [name], CNA    Patient/Guardian was advised Release of Information must be obtained prior to any record release in order to collaborate their care with an outside provider. Patient/Guardian was advised if they have not already done so to contact the registration department to sign all necessary forms in order for [place] to release information regarding their care.    Consent: Patient/Guardian gives verbal consent for treatment and assignment of benefits for services provided during this visit. Patient/Guardian expressed understanding and agreed to proceed.   I provided 180 minutes of non-face-to-face time during this encounter.   [name], [place], LCAS 03/20/23

## 2023-03-21 ENCOUNTER — Other Ambulatory Visit (HOSPITAL_COMMUNITY): Admitting: Psychiatry

## 2023-03-21 DIAGNOSIS — F331 Major depressive disorder, recurrent, moderate: Secondary | ICD-10-CM | POA: Diagnosis not present

## 2023-03-21 DIAGNOSIS — F902 Attention-deficit hyperactivity disorder, combined type: Secondary | ICD-10-CM

## 2023-03-21 DIAGNOSIS — F4310 Post-traumatic stress disorder, unspecified: Secondary | ICD-10-CM

## 2023-03-21 MED ORDER — BUPROPION HCL ER (XL) 300 MG PO TB24: 300.0000 mg | ORAL_TABLET | Freq: Every day | ORAL | 1 refills | Status: DC

## 2023-03-21 NOTE — Progress Notes (Addendum)
 Reorder Wellbutrin patient reported she was running low.

## 2023-03-21 NOTE — Progress Notes (Signed)
 Virtual Visit via Video Note   I connected with Jazzlynn R. Monroe-Leach on 03/21/23 at  9:00 AM EDT by a video enabled telemedicine application and verified that I am speaking with the correct person using two identifiers.   At orientation to the IOP program, Case Manager discussed the limitations of evaluation and management by telemedicine and the availability of in person appointments. The patient expressed understanding and agreed to proceed with virtual visits throughout the duration of the program.   Location:  Patient: [facility] Home Provider: [facility]   History of Present Illness: MDD and PTSD   Observations/Objective: Check In: Case Manager checked in with all participants to review discharge dates, insurance authorizations, work-related documents and needs from the treatment team regarding medications. Mayleigh stated needs and engaged in discussion.    Initial Therapeutic Activity: Counselor facilitated a check-in with Rosell to assess for safety, sobriety and medication compliance.  Counselor also inquired about Myrlene's current emotional ratings, as well as any significant changes in thoughts, feelings or behavior since previous check in.  Prudie presented for session on time and was alert, oriented x5, with no evidence or self-report of active SI/HI or A/V H.  Elayjah reported compliance with medication and denied use of alcohol or illicit substances.  Shelda reported scores of 8/10 for depression, 8/10 for anxiety, and 7/10 for irritability.  Dalyah denied any recent outbursts or panic attacks.  Caliah reported that a struggle was almost having a panic attack when stressing about bills yesterday, although she was able to take a time out and calm down.  Zyasia reported that her goal today is to reach out to a local nonprofit about utility assistance to cut down on financial stress and do some work for her second job.            Second Therapeutic Activity: Counselor introduced [employer],  [place] Chaplain to provide psychoeducation on topic of Grief and Loss with members today.  [name] began discussion by checking in with the group about their baseline mood today, general thoughts on what grief means to them and how it has affected them personally in the past.  [name] provided information on how the process of grief/loss can differ depending upon one's unique culture, and categories of loss one could experience (i.e. loss of a person, animal, relationship, job, identity, etc).  [name] encouraged members to be mindful of how pervasive loss can be, and how to recognize signs which could indicate that this is having an impact on one's overall mental health and wellbeing.  Intervention effectiveness could not be measured, as client answered an icebreaker question from chaplain, but did not participate in discussion on topic of grief.   Third Therapeutic Activity:  Psycho-educational portion of group was provided by Irwin Brakeman. Zenaida Niece, Interior and spatial designer of community education and engagement with [employer] [facility] [place].  Magda Paganini provided information on history of her local agency, mission statement, and the variety of unique services offered which group members might find beneficial to engage in, including free legal guidance, assistance with job hunting, Publishing rights manager counseling, and workshops.   Magda Paganini offered time to answer member's questions regarding services and encouraged them to consider utilizing these services to assist in working towards their individual wellness goals.  Intervention effectiveness could not be measured, as client did not participate.    Assessment and Plan: Counselor recommends that Lynsee remain in IOP treatment to better manage mental health symptoms, ensure stability and pursue completion of treatment plan  goals. Counselor recommends adherence to crisis/safety plan, taking medications as prescribed, and following up with medical professionals if any issues arise.     Follow Up Instructions: Counselor will send Webex link for session tomorrow.  Jan was advised to call back or seek an in-person evaluation if the symptoms worsen or if the condition fails to improve as anticipated.   Collaboration of Care:   Medication Management AEB [name], NP or Dr. [name]                                          Case Manager AEB [name], CNA    Patient/Guardian was advised Release of Information must be obtained prior to any record release in order to collaborate their care with an outside provider. Patient/Guardian was advised if they have not already done so to contact the registration department to sign all necessary forms in order for [place] to release information regarding their care.    Consent: Patient/Guardian gives verbal consent for treatment and assignment of benefits for services provided during this visit. Patient/Guardian expressed understanding and agreed to proceed.   I provided 180 minutes of non-face-to-face time during this encounter.   [name], LCSW, LCAS 03/21/23

## 2023-03-22 ENCOUNTER — Other Ambulatory Visit (HOSPITAL_COMMUNITY): Admitting: Psychiatry

## 2023-03-22 ENCOUNTER — Telehealth (HOSPITAL_COMMUNITY): Payer: Self-pay | Admitting: Psychiatry

## 2023-03-23 ENCOUNTER — Other Ambulatory Visit (HOSPITAL_COMMUNITY): Admitting: Licensed Clinical Social Worker

## 2023-03-23 DIAGNOSIS — F331 Major depressive disorder, recurrent, moderate: Secondary | ICD-10-CM

## 2023-03-23 DIAGNOSIS — F4310 Post-traumatic stress disorder, unspecified: Secondary | ICD-10-CM

## 2023-03-23 NOTE — Progress Notes (Signed)
 Virtual Visit via Video Note   I connected with Shatoya R. Monroe-Leach on 03/23/23 at  9:00 AM EDT by a video enabled telemedicine application and verified that I am speaking with the correct person using two identifiers.   At orientation to the IOP program, Case Manager discussed the limitations of evaluation and management by telemedicine and the availability of in person appointments. The patient expressed understanding and agreed to proceed with virtual visits throughout the duration of the program.   Location:  Patient: [facility] Home Provider: [facility]   History of Present Illness: MDD and PTSD   Observations/Objective: Check In: Case Manager checked in with all participants to review discharge dates, insurance authorizations, work-related documents and needs from the treatment team regarding medications. Chantay stated needs and engaged in discussion.    Initial Therapeutic Activity: Counselor facilitated a check-in with Roxane to assess for safety, sobriety and medication compliance.  Counselor also inquired about Myna's current emotional ratings, as well as any significant changes in thoughts, feelings or behavior since previous check in.  Charmon presented for session on time and was alert, oriented x5, with no evidence or self-report of active SI/HI or A/V H.  Niyanna reported compliance with medication and denied use of alcohol or illicit substances.  Mili reported scores of 8/10 for depression, 8/10 for anxiety, and 7/10 for anger/irritability.  Millette denied any recent outbursts or panic attacks.  Analiz reported that a struggle was feeling sick yesterday, and missing group as a result.  Lenoir reported that she still feels weak this morning, and will try to eat a small snack for breakfast.  Aliyanna reported that a success was picking up her medication refill from the pharmacy despite how she felt.  Elly reported that her goal today is to go out with a friend for lunch.           Second  Therapeutic Activity: Counselor introduced topic of self-care today.  Counselor explained how this can be defined as the things one does to maintain good health and improve well-being.  Counselor provided members with a self-care assessment form to complete.  This handout featured various sub-categories of self-care, including physical, psychological/emotional, social, spiritual, and professional.  Members were asked to rank their engagement in the activities listed for each dimension on a scale of 1-3, with 1 indicating 'Poor', 2 indicating 'Ok', and 3 indicating 'Well'.  Counselor invited members to share results of their assessment, and inquired about which areas of self-care they are doing well in, as well as areas that require attention, and how they plan to begin addressing this during treatment.  Intervention was effective, as evidenced by [name] successfully completing initial 2 sections of assessment and actively engaging in discussion on subject, reporting that she is excelling in areas such as recognizing own strengths and accomplishments, going on vacations or day trips, and talking about her problems, but would benefit from focusing more on areas such as finding reasons to laugh, doing comforting activities, expressing her feelings in healthy ways, learning new things unrelated to work, getting away from distractions, and participating in hobbies.  Marnesha reported that she would work to improve self-care deficits by watching cheesy horror movies that make her smile or laugh, putting on comfortable clothes each day, setting a boundary with work notifications in order to reduce related stress, exploring diamond art as a new hobby, or joining a line dancing class.    Assessment and Plan: Counselor recommends that Shaketta remain in IOP treatment  to better manage mental health symptoms, ensure stability and pursue completion of treatment plan goals. Counselor recommends adherence to crisis/safety plan, taking  medications as prescribed, and following up with medical professionals if any issues arise.    Follow Up Instructions: Counselor will send Webex link for session tomorrow.  Daneka was advised to call back or seek an in-person evaluation if the symptoms worsen or if the condition fails to improve as anticipated.   Collaboration of Care:   Medication Management AEB [name], NP or Dr. [name]                                          Case Manager AEB [name], CNA    Patient/Guardian was advised Release of Information must be obtained prior to any record release in order to collaborate their care with an outside provider. Patient/Guardian was advised if they have not already done so to contact the registration department to sign all necessary forms in order for [place] to release information regarding their care.    Consent: Patient/Guardian gives verbal consent for treatment and assignment of benefits for services provided during this visit. Patient/Guardian expressed understanding and agreed to proceed.   I provided 180 minutes of non-face-to-face time during this encounter.   [name], LCSW, LCAS 03/23/23

## 2023-03-24 ENCOUNTER — Other Ambulatory Visit (HOSPITAL_COMMUNITY): Admitting: Licensed Clinical Social Worker

## 2023-03-24 DIAGNOSIS — F331 Major depressive disorder, recurrent, moderate: Secondary | ICD-10-CM

## 2023-03-24 DIAGNOSIS — F4310 Post-traumatic stress disorder, unspecified: Secondary | ICD-10-CM

## 2023-03-24 NOTE — Progress Notes (Signed)
 Virtual Visit via Video Note   I connected with Kathleen Odonnell on 03/24/23 at  9:00 AM EDT by a video enabled telemedicine application and verified that I am speaking with the correct person using two identifiers.   At orientation to the IOP program, Case Manager discussed the limitations of evaluation and management by telemedicine and the availability of in person appointments. The patient expressed understanding and agreed to proceed with virtual visits throughout the duration of the program.   Location:  Patient: Patient Home Provider: Home Office   History of Present Illness: MDD and PTSD   Observations/Objective: Check In: Case Manager checked in with all participants to review discharge dates, insurance authorizations, work-related documents and needs from the treatment team regarding medications. Kathleen Odonnell stated needs and engaged in discussion.    Initial Therapeutic Activity: Counselor facilitated a check-in with Kathleen Odonnell to assess for safety, sobriety and medication compliance.  Counselor also inquired about Kathleen Odonnell's current emotional ratings, as well as any significant changes in thoughts, feelings or behavior since previous check in.  Kathleen Odonnell presented for session on time and was alert, oriented x5, with no evidence or self-report of active SI/HI or A/V H.  Kathleen Odonnell reported compliance with medication and denied use of alcohol or illicit substances.  Kathleen Odonnell reported scores of 7/10 for depression, 6/10 for anxiety, and 6/10 for irritability.  Kathleen Odonnell denied any recent panic attacks.  Kathleen Odonnell reported that a struggle was having a friend call and claim that she wasn't trying hard enough to get better, which triggered an outburst.  Kathleen Odonnell reported that a success was going out with a different friend, who was supportive and bought her lunch.  Kathleen Odonnell reported that her goal today is to take her mother out to run some errands.            Second Therapeutic Activity: Counselor covered topic of core beliefs  with group today.  Counselor virtually shared a handout on the subject, which explained how everyone looks at the world differently, and two people can have the same experience, but have different interpretations of what happened.  Members were encouraged to think of these like sunglasses with different "shades" influencing perception towards positive or negative outcomes.  Examples of negative core beliefs were provided, such as "I'm unlovable", "I'm not good enough", and "I'm a bad person".  Members were asked to share which one(s) they could relate to, and then identify evidence which contradicts these beliefs.  Counselor also provided psychoeducation on positive affirmations today.  Counselor explained how these are positive statements which can be spoken out loud or recited mentally to challenge negative thoughts and/or core beliefs to improve mood and outlook each day.  Counselor shared a comprehensive list of affirmations virtually to members with different categories, including ones for health, confidence, success, and happiness.  Counselor invited members to look through this list and identify any which resonated with them, and practice saying them out loud with sincerity.  Intervention was effective, as evidenced by Kathleen Odonnell successfully participating in discussion on the subject and reporting that she could relate to several negative core beliefs listed on the handout, such as "I am weak", "I am unlovable", and "I am worthless".  Kathleen Odonnell was able to successfully challenge the core belief "People can't be trusted" by listing evidence that contradicted it, reporting that she has been able to loosen her boundary with her support network and be rewarded by it, including receiving empathy and support from peers in group, friends, and her church,  who recently agreed to pay her bills.  Kathleen Odonnell also reported that she liked several of the positive affirmations listed, such as "I press on because I believe in my path",  and "I compare myself only to my highest self".      Assessment and Plan: Counselor recommends that Kathleen Odonnell remain in IOP treatment to better manage mental health symptoms, ensure stability and pursue completion of treatment plan goals. Counselor recommends adherence to crisis/safety plan, taking medications as prescribed, and following up with medical professionals if any issues arise.    Follow Up Instructions: Counselor will send Webex link for session tomorrow.  Kathleen Odonnell was advised to call back or seek an in-person evaluation if the symptoms worsen or if the condition fails to improve as anticipated.   Collaboration of Care:   Medication Management AEB [name], NP or Dr. [name]                                          Case Manager AEB [name], CNA    Patient/Guardian was advised Release of Information must be obtained prior to any record release in order to collaborate their care with an outside provider. Patient/Guardian was advised if they have not already done so to contact the registration department to sign all necessary forms in order for [place] to release information regarding their care.    Consent: Patient/Guardian gives verbal consent for treatment and assignment of benefits for services provided during this visit. Patient/Guardian expressed understanding and agreed to proceed.   I provided 180 minutes of non-face-to-face time during this encounter.   [name], LCSW, LCAS 03/24/23

## 2023-03-27 ENCOUNTER — Other Ambulatory Visit (HOSPITAL_COMMUNITY): Admitting: Licensed Clinical Social Worker

## 2023-03-27 DIAGNOSIS — F331 Major depressive disorder, recurrent, moderate: Secondary | ICD-10-CM | POA: Diagnosis not present

## 2023-03-27 DIAGNOSIS — F4310 Post-traumatic stress disorder, unspecified: Secondary | ICD-10-CM

## 2023-03-27 NOTE — Progress Notes (Signed)
 Virtual Visit via Video Note   I connected with Kathleen Odonnell on 03/27/23 at  9:00 AM EDT by a video enabled telemedicine application and verified that I am speaking with the correct person using two identifiers.   At orientation to the IOP program, Case Manager discussed the limitations of evaluation and management by telemedicine and the availability of in person appointments. The patient expressed understanding and agreed to proceed with virtual visits throughout the duration of the program.   Location:  Patient: [facility] Home Provider: [facility]   History of Present Illness: MDD and PTSD   Observations/Objective: Check In: Case Manager checked in with all participants to review discharge dates, insurance authorizations, work-related documents and needs from the treatment team regarding medications. Kathleen Odonnell stated needs and engaged in discussion.    Initial Therapeutic Activity: Counselor facilitated a check-in with Kathleen Odonnell to assess for safety, sobriety and medication compliance.  Counselor also inquired about Kathleen Odonnell's current emotional ratings, as well as any significant changes in thoughts, feelings or behavior since previous check in.  Kathleen Odonnell presented for session on time and was alert, oriented x5, with no evidence or self-report of active SI/HI or A/V H.  Kathleen Odonnell reported compliance with medication and denied use of alcohol or illicit substances.  Kathleen Odonnell reported scores of 5/10 for depression, 5/10 for anxiety, and 0/10 for anger/irritability.  Kathleen Odonnell denied any recent outbursts or panic attacks.  Kathleen Odonnell denied any new struggles.  Kathleen Odonnell reported that a success was "Having a decent weekend", which involved doing some gardening with her son and cleaning around the house.  Kathleen Odonnell reported that her goal today is to work on cleaning her son's room.          Second Therapeutic Activity: Counselor introduced topic of stress management today.  Counselor provided definition of stress as feeling  tense, overwhelmed, worn out, and/or exhausted, and noted that in small amounts, stress can be motivating until things become too overwhelming to manage.  Counselor also explained how stress can be acute (brief but intense) or chronic (long-lasting) and this can impact the severity of symptoms one can experience in the physical, emotional, and behavioral categories.  Counselor inquired about members' specific stressors, how long they have been prevalent, and the various symptoms that tend to manifest as a result.  Counselor also offered several stress management strategies to help improve members' coping ability, including journaling, gratitude practice, relaxation techniques, and time management tips.  Counselor also explained that research has shown a strong support network composed of trusted family, friends, or community members can increase resilience in times of stress, and inquired about who members can reach out to for help in managing stressors.  Counselor encouraged members to consider discussing stressor 'red flags' with their close supports that can be monitored and strategies for assisting them in times of crisis.  Intervention was effective, as evidenced by Kathleen Odonnell actively participating in discussion on subject, reporting that her most significant stressors include family conflict, and money worries.  Kathleen Odonnell was able to identify several warning signs related to stress, including backache, sex drive lowered, compulsive eating, muscle tension, lack of energy, and sleep problems.  Kathleen Odonnell reported that her stress management goal is to practice relaxation skills 3 out of 7 days a week in order to reduce overall outbursts that are related to stress.  Kathleen Odonnell also expressed receptiveness to several stress management strategies practiced today in session, including deep breathing, and progressive muscle relaxation.     Assessment and Plan: Counselor  recommends that [name] remain in IOP treatment to better manage  mental health symptoms, ensure stability and pursue completion of treatment plan goals. Counselor recommends adherence to crisis/safety plan, taking medications as prescribed, and following up with medical professionals if any issues arise.    Follow Up Instructions: Counselor will send Webex link for session tomorrow.  Gene was advised to call back or seek an in-person evaluation if the symptoms worsen or if the condition fails to improve as anticipated.   Collaboration of Care:   Medication Management AEB [name], NP or Dr. [name]                                          Case Manager AEB [name], CNA    Patient/Guardian was advised Release of Information must be obtained prior to any record release in order to collaborate their care with an outside provider. Patient/Guardian was advised if they have not already done so to contact the registration department to sign all necessary forms in order for [place] to release information regarding their care.    Consent: Patient/Guardian gives verbal consent for treatment and assignment of benefits for services provided during this visit. Patient/Guardian expressed understanding and agreed to proceed.   I provided 180 minutes of non-face-to-face time during this encounter.   [name], [place], LCAS 03/27/23

## 2023-03-28 ENCOUNTER — Encounter (HOSPITAL_COMMUNITY): Payer: Self-pay | Admitting: Family

## 2023-03-28 ENCOUNTER — Other Ambulatory Visit (HOSPITAL_COMMUNITY): Admitting: Family

## 2023-03-28 DIAGNOSIS — F331 Major depressive disorder, recurrent, moderate: Secondary | ICD-10-CM

## 2023-03-28 DIAGNOSIS — F4310 Post-traumatic stress disorder, unspecified: Secondary | ICD-10-CM

## 2023-03-28 MED ORDER — TRAZODONE HCL 150 MG PO TABS: 150.0000 mg | ORAL_TABLET | Freq: Every day | ORAL | 0 refills | Status: DC

## 2023-03-28 NOTE — Progress Notes (Cosign Needed)
 Medication was refilled.

## 2023-03-28 NOTE — Progress Notes (Signed)
 Virtual Visit via Video Note   I connected with Kathleen Odonnell on 03/28/23 at  9:00 AM EDT by a video enabled telemedicine application and verified that I am speaking with the correct person using two identifiers.   At orientation to the IOP program, Case Manager discussed the limitations of evaluation and management by telemedicine and the availability of in person appointments. The patient expressed understanding and agreed to proceed with virtual visits throughout the duration of the program.   Location:  Patient: Patient Home Provider: [facility]   History of Present Illness: MDD and PTSD   Observations/Objective: Check In: Case Manager checked in with all participants to review discharge dates, insurance authorizations, work-related documents and needs from the treatment team regarding medications. Chalet stated needs and engaged in discussion.    Initial Therapeutic Activity: Counselor facilitated a check-in with Jalissa to assess for safety, sobriety and medication compliance.  Counselor also inquired about Aseel's current emotional ratings, as well as any significant changes in thoughts, feelings or behavior since previous check in.  Adaley presented for session on time and was alert, oriented x5, with no evidence or self-report of active SI/HI or A/V H.  Dorann reported compliance with medication and denied use of alcohol or illicit substances.  Eupha reported scores of 7/10 for depression, 10/10 for anxiety, and 10/10 for anger.  Jupiter denied any recent outbursts or panic attacks.  Shaliyah reported that a struggle was being notified today that a fraudulent check was cashed through her bank, which has her stressed.  Doria reported that a success was working on her son's room yesterday as she cleans up the house.  Tanessa reported that her goal today is to continue cleaning her son's room and doing laundry.          Second Therapeutic Activity: Counselor introduced [employer], [place]  Chaplain to provide psychoeducation on topic of Grief and Loss with members today.  [name] began discussion by checking in with the group about their baseline mood today, general thoughts on what grief means to them and how it has affected them personally in the past.  [name] provided information on how the process of grief/loss can differ depending upon one's unique culture, and categories of loss one could experience (i.e. loss of a person, animal, relationship, job, identity, etc).  [name] encouraged members to be mindful of how pervasive loss can be, and how to recognize signs which could indicate that this is having an impact on one's overall mental health and wellbeing.  Intervention effectiveness was mixed, as client did answer an 'icebreaker' question posed by chaplain, but didn't participate in discussion on topic of grief.    Third Therapeutic Activity: Counselor introduced topic of creating mental health maintenance plan today.  Counselor provided handout on subject to members, which stressed the importance of maintaining one's mental health in a similar way to using diet and exercise to ensure physical health.  Counselor walked members through process of identifying triggers which could worsen symptoms, including specific people, places, and things one needs to avoid.  Members were also tasked with identifying warning signs such as thoughts, feelings, or behaviors which could indicate mental health is at increased risk.  Counselor also facilitated conversation on self-care activities and coping strategies which members have previously utilized in the past, are currently using in daily routine, or plan to use soon to assist with managing problems or symptoms when/if they appear.  Counselor encouraged members to revisit their maintenance plan  often and make changes as needed to ensure day to day stability.  Intervention was effective, as evidenced by [name] participating in activity and creating a  comprehensive plan, including identification of triggers such as dealing with her ex-husband, being in crowded spaces with people too close to her, being alone, or having people raise their voice at her, and warning signs including feeling overwhelmed, experiencing outbursts, or yelling at people.  Valla also reported that she would make an effort to set aside time for self-care activities such as attending a line dancing class, gardening, or cleaning up her home to make the environment more orderly and use coping skills such as reciting positive affirmations, watching something funny on TV, and managing boundaries with 'toxic' people like her ex that impact depression and anxiety to manage stressors.    Assessment and Plan: Counselor recommends that Aislin remain in IOP treatment to better manage mental health symptoms, ensure stability and pursue completion of treatment plan goals. Counselor recommends adherence to crisis/safety plan, taking medications as prescribed, and following up with medical professionals if any issues arise.    Follow Up Instructions: Counselor will send Webex link for session tomorrow.  Maryum was advised to call back or seek an in-person evaluation if the symptoms worsen or if the condition fails to improve as anticipated.   Collaboration of Care:   Medication Management AEB [name], NP or Dr. [name]                                          Case Manager AEB [name], CNA    Patient/Guardian was advised Release of Information must be obtained prior to any record release in order to collaborate their care with an outside provider. Patient/Guardian was advised if they have not already done so to contact the registration department to sign all necessary forms in order for [place] to release information regarding their care.    Consent: Patient/Guardian gives verbal consent for treatment and assignment of benefits for services provided during this visit. Patient/Guardian  expressed understanding and agreed to proceed.   I provided 180 minutes of non-face-to-face time during this encounter.   [name], LCSW, LCAS 03/28/23

## 2023-03-29 ENCOUNTER — Other Ambulatory Visit (HOSPITAL_COMMUNITY): Admitting: Licensed Clinical Social Worker

## 2023-03-29 DIAGNOSIS — F331 Major depressive disorder, recurrent, moderate: Secondary | ICD-10-CM

## 2023-03-29 DIAGNOSIS — F4310 Post-traumatic stress disorder, unspecified: Secondary | ICD-10-CM
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
## 2023-03-30 ENCOUNTER — Other Ambulatory Visit (HOSPITAL_COMMUNITY): Admitting: Psychiatry

## 2023-03-30 DIAGNOSIS — F331 Major depressive disorder, recurrent, moderate: Secondary | ICD-10-CM

## 2023-03-30 DIAGNOSIS — F4310 Post-traumatic stress disorder, unspecified: Secondary | ICD-10-CM
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
## 2023-03-31 ENCOUNTER — Other Ambulatory Visit (HOSPITAL_COMMUNITY): Admitting: Psychiatry

## 2023-03-31 DIAGNOSIS — F331 Major depressive disorder, recurrent, moderate: Secondary | ICD-10-CM

## 2023-03-31 DIAGNOSIS — F4310 Post-traumatic stress disorder, unspecified: Secondary | ICD-10-CM
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
## 2023-04-03 ENCOUNTER — Ambulatory Visit (INDEPENDENT_AMBULATORY_CARE_PROVIDER_SITE_OTHER): Payer: 59 | Admitting: Internal Medicine

## 2023-04-03 ENCOUNTER — Other Ambulatory Visit (HOSPITAL_COMMUNITY): Admitting: Psychiatry

## 2023-04-03 ENCOUNTER — Telehealth (HOSPITAL_COMMUNITY): Payer: Self-pay | Admitting: Psychiatry

## 2023-04-04 ENCOUNTER — Encounter (HOSPITAL_COMMUNITY): Payer: Self-pay

## 2023-04-04 ENCOUNTER — Other Ambulatory Visit (HOSPITAL_COMMUNITY): Attending: Psychiatry | Admitting: Psychiatry

## 2023-04-04 ENCOUNTER — Telehealth (HOSPITAL_COMMUNITY): Admitting: Psychiatry

## 2023-04-04 DIAGNOSIS — Z630 Problems in relationship with spouse or partner: Secondary | ICD-10-CM | POA: Insufficient documentation

## 2023-04-04 DIAGNOSIS — F4310 Post-traumatic stress disorder, unspecified: Secondary | ICD-10-CM

## 2023-04-04 DIAGNOSIS — F324 Major depressive disorder, single episode, in partial remission: Secondary | ICD-10-CM | POA: Insufficient documentation

## 2023-04-04 DIAGNOSIS — F419 Anxiety disorder, unspecified: Secondary | ICD-10-CM | POA: Diagnosis not present

## 2023-04-04 DIAGNOSIS — F32A Depression, unspecified: Secondary | ICD-10-CM | POA: Diagnosis present

## 2023-04-04 DIAGNOSIS — F331 Major depressive disorder, recurrent, moderate: Secondary | ICD-10-CM

## 2023-04-04 NOTE — Progress Notes (Signed)
 Virtual Visit via Video Note   I connected with Kathleen Odonnell on 04/04/23 at  9:00 AM EDT by a video enabled telemedicine application and verified that I am speaking with the correct person using two identifiers.   At orientation to the IOP program, Case Manager discussed the limitations of evaluation and management by telemedicine and the availability of in person appointments. The patient expressed understanding and agreed to proceed with virtual visits throughout the duration of the program.   Location:  Patient: [facility] Home [facility]: [facility]   History of Present Illness: MDD and PTSD   Observations/Objective: Check In: Case Manager checked in with all participants to review discharge dates, insurance authorizations, work-related documents and needs from the treatment team regarding medications. Kathleen Odonnell stated needs and engaged in discussion.    Initial Therapeutic Activity: Counselor facilitated a check-in with Kathleen Odonnell to assess for safety, sobriety and medication compliance.  Counselor also inquired about Kathleen Odonnell's current emotional ratings, as well as any significant changes in thoughts, feelings or behavior since previous check in.  Kathleen Odonnell presented for session on time and was alert, oriented x5, with no evidence or self-report of active SI/HI or A/V H.  Kathleen Odonnell reported compliance with medication and denied use of alcohol or illicit substances.  Kathleen Odonnell reported scores of 7/10 for depression, 6/10 for anxiety, and 0/10 for anger/irritability.  Kathleen Odonnell denied any recent outbursts or panic attacks.  Kathleen Odonnell reported that a struggle was dealing with sickness yesterday, which led her to miss group.  Kathleen Odonnell reported that a success was taking time to rest and adjust her diet in order to recuperate.  Kathleen Odonnell reported that her goal today is to go to the bank to check on the status of her account and new card shipping.          Second Therapeutic Activity: Counselor introduced [employer], [place]  Chaplain to provide psychoeducation on topic of Grief and Loss with members today.  Kathleen Odonnell began discussion by checking in with the group about their baseline mood today, general thoughts on what grief means to them and how it has affected them personally in the past.  Kathleen Odonnell provided information on how the process of grief/loss can differ depending upon one's unique culture, and categories of loss one could experience (i.e. loss of a person, animal, relationship, job, identity, etc).  Kathleen Odonnell encouraged members to be mindful of how pervasive loss can be, and how to recognize signs which could indicate that this is having an impact on one's overall mental health and wellbeing.  Intervention effectiveness was mixed, as client did answer an 'icebreaker' question posed by chaplain, but didn't participate in discussion on topic of grief.    Third Therapeutic Activity: Counselor continued discussion upon topic of self-care today with group members.  Counselor virtually shared self-care assessment form with members via Webex to complete final sub-categories (i.e. social, spiritual, and professional).  Members were asked to rank their engagement in the activities listed for each dimension on a scale of 1-3, with 1 indicating 'Poor', 2 indicating 'Ok', and 3 indicating 'Well'.  Counselor invited members to share results of this assessment, and inquired about which areas of self-care they are doing well in, as well as areas that require attention, and how they plan to begin addressing this during treatment.  Intervention was effective, as evidenced by Kathleen Odonnell successfully completing final sections of assessment and actively engaging in discussion on subject, reporting that she is excelling in areas such as keeping up with  old friends that live far away, doing enjoyable activities with people, praying, meditating, and setting aside time for reflection, but would benefit from focusing more on areas such as spending time with a  romantic partner, meeting new people, and spending time in nature.  Kathleen Odonnell reported that she would work to improve self-care deficits by setting up a date with a new romantic partner, attending dance classes and other community events to meet new friends, and spending more time on her deck observing animals and other aspects of nature, which is calming to her.    Assessment and Plan: Counselor recommends that Kathleen Odonnell remain in IOP treatment to better manage mental health symptoms, ensure stability and pursue completion of treatment plan goals. Counselor recommends adherence to crisis/safety plan, taking medications as prescribed, and following up with medical professionals if any issues arise.    Follow Up Instructions: Counselor will send Webex link for session tomorrow.  Kathleen Odonnell was advised to call back or seek an in-person evaluation if the symptoms worsen or if the condition fails to improve as anticipated.   Collaboration of Care:   Medication Management AEB [name], NP or Dr. [name]                                          Case Manager AEB [name], CNA    Patient/Guardian was advised Release of Information must be obtained prior to any record release in order to collaborate their care with an outside provider. Patient/Guardian was advised if they have not already done so to contact the registration department to sign all necessary forms in order for [place] to release information regarding their care.    Consent: Patient/Guardian gives verbal consent for treatment and assignment of benefits for services provided during this visit. Patient/Guardian expressed understanding and agreed to proceed.   I provided 180 minutes of non-face-to-face time during this encounter.   [name], LCSW, LCAS 04/04/23

## 2023-04-05 ENCOUNTER — Other Ambulatory Visit (HOSPITAL_COMMUNITY): Payer: Self-pay | Admitting: Student

## 2023-04-05 ENCOUNTER — Other Ambulatory Visit (HOSPITAL_COMMUNITY): Admitting: Psychiatry

## 2023-04-05 DIAGNOSIS — F4310 Post-traumatic stress disorder, unspecified: Secondary | ICD-10-CM

## 2023-04-05 DIAGNOSIS — F331 Major depressive disorder, recurrent, moderate: Secondary | ICD-10-CM

## 2023-04-05 DIAGNOSIS — F324 Major depressive disorder, single episode, in partial remission: Secondary | ICD-10-CM | POA: Diagnosis not present

## 2023-04-05 MED ORDER — CITALOPRAM HYDROBROMIDE 40 MG PO TABS: 40.0000 mg | ORAL_TABLET | Freq: Every day | ORAL | 2 refills | Status: DC

## 2023-04-05 NOTE — Progress Notes (Signed)
 Virtual Visit via Video Note   I connected with Daylee R. Monroe-Leach on 04/05/23 at  9:00 AM EDT by a video enabled telemedicine application and verified that I am speaking with the correct person using two identifiers.   At orientation to the IOP program, Case Manager discussed the limitations of evaluation and management by telemedicine and the availability of in person appointments. The patient expressed understanding and agreed to proceed with virtual visits throughout the duration of the program.   Location:  Patient: Patient Home Provider: Home Office   History of Present Illness: MDD and PTSD   Observations/Objective: Check In: Case Manager checked in with all participants to review discharge dates, insurance authorizations, work-related documents and needs from the treatment team regarding medications. Sheneka stated needs and engaged in discussion.    Initial Therapeutic Activity: Counselor facilitated a check-in with Cherylyn to assess for safety, sobriety and medication compliance.  Counselor also inquired about Coryn's current emotional ratings, as well as any significant changes in thoughts, feelings or behavior since previous check in.  Sadey presented for session on time and was alert, oriented x5, with no evidence or self-report of active SI/HI or A/V H.  Tykeria reported compliance with medication and denied use of alcohol or illicit substances.  Denyla reported scores of 8/10 for depression, 5/10 for anxiety, and 0/10 for anger/irritability.  Mykelti denied any recent outbursts or panic attacks.  Darlys reported that a struggle was feeling sad this morning about some members that will be leaving the program, since she has grown attached.  Geraldean reported that a success was getting some issues cleared up with her bank yesterday, including changes to investments.  Soni reported that her goal today is to get a tire changed on her car.           Second Therapeutic Activity: Counselor introduced  Peggye Fothergill, [employer] Pharmacist, to provide psychoeducation on topic of medication compliance with members today.  Michelle Nasuti provided psychoeducation on classes of medications such as antidepressants, antipsychotics, what symptoms they are intended to treat, and any side effects one might encounter while on a particular prescription.  Time was allowed for clients to ask any questions they might have of [facility] regarding this specialty.  Intervention effectiveness could not be measured, as client did not participate.    Third Therapeutic Activity: Counselor covered topic of attachment styles today.  Counselor virtually shared a handout with the group on this topic which defined attachment styles as how people think about and behave in relationships.  Styles were broken down by category, including secure attachment where one believes close relationships are trustworthy, compared to insecure attachment (i.e. anxious, avoidant, or anxious-avoidant) where one is distrusting or worries about their bond with others.  Counselor inquired about which attachment style members most related to, how this has influenced their mental health/well-being, and whether they intend to begin making any changes.  Intervention was effective, as evidenced by [name] participating in discussion, and reporting that she most identified with the anxious attachment style due to traits such as being distrustful of her previous partner and the relationship, and sensitive of criticism and hungry for approval.  Myeisha reported that in the first 3 years of her marriage, she felt securely attached, but then his behavior drastically changed and she began to put her needs to the side to appease him.  Faythe this has led to consequences such as worsening depression, and low self-worth.  Selicia reported that she plans to continue with her divorce  from ex husband and use therapy to build up self-esteem and communication skills as she considers dating again, and  eventually fine a partner she can create secure attachment with.     Assessment and Plan: Counselor recommends that Kale remain in IOP treatment to better manage mental health symptoms, ensure stability and pursue completion of treatment plan goals. Counselor recommends adherence to crisis/safety plan, taking medications as prescribed, and following up with medical professionals if any issues arise.    Follow Up Instructions: Counselor will send Webex link for session tomorrow.  Yoona was advised to call back or seek an in-person evaluation if the symptoms worsen or if the condition fails to improve as anticipated.   Collaboration of Care:   Medication Management AEB [name], NP or Dr. Carlyn Reichert                                          Case Manager AEB [name], CNA    Patient/Guardian was advised Release of Information must be obtained prior to any record release in order to collaborate their care with an outside provider. Patient/Guardian was advised if they have not already done so to contact the registration department to sign all necessary forms in order for [place] to release information regarding their care.    Consent: Patient/Guardian gives verbal consent for treatment and assignment of benefits for services provided during this visit. Patient/Guardian expressed understanding and agreed to proceed.   I provided 180 minutes of non-face-to-face time during this encounter.   [name], LCSW, LCAS 04/05/23

## 2023-04-05 NOTE — Progress Notes (Signed)
 Received a refill request for Celexa 40 mg daily from IOP staff.  I called the patient, and this prescription was sent to the pharmacy.

## 2023-04-06 ENCOUNTER — Encounter (INDEPENDENT_AMBULATORY_CARE_PROVIDER_SITE_OTHER): Payer: Self-pay

## 2023-04-06 ENCOUNTER — Other Ambulatory Visit (HOSPITAL_COMMUNITY): Admitting: Psychiatry

## 2023-04-06 DIAGNOSIS — F331 Major depressive disorder, recurrent, moderate: Secondary | ICD-10-CM

## 2023-04-06 DIAGNOSIS — F4310 Post-traumatic stress disorder, unspecified: Secondary | ICD-10-CM

## 2023-04-06 DIAGNOSIS — F324 Major depressive disorder, single episode, in partial remission: Secondary | ICD-10-CM | POA: Diagnosis not present

## 2023-04-06 NOTE — Progress Notes (Signed)
 Virtual Visit via Video Note   I connected with Lekita R. Monroe-Leach on 04/06/23 at  9:00 AM EDT by a video enabled telemedicine application and verified that I am speaking with the correct person using two identifiers.   At orientation to the IOP program, Case Manager discussed the limitations of evaluation and management by telemedicine and the availability of in person appointments. The patient expressed understanding and agreed to proceed with virtual visits throughout the duration of the program.   Location:  Patient: Patient Home Provider: [facility] Office   History of Present Illness: MDD and PTSD   Observations/Objective: Check In: Case Manager checked in with all participants to review discharge dates, insurance authorizations, work-related documents and needs from the treatment team regarding medications. Akua stated needs and engaged in discussion.    Initial Therapeutic Activity: Counselor facilitated a check-in with Magdalina to assess for safety, sobriety and medication compliance.  Counselor also inquired about Aidynn's current emotional ratings, as well as any significant changes in thoughts, feelings or behavior since previous check in.  Neilah presented for session on time and was alert, oriented x5, with no evidence or self-report of active SI/HI or A/V H.  Luverne reported compliance with medication and denied use of alcohol or illicit substances.  Ivet reported scores of 5/10 for depression, 5/10 for anxiety, and 0/10 for anger/irritability.  Zephyr denied any recent outbursts or panic attacks.  Ameerah denied any new struggles.  Braniya reported that a success has been practicing her positive affirmations, which has led to an improvement in mood and outlook.  She reported that she also attended church for the first time in weeks.  Demeshia reported that her goal today is to go get her car fixed.          Second Therapeutic Activity: Counselor engaged the group in discussion on managing  work/life balance today to improve mental health and wellness.  Counselor explained how finding balance between responsibilities at home and work place can be challenging, and lead to increased stress.  Counselor facilitated discussion on what challenges members are currently, or have historically faced.  Counselor also discussed strategies for improving work/life balance while members work on their mental health during treatment.  Some of these included keeping track of time management; creating a list of priorities and scaling importance; setting realistic, measurable goals each day; establishing boundaries; taking care of health needs; and nurturing relationships at home and work for support.  Counselor inquired about areas where members feel they are excelling, as well as areas they could focus on during treatment. 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  Aarilyn reported that she experienced several symptoms of burnout, including feeling tired and drained, lowered immunity, self-doubt, outbursts, and isolating from others.  Danajah reported that there have also been numerous warning signs such as skipping breaks, not making herself available to members of her support network, feeling tired a lot, and lacking energy and motivation.  Tamatha was receptive to suggestions offered today for addressing work life imbalance, including prioritizing family time outside of her work schedule so she doesn't lose connection with important people like her son, keeping an activity time log for 1 week to build insight into work habits, prioritizing time in church at least once per week for spiritual support, and  making to do lists each day to improve daily routine and avoid overwhelming herself with too many  tasks.    Assessment and Plan: Counselor recommends that Emalea remain in IOP treatment to better manage mental health symptoms, ensure stability and pursue completion of treatment plan goals. Counselor recommends adherence to crisis/safety plan, taking medications as prescribed, and following up with medical professionals if any issues arise.    Follow Up Instructions: Counselor will send Webex link for session tomorrow.  Renee was advised to call back or seek an in-person evaluation if the symptoms worsen or if the condition fails to improve as anticipated.   Collaboration of Care:   Medication Management AEB [name], NP or Dr. Carlyn Reichert                                          Case Manager AEB [name], CNA    Patient/Guardian was advised Release of Information must be obtained prior to any record release in order to collaborate their care with an outside provider. Patient/Guardian was advised if they have not already done so to contact the registration department to sign all necessary forms in order for [place] to release information regarding their care.    Consent: Patient/Guardian gives verbal consent for treatment and assignment of benefits for services provided during this visit. Patient/Guardian expressed understanding and agreed to proceed.   I provided 180 minutes of non-face-to-face time during this encounter.   [name], LCSW, LCAS 04/06/23

## 2023-04-07 ENCOUNTER — Other Ambulatory Visit (HOSPITAL_COMMUNITY): Admitting: Psychiatry

## 2023-04-07 DIAGNOSIS — F4310 Post-traumatic stress disorder, unspecified: Secondary | ICD-10-CM

## 2023-04-07 DIAGNOSIS — F331 Major depressive disorder, recurrent, moderate: Secondary | ICD-10-CM

## 2023-04-07 DIAGNOSIS — F324 Major depressive disorder, single episode, in partial remission: Secondary | ICD-10-CM | POA: Diagnosis not present

## 2023-04-07 NOTE — Progress Notes (Signed)
 Virtual Visit via Video Note   I connected with Kathleen Odonnell on 04/07/23 at  9:00 AM EDT by a video enabled telemedicine application and verified that I am speaking with the correct person using two identifiers.   At orientation to the IOP program, Case Manager discussed the limitations of evaluation and management by telemedicine and the availability of in person appointments. The patient expressed understanding and agreed to proceed with virtual visits throughout the duration of the program.   Location:  Patient: Patient Home Provider: Home Office   History of Present Illness: MDD and PTSD   Observations/Objective: Check In: Case Manager checked in with all participants to review discharge dates, insurance authorizations, work-related documents and needs from the treatment team regarding medications. Kathleen Odonnell stated needs and engaged in discussion.    Initial Therapeutic Activity: Counselor facilitated a check-in with Kathleen Odonnell to assess for safety, sobriety and medication compliance.  Counselor also inquired about Kathleen Odonnell's current emotional ratings, as well as any significant changes in thoughts, feelings or behavior since previous check in.  Kathleen Odonnell presented for session on time and was alert, oriented x5, with no evidence or self-report of active SI/HI or A/V H.  Kathleen Odonnell reported compliance with medication and denied use of alcohol or illicit substances.  Kathleen Odonnell reported scores of 5/10 for depression, 5/10 for anxiety, and 0/10 for anger/irritability.  Kathleen Odonnell denied any recent outbursts or panic attacks.  Kathleen Odonnell reported that a struggle was having to get a new tire for her car yesterday due to a leak.  Kathleen Odonnell reported that a success was treating herself to [facility] for dinner.  Kathleen Odonnell reported that her goal this weekend is to spend some time in her garden for self-care, and check on her mother.          Second Therapeutic Activity: Counselor introduced topic of mental illness stigma today.  Counselor  explained how stigma is defined as someone viewing another person in a negative way due to distinguishing characteristics or traits which are thought to be a disadvantage, including negative beliefs or attitudes associated with people who have a mental health condition such as major depression and/or generalized anxiety.  Counselor explained that stigma can lead to discrimination as well, and the harmful effects of pervasive stigma include reluctance to seek help or treatment, lack of understanding by family, friends, co-workers or peers, bullying/harassment, or negative impact on one's perceived ability to overcome challenges or succeed in life.  Counselor offered several suggestions for coping with stigma regarding mental illness, including staying engaged in treatment through linkage with a therapist, psychiatrist, and/or support group, avoiding isolation, and speaking out against stigma when encountered to reverse societal impact and increase acceptance and understanding.  Counselor inquired about members' experiences with this issue, as well as what they have done to minimize the impact it can have on their lives.  Intervention was effective, as evidenced by Kathleen Odonnell actively engaging in discussion on the subject, and completing a stigma screening, which revealed a score of severe stigma toward mental health. Kathleen Odonnell reported that she struggled with mental health issues for much of her life, but didn't reach out for help until adulthood, which things became too overwhelming and she could no longer compartmentalize.  Kathleen Odonnell reported that she was initially surprised that she scored so low on screening, but recognized that mixed messages from family growing up could have influenced this, stating "My mom told me to just think positive thoughts and keep going".  Kathleen Odonnell reported that she has also experienced  discrimination against mental health in the workplace, but has tried to normalize the subject with people she sees  struggling at times, including students with concerning behaviors like self-harm.          Assessment and Plan: Counselor recommends that Kathleen Odonnell remain in IOP treatment to better manage mental health symptoms, ensure stability and pursue completion of treatment plan goals. Counselor recommends adherence to crisis/safety plan, taking medications as prescribed, and following up with medical professionals if any issues arise.    Follow Up Instructions: Counselor will send Webex link for session tomorrow.  Kathleen Odonnell was advised to call back or seek an in-person evaluation if the symptoms worsen or if the condition fails to improve as anticipated.   Collaboration of Care:   Medication Management AEB [name], NP or Dr. Carlyn Reichert                                          Case Manager AEB [name], CNA    Patient/Guardian was advised Release of Information must be obtained prior to any record release in order to collaborate their care with an outside provider. Patient/Guardian was advised if they have not already done so to contact the registration department to sign all necessary forms in order for [place] to release information regarding their care.    Consent: Patient/Guardian gives verbal consent for treatment and assignment of benefits for services provided during this visit. Patient/Guardian expressed understanding and agreed to proceed.   I provided 180 minutes of non-face-to-face time during this encounter.   [name], LCSW, LCAS 04/07/23

## 2023-04-10 ENCOUNTER — Other Ambulatory Visit (HOSPITAL_COMMUNITY): Admitting: Psychiatry

## 2023-04-10 DIAGNOSIS — F331 Major depressive disorder, recurrent, moderate: Secondary | ICD-10-CM

## 2023-04-10 DIAGNOSIS — F324 Major depressive disorder, single episode, in partial remission: Secondary | ICD-10-CM | POA: Diagnosis not present

## 2023-04-10 DIAGNOSIS — F4310 Post-traumatic stress disorder, unspecified: Secondary | ICD-10-CM

## 2023-04-10 NOTE — Progress Notes (Signed)
 Virtual Visit via Video Note   I connected with Kathleen Odonnell on 04/10/23 at  9:00 AM EDT by a video enabled telemedicine application and verified that I am speaking with the correct person using two identifiers.   At orientation to the IOP program, Case Manager discussed the limitations of evaluation and management by telemedicine and the availability of in person appointments. The patient expressed understanding and agreed to proceed with virtual visits throughout the duration of the program.   Location:  Patient: Patient Home Provider: Home Office   History of Present Illness: MDD and PTSD   Observations/Objective: Check In: Case Manager checked in with all participants to review discharge dates, insurance authorizations, work-related documents and needs from the treatment team regarding medications. Kathleen Odonnell stated needs and engaged in discussion.    Initial Therapeutic Activity: Counselor facilitated a check-in with Kathleen Odonnell to assess for safety, sobriety and medication compliance.  Counselor also inquired about Kathleen Odonnell's current emotional ratings, as well as any significant changes in thoughts, feelings or behavior since previous check in.  Kathleen Odonnell presented for session on time and was alert, oriented x5, with no evidence or self-report of active SI/HI or A/V H.  Kathleen Odonnell reported compliance with medication and denied use of alcohol or illicit substances.  Kathleen Odonnell reported scores of 3/10 for depression, 3/10 for anxiety, and 0/10 for anger/irritability.  Kathleen Odonnell denied any recent outbursts or panic attacks.  Kathleen Odonnell denied any new struggles.  Kathleen Odonnell reported that a success was running errands with her mother, son and friends this weekend.  Kathleen Odonnell reported that her goal today is to binge watch some of her favorite shows since it will by rainy outside.          Second Therapeutic Activity: Counselor covered topic of conflict resolution today.  Counselor virtually shared a handout on subject with members which  warned against the 'four horsemen' of communication traps that should be avoided due to tendency to escalate and damage a relationship.  These included criticism, defensiveness, contempt, and stonewalling.  'Antidotes' to these harmful behaviors were offered as healthy replacements to improve communication and understanding, including approaching problems with a gentle startup approach, taking responsibility for one's behavior, sharing fondness/admiration, and using self-soothing to calm down and focus on the problem at hand.  Counselor encouraged members to share recent experiences with conflict that they have faced, which approach they utilized, and any changes that they would plan to implement in order to improve overall conflict resolution skills.  Intervention was effective, as evidenced by Kathleen Odonnell actively participating in discussion on topic, reporting that she typically resorted to stonewalling when issues arose in the previous relationship.  Kathleen Odonnell reported that when "Pushed to a point", she can become contemptuous, but tries to avoid this as much as possible.  She acknowledged that criticism is not often aimed at others, and tends to be internalized, stating "I'm my own worst critic".  Kathleen Odonnell expressed receptiveness to alternative strategies offered in order to improve conflict resolution skills, including becoming more assertive through practice of "I" statements, and taking timeouts when feeling overwhelmed to use methods such as boxing, mantras, or music to calm down.    Assessment and Plan: Counselor recommends that Kathleen Odonnell remain in IOP treatment to better manage mental health symptoms, ensure stability and pursue completion of treatment plan goals. Counselor recommends adherence to crisis/safety plan, taking medications as prescribed, and following up with medical professionals if any issues arise.    Follow Up Instructions: Counselor will send Webex link for  session tomorrow.  Kathleen Odonnell was advised to  call back or seek an in-person evaluation if the symptoms worsen or if the condition fails to improve as anticipated.   Collaboration of Care:   Medication Management AEB [name], NP or Dr. Carlyn Reichert                                          Case Manager AEB [name], CNA    Patient/Guardian was advised Release of Information must be obtained prior to any record release in order to collaborate their care with an outside provider. Patient/Guardian was advised if they have not already done so to contact the registration department to sign all necessary forms in order for [place] to release information regarding their care.    Consent: Patient/Guardian gives verbal consent for treatment and assignment of benefits for services provided during this visit. Patient/Guardian expressed understanding and agreed to proceed.   I provided 180 minutes of non-face-to-face time during this encounter.   [name], LCSW, LCAS 04/10/23

## 2023-04-11 ENCOUNTER — Other Ambulatory Visit (HOSPITAL_COMMUNITY): Admitting: Psychiatry

## 2023-04-11 DIAGNOSIS — F331 Major depressive disorder, recurrent, moderate: Secondary | ICD-10-CM

## 2023-04-11 DIAGNOSIS — F324 Major depressive disorder, single episode, in partial remission: Secondary | ICD-10-CM | POA: Diagnosis not present

## 2023-04-11 DIAGNOSIS — F4310 Post-traumatic stress disorder, unspecified: Secondary | ICD-10-CM

## 2023-04-11 NOTE — Progress Notes (Signed)
 Virtual Visit via Video Note   I connected with Kathleen Odonnell on 04/11/23 at  9:00 AM EDT by a video enabled telemedicine application and verified that I am speaking with the correct person using two identifiers.   At orientation to the IOP program, Case Manager discussed the limitations of evaluation and management by telemedicine and the availability of in person appointments. The patient expressed understanding and agreed to proceed with virtual visits throughout the duration of the program.   Location:  Patient: Patient Home Provider: [facility]   History of Present Illness: MDD and PTSD   Observations/Objective: Check In: Case Manager checked in with all participants to review discharge dates, insurance authorizations, work-related documents and needs from the treatment team regarding medications. Kathleen Odonnell stated needs and engaged in discussion.    Initial Therapeutic Activity: Counselor facilitated a check-in with Kathleen Odonnell to assess for safety, sobriety and medication compliance.  Counselor also inquired about Kathleen Odonnell's current emotional ratings, as well as any significant changes in thoughts, feelings or behavior since previous check in.  Kathleen Odonnell presented for session on time and was alert, oriented x5, with no evidence or self-report of active SI/HI or A/V H.  Kathleen Odonnell reported compliance with medication and denied use of alcohol or illicit substances.  Kathleen Odonnell reported scores of 3/10 for depression, 3/10 for anxiety, and 0/10 for anger/irritability.  Kathleen Odonnell denied any recent outbursts or panic attacks.  Kathleen Odonnell denied any new struggles.  Kathleen Odonnell reported that a success was watching some movies for self-care yesterday, in addition to doing laundry.  Kathleen Odonnell reported that her goal today is to take her mother out to run some errands.            Second Therapeutic Activity: Counselor introduced [employer], [place] Chaplain to provide psychoeducation on topic of Grief and Loss with members today.   Kathleen Odonnell began discussion by checking in with the group about their baseline mood today, general thoughts on what grief means to them and how it has affected them personally in the past.  Kathleen Odonnell provided information on how the process of grief/loss can differ depending upon one's unique culture, and categories of loss one could experience (i.e. loss of a person, animal, relationship, job, identity, etc).  Kathleen Odonnell encouraged members to be mindful of how pervasive loss can be, and how to recognize signs which could indicate that this is having an impact on one's overall mental health and wellbeing.  Intervention effectiveness was mixed, as client did answer an 'icebreaker' question posed by chaplain, but didn't participate in discussion on topic of grief.    Third Therapeutic Activity: Counselor introduced topic of anger management today.  Counselor virtually shared a handout with members on this subject featuring a variety of coping skills, and facilitated discussion on these approaches.  Examples included raising awareness of anger triggers, practicing deep breathing, keeping an anger log to better understand episodes, using diversion activities to distract oneself for 30 minutes, taking a time out when necessary, and being mindful of warning signs tied to thoughts or behavior.  Counselor inquired about which techniques group members have used before, what has proved to be helpful, what their unique warning signs might be, as well as what they will try out in the future to assist with de-escalation.  Intervention was effective, as evidenced by Kathleen Odonnell participating in discussion on activity, and reporting that repressed anger has caused physical and mental health problems for her, including worsening depression and a broken hand at one point when  she hit something.  She reported that she also had an outburst at work before she went on leave, and this was uncharacteristic for her.  Kathleen Odonnell reported that her triggers  include family conflict, hearing about a family member being mistreated, or work stress.  Kathleen Odonnell reported that warning signs include tearfulness, muscle tension, clenching her fists, getting quiet, and aggression.  Kathleen Odonnell reported that she will work to manage anger more effectively by asserting herself when someone has done something that triggers her, or practicing coping skills such as boxing to channel this emotion in a healthy way.  Assessment and Plan: Counselor recommends that Kathleen Odonnell remain in IOP treatment to better manage mental health symptoms, ensure stability and pursue completion of treatment plan goals. Counselor recommends adherence to crisis/safety plan, taking medications as prescribed, and following up with medical professionals if any issues arise.    Follow Up Instructions: Counselor will send Webex link for session tomorrow.  Kathleen Odonnell was advised to call back or seek an in-person evaluation if the symptoms worsen or if the condition fails to improve as anticipated.   Collaboration of Care:   Medication Management AEB [name], NP or Dr. Carlyn Reichert                                          Case Manager AEB [name], CNA    Patient/Guardian was advised Release of Information must be obtained prior to any record release in order to collaborate their care with an outside provider. Patient/Guardian was advised if they have not already done so to contact the registration department to sign all necessary forms in order for [place] to release information regarding their care.    Consent: Patient/Guardian gives verbal consent for treatment and assignment of benefits for services provided during this visit. Patient/Guardian expressed understanding and agreed to proceed.   I provided 180 minutes of non-face-to-face time during this encounter.   [name], LCSW, LCAS 04/11/23

## 2023-04-12 ENCOUNTER — Other Ambulatory Visit (HOSPITAL_COMMUNITY): Admitting: Psychiatry

## 2023-04-12 DIAGNOSIS — F4310 Post-traumatic stress disorder, unspecified: Secondary | ICD-10-CM

## 2023-04-12 DIAGNOSIS — F324 Major depressive disorder, single episode, in partial remission: Secondary | ICD-10-CM | POA: Diagnosis not present

## 2023-04-12 DIAGNOSIS — F331 Major depressive disorder, recurrent, moderate: Secondary | ICD-10-CM

## 2023-04-12 NOTE — Progress Notes (Signed)
 Virtual Visit via Video Note   I connected with Kathleen Odonnell on 04/12/23 at  9:00 AM EDT by a video enabled telemedicine application and verified that I am speaking with the correct person using two identifiers.   At orientation to the IOP program, Case Manager discussed the limitations of evaluation and management by telemedicine and the availability of in person appointments. The patient expressed understanding and agreed to proceed with virtual visits throughout the duration of the program.   Location:  Patient: Patient Home Provider: Home Office   History of Present Illness: MDD and PTSD   Observations/Objective: Check In: Case Manager checked in with all participants to review discharge dates, insurance authorizations, work-related documents and needs from the treatment team regarding medications. Kathleen Odonnell stated needs and engaged in discussion.    Initial Therapeutic Activity: Counselor facilitated a check-in with Kathleen Odonnell to assess for safety, sobriety and medication compliance.  Counselor also inquired about Kathleen Odonnell's current emotional ratings, as well as any significant changes in thoughts, feelings or behavior since previous check in.  Kathleen Odonnell presented for session on time and was alert, oriented x5, with no evidence or self-report of active SI/HI or A/V H.  Kathleen Odonnell reported compliance with medication and denied use of alcohol or illicit substances.  Kathleen Odonnell reported scores of 2/10 for depression, 2/10 for anxiety, and 0/10 for anger/irritability.  Kathleen Odonnell denied any recent outbursts or panic attacks.  Kathleen Odonnell denied any new struggles.  Kathleen Odonnell reported that a success was taking her mother out to run errands yesterday, and then watching movies at home.  Kathleen Odonnell reported that her goal today is to meet up with some students on Teams and discuss some books they have been reading.  She reported that she will also attend church in the evening.          Second Therapeutic Activity: Counselor introduced topic  of self-care today.  Counselor explained how this can be defined as the things one does to maintain good health and improve well-being.  Counselor provided members with a self-care assessment form to complete.  This handout featured various sub-categories of self-care, including physical, psychological/emotional, social, spiritual, and professional.  Members were asked to rank their engagement in the activities listed for each dimension on a scale of 1-3, with 1 indicating 'Poor', 2 indicating 'Ok', and 3 indicating 'Well'.  Counselor invited members to share results of their assessment, and inquired about which areas of self-care they are doing well in, as well as areas that require attention, and how they plan to begin addressing this during treatment.  Intervention was effective, as evidenced by Kathleen Odonnell revisiting initial 2 sections of assessment and actively engaging in discussion on subject, reporting that she has been improving in areas such as finding reasons to laugh, expressing her feelings in healthy ways, and learning new things unrelated to work, but would benefit from focusing more on areas such as eating healthy foods, getting away from distractions, and recognizing her own strengths and achievements.  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  Kathleen Odonnell stated "I love self-care.  It has helped to get my butt out of the house and say yes to my friends more often when they suggest making plans together".    Assessment and Plan:  Counselor recommends that Kathleen Odonnell remain in IOP treatment to better manage mental health symptoms, ensure stability and pursue completion of treatment plan goals. Counselor recommends adherence to crisis/safety plan, taking  medications as prescribed, and following up with medical professionals if any issues arise.    Follow Up Instructions: Counselor will send Webex link for session tomorrow.  Kathleen Odonnell was advised to call back or seek an in-person evaluation if the symptoms worsen or if the condition fails to improve as anticipated.   Collaboration of Care:   Medication Management AEB [name], NP or Dr. Carlyn Reichert                                          Case Manager AEB [name], CNA    Patient/Guardian was advised Release of Information must be obtained prior to any record release in order to collaborate their care with an outside provider. Patient/Guardian was advised if they have not already done so to contact the registration department to sign all necessary forms in order for [place] to release information regarding their care.    Consent: Patient/Guardian gives verbal consent for treatment and assignment of benefits for services provided during this visit. Patient/Guardian expressed understanding and agreed to proceed.   I provided 180 minutes of non-face-to-face time during this encounter.   [name], LCSW, LCAS 04/12/23

## 2023-04-13 ENCOUNTER — Other Ambulatory Visit (HOSPITAL_COMMUNITY): Admitting: Psychiatry

## 2023-04-13 DIAGNOSIS — F331 Major depressive disorder, recurrent, moderate: Secondary | ICD-10-CM

## 2023-04-13 DIAGNOSIS — F324 Major depressive disorder, single episode, in partial remission: Secondary | ICD-10-CM | POA: Diagnosis not present

## 2023-04-13 DIAGNOSIS — F4310 Post-traumatic stress disorder, unspecified: Secondary | ICD-10-CM

## 2023-04-13 NOTE — Patient Instructions (Signed)
 D:  Patient will complete virtual MH-IOP tomorrow (04-14-23).  A:  Discharge.  Follow up with Dr. Lolly Mustache on 04-18-23 @ 11:40 a.m (video) and patient's therapist [name], [facility]) will reach out to her re: appointment.  Strongly encouraged support groups through [employer] and a Recent Separation/Divorce Recovery Support group.  Return to work on 04-24-23; without any restrictions.  R:  Patient receptive.

## 2023-04-13 NOTE — Progress Notes (Signed)
 Virtual Visit via Video Note   I connected with Kathleen Odonnell on 04/13/23 at  9:00 AM EDT by a video enabled telemedicine application and verified that I am speaking with the correct person using two identifiers.   At orientation to the IOP program, Case Manager discussed the limitations of evaluation and management by telemedicine and the availability of in person appointments. The patient expressed understanding and agreed to proceed with virtual visits throughout the duration of the program.   Location:  Patient: Patient Home Provider: [facility]   History of Present Illness: MDD and PTSD   Observations/Objective: Check In: Case Manager checked in with all participants to review discharge dates, insurance authorizations, work-related documents and needs from the treatment team regarding medications. Kathleen Odonnell stated needs and engaged in discussion.    Initial Therapeutic Activity: Counselor facilitated a check-in with Kathleen Odonnell to assess for safety, sobriety and medication compliance.  Counselor also inquired about Kathleen Odonnell's current emotional ratings, as well as any significant changes in thoughts, feelings or behavior since previous check in.  Kathleen Odonnell presented for session on time and was alert, oriented x5, with no evidence or self-report of active SI/HI or A/V H.  Kathleen Odonnell reported compliance with medication and denied use of alcohol or illicit substances.  Kathleen Odonnell reported scores of 2/10 for depression, 0/10 for anxiety, and 0/10 for anger/irritability.  Kathleen Odonnell denied any recent outbursts or panic attacks.  Kathleen Odonnell denied any new struggles.  Kathleen Odonnell reported that a success was making some calls yesterday to check on the status of her social security check and resolution of bank issues.  Kathleen Odonnell reported that she also attended a class at church regarding tax information that could save money in the future.  Kathleen Odonnell reported that her goal today is to finish up an art project she intends to gift to her daughter.           Second Therapeutic Activity: Counselor introduced topic of grounding skills today.  Counselor defined these as simple strategies one can use to help detach from difficult thoughts or feelings temporarily by focusing on something else.  Counselor noted that grounding will not solve the problem at hand, but can provide the practitioner with time to regain control over their thoughts and/or feelings and prevent the situation from getting worse (i.e. interrupting a panic attack).  Counselor divided these into three categories (mental, physical, and soothing) and then provided examples of each which group members could practice during session.  Some of these included describing one's environment in detail or playing a categories game with oneself for mental category, taking a hot bath/shower, stretching, or carrying a grounding object for physical category, and saying kind statements, or visualizing people one cares about for soothing category.  Counselor inquired about which techniques members have used with success in the past, or will commit to learning, practicing, and applying now to improve coping abilities.  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    Assessment and Plan: Counselor recommends that [name] remain in IOP treatment to better manage mental health  symptoms, ensure stability and pursue completion of treatment plan goals. Counselor recommends adherence to crisis/safety plan, taking medications as prescribed, and following up with medical professionals if any issues arise.    Follow Up Instructions: Counselor will send Webex link for session tomorrow.  Kathleen Odonnell was advised to call back or seek an in-person evaluation if the symptoms worsen or if the condition fails to improve as anticipated.   Collaboration of Care:   Medication Management AEB Kathleen Jacks, NP or Dr. Carlyn Odonnell                                          Case Manager AEB Kathleen Modena, CNA    Patient/Guardian was advised Release of Information must be obtained prior to any record release in order to collaborate their care with an outside provider. Patient/Guardian was advised if they have not already done so to contact the registration department to sign all necessary forms in order for [place] to release information regarding their care.    Consent: Patient/Guardian gives verbal consent for treatment and assignment of benefits for services provided during this visit. Patient/Guardian expressed understanding and agreed to proceed.   I provided 180 minutes of non-face-to-face time during this encounter.   Kathleen Stain, LCSW, LCAS 04/13/23

## 2023-04-14 ENCOUNTER — Other Ambulatory Visit (HOSPITAL_COMMUNITY): Admitting: Psychiatry

## 2023-04-14 DIAGNOSIS — F4310 Post-traumatic stress disorder, unspecified: Secondary | ICD-10-CM

## 2023-04-14 DIAGNOSIS — F331 Major depressive disorder, recurrent, moderate: Secondary | ICD-10-CM

## 2023-04-14 DIAGNOSIS — F324 Major depressive disorder, single episode, in partial remission: Secondary | ICD-10-CM | POA: Diagnosis not present

## 2023-04-14 NOTE — Progress Notes (Signed)
 Virtual Visit via Video Note  I connected with Betzayda R Monroe-Leach on 04/14/23 at  9:00 AM EDT by a video enabled telemedicine application and verified that I am speaking with the correct person using two identifiers.  Location: Patient: Home Provider: Office   I discussed the limitations of evaluation and management by telemedicine and the availability of in person appointments. The patient expressed understanding and agreed to proceed.   I discussed the assessment and treatment plan with the patient. The patient was provided an opportunity to ask questions and all were answered. The patient agreed with the plan and demonstrated an understanding of the instructions.   The patient was advised to call back or seek an in-person evaluation if the symptoms worsen or if the condition fails to improve as anticipated.  I provided 15 minutes of non-face-to-face time during this encounter.   [name], NP   [facility] Discharge Summary  AISSA LISOWSKI [id number]  Admission date: 03/13/2023 Discharge date: 04/14/2023  Reason for admission: Per initial admission assessment note: " This is a 53 yr old separated, employed, African American female who was referred per therapist [name], [facility]); treatment for worsening depressive and anxiety symptoms.  Pt was previously in MH-IOP April 2022 d/t panic attacks and depression.  Triggers:  1)  Marriage of twelve yrs ended on 12-31-22.  Pt had renewed vows while she was in IOP previously.  "My husband left me and the kids (50 yr old engaged daugher and 64 yr old son)."  According to pt he took money from their joint account.  "I went from just paying the electric bill to now having to pay all the bills, including ones he created jointly."  Pt states he stops by the home once a week to get his mail.  "My father says he does that to manipulate me."  Pt admits to having to drink a shot of ETOH whenever he  leaves.  Pt confirms it's only one shot."   Progress in Program Toward Treatment Goals: Progressing patient attended and participated with daily group session with active and engaged participation.  Denying any suicidal or homicidal ideations at discharge.  Reports she has been taking and tolerating medications well.  Patient is currently followed by psychiatrist Arfeen and therapist McCammon.  She reports upcoming follow-up appointments no other concerns noted at this visit.  Reports plans to establish mental health virtual inspiration website.  Support, encouragement and reassurance was provided.  Progress (rationale): Take all of you medications as prescribed by your mental healthcare provider.  Report any adverse effects and reactions from your medications to your outpatient provider promptly.  Do not engage in alcohol and or illegal drug use while on prescription medicines. Keep all scheduled appointments. This is to ensure that you are getting refills on time and to avoid any interruption in your medication.  If you are unable to keep an appointment call to reschedule.  Be sure to follow up with resources and follow ups given. In the event of worsening symptoms call the crisis hotline, 911, and or go to the nearest emergency department for appropriate evaluation and treatment of symptoms. Follow-up with your primary care provider for your medical issues, concerns and or health care needs.    Collaboration of Care: Medication Management AEB continue medications as indicated  Patient/Guardian was advised Release of Information must be obtained prior to any record release in order to collaborate their care with an outside provider. Patient/Guardian  was advised if they have not already done so to contact the registration department to sign all necessary forms in order for [place] to release information regarding their care.   Consent: Patient/Guardian gives verbal consent for treatment and assignment of  benefits for services provided during this visit. Patient/Guardian expressed understanding and agreed to proceed.   [name] NP  04/14/2023
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
## 2023-04-17 ENCOUNTER — Ambulatory Visit (HOSPITAL_COMMUNITY): Admitting: Psychiatry

## 2023-04-18 ENCOUNTER — Encounter (HOSPITAL_COMMUNITY): Payer: Self-pay | Admitting: Psychiatry

## 2023-04-18 ENCOUNTER — Telehealth (HOSPITAL_COMMUNITY): Admitting: Psychiatry

## 2023-04-18 VITALS — Wt 201.0 lb

## 2023-04-18 DIAGNOSIS — F902 Attention-deficit hyperactivity disorder, combined type: Secondary | ICD-10-CM | POA: Diagnosis not present

## 2023-04-18 DIAGNOSIS — F331 Major depressive disorder, recurrent, moderate: Secondary | ICD-10-CM

## 2023-04-18 DIAGNOSIS — F4310 Post-traumatic stress disorder, unspecified: Secondary | ICD-10-CM

## 2023-04-18 MED ORDER — TRAZODONE HCL 150 MG PO TABS: 150.0000 mg | ORAL_TABLET | Freq: Every day | ORAL | 1 refills | Status: DC

## 2023-04-18 MED ORDER — BUPROPION HCL ER (XL) 300 MG PO TB24: 300.0000 mg | ORAL_TABLET | Freq: Every day | ORAL | 1 refills | Status: DC

## 2023-04-18 MED ORDER — HYDROXYZINE PAMOATE 25 MG PO CAPS: 25.0000 mg | ORAL_CAPSULE | Freq: Every evening | ORAL | 0 refills | Status: DC | PRN
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
## 2023-05-15 ENCOUNTER — Other Ambulatory Visit: Payer: Self-pay | Admitting: Family Medicine

## 2023-05-15 DIAGNOSIS — Z1231 Encounter for screening mammogram for malignant neoplasm of breast: Secondary | ICD-10-CM

## 2023-05-16 ENCOUNTER — Ambulatory Visit
Admission: RE | Admit: 2023-05-16 | Discharge: 2023-05-16 | Disposition: A | Discharge status: Home / Self Care | Payer: Self-pay | Source: Ambulatory Visit | Attending: Family Medicine | Admitting: Family Medicine

## 2023-05-16 DIAGNOSIS — Z1231 Encounter for screening mammogram for malignant neoplasm of breast: Secondary | ICD-10-CM

## 2023-05-30 ENCOUNTER — Telehealth (HOSPITAL_BASED_OUTPATIENT_CLINIC_OR_DEPARTMENT_OTHER): Admitting: Psychiatry

## 2023-05-30 ENCOUNTER — Encounter (HOSPITAL_COMMUNITY): Payer: Self-pay | Admitting: Psychiatry

## 2023-05-30 DIAGNOSIS — F331 Major depressive disorder, recurrent, moderate: Secondary | ICD-10-CM | POA: Diagnosis not present

## 2023-05-30 DIAGNOSIS — F4310 Post-traumatic stress disorder, unspecified: Secondary | ICD-10-CM

## 2023-05-30 DIAGNOSIS — F902 Attention-deficit hyperactivity disorder, combined type: Secondary | ICD-10-CM

## 2023-05-30 MED ORDER — CITALOPRAM HYDROBROMIDE 40 MG PO TABS: 40.0000 mg | ORAL_TABLET | Freq: Every day | ORAL | 2 refills | Status: DC

## 2023-05-30 MED ORDER — TRAZODONE HCL 150 MG PO TABS: 150.0000 mg | ORAL_TABLET | Freq: Every day | ORAL | 2 refills | Status: DC

## 2023-05-30 MED ORDER — BUPROPION HCL ER (XL) 300 MG PO TB24: 300.0000 mg | ORAL_TABLET | Freq: Every day | ORAL | 2 refills | Status: DC
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
## 2023-08-21 ENCOUNTER — Telehealth (HOSPITAL_BASED_OUTPATIENT_CLINIC_OR_DEPARTMENT_OTHER): Admitting: Psychiatry

## 2023-08-21 ENCOUNTER — Encounter (HOSPITAL_COMMUNITY): Payer: Self-pay | Admitting: Psychiatry

## 2023-08-21 DIAGNOSIS — F331 Major depressive disorder, recurrent, moderate: Secondary | ICD-10-CM

## 2023-08-21 DIAGNOSIS — F4310 Post-traumatic stress disorder, unspecified: Secondary | ICD-10-CM

## 2023-08-21 DIAGNOSIS — F902 Attention-deficit hyperactivity disorder, combined type: Secondary | ICD-10-CM | POA: Diagnosis not present

## 2023-08-21 MED ORDER — CITALOPRAM HYDROBROMIDE 40 MG PO TABS: 40.0000 mg | ORAL_TABLET | Freq: Every day | ORAL | 2 refills | Status: DC

## 2023-08-21 MED ORDER — BUPROPION HCL ER (XL) 300 MG PO TB24: 300.0000 mg | ORAL_TABLET | Freq: Every day | ORAL | 2 refills | Status: DC

## 2023-08-21 MED ORDER — HYDROXYZINE PAMOATE 25 MG PO CAPS: 25.0000 mg | ORAL_CAPSULE | ORAL | 0 refills | Status: DC | PRN

## 2023-08-21 MED ORDER — TRAZODONE HCL 150 MG PO TABS: 150.0000 mg | ORAL_TABLET | Freq: Every day | ORAL | 2 refills | Status: DC
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
## 2023-10-04 ENCOUNTER — Emergency Department (HOSPITAL_COMMUNITY)

## 2023-10-04 ENCOUNTER — Encounter (HOSPITAL_COMMUNITY): Payer: Self-pay

## 2023-10-04 ENCOUNTER — Other Ambulatory Visit: Payer: Self-pay

## 2023-10-04 ENCOUNTER — Emergency Department (HOSPITAL_COMMUNITY)
Admission: EM | Admit: 2023-10-04 | Discharge: 2023-10-04 | Disposition: A | Discharge status: Home / Self Care | Attending: Emergency Medicine | Admitting: Emergency Medicine

## 2023-10-04 DIAGNOSIS — R079 Chest pain, unspecified: Secondary | ICD-10-CM | POA: Diagnosis present

## 2023-10-04 LAB — CBC
HCT: 36 % (ref 36.0–46.0)
Hemoglobin: 11.2 g/dL — ABNORMAL LOW (ref 12.0–15.0)
MCH: 27.9 pg (ref 26.0–34.0)
MCHC: 31.1 g/dL (ref 30.0–36.0)
MCV: 89.6 fL (ref 80.0–100.0)
Platelets: 277 K/uL (ref 150–400)
RBC: 4.02 MIL/uL (ref 3.87–5.11)
RDW: 15.1 % (ref 11.5–15.5)
WBC: 8.3 K/uL (ref 4.0–10.5)
nRBC: 0 % (ref 0.0–0.2)

## 2023-10-04 LAB — BASIC METABOLIC PANEL WITH GFR
Anion gap: 10 (ref 5–15)
BUN: 9 mg/dL (ref 6–20)
CO2: 21 mmol/L — ABNORMAL LOW (ref 22–32)
Calcium: 8.7 mg/dL — ABNORMAL LOW (ref 8.9–10.3)
Chloride: 104 mmol/L (ref 98–111)
Creatinine, Ser: 0.84 mg/dL (ref 0.44–1.00)
GFR, Estimated: >60 mL/min (ref >60)
Glucose, Bld: 88 mg/dL (ref 70–99)
Potassium: 3.6 mmol/L (ref 3.5–5.1)
Sodium: 135 mmol/L (ref 135–145)

## 2023-10-04 LAB — RESP PANEL BY RT-PCR (RSV, FLU A&B, COVID)  RVPGX2
Influenza A by PCR: NEGATIVE
Influenza B by PCR: NEGATIVE
Resp Syncytial Virus by PCR: NEGATIVE
SARS Coronavirus 2 by RT PCR: NEGATIVE

## 2023-10-04 LAB — TROPONIN I (HIGH SENSITIVITY)
Troponin I (High Sensitivity): 3 ng/L (ref <18)
Troponin I (High Sensitivity): 3 ng/L (ref <18)

## 2023-10-04 LAB — D-DIMER, QUANTITATIVE: D-Dimer, Quant: 0.41 ug{FEU}/mL (ref 0.00–0.50)

## 2023-10-04 NOTE — ED Notes (Signed)
 Patient transported to X-ray

## 2023-10-04 NOTE — ED Provider Notes (Signed)
 [place] EMERGENCY DEPARTMENT AT [facility] Provider Note   CSN: [id number] Arrival date & time: 10/04/23  1350     Patient presents with: Chest Pain   Kathleen Odonnell is a 53 y.o. female with past medical history of obesity, MDD, ADHD presenting to ER with complaint of chest pain. Patient reports CP was central, lasting about 2 hours, radiating to right arm, occurred at rest while on the phone. CP is gone now. She reports when CP was present, felt hard to breath. She has had three days of dry cough this this. She has not had fever, swelling in lower extremities, abdominal pain, NVD. No history of DVT/PE, no recent surgery, no recent prolonged travel, no OCP. No noted exertional CP, SOB. No similar episodes in past.      Chest Pain      Prior to Admission medications   Medication Sig Start Date End Date Taking? Authorizing Provider  buPROPion  (WELLBUTRIN  XL) 300 MG 24 hr tablet Take 1 tablet (300 mg total) by mouth daily. 08/21/23   Arfeen, Leni DASEN, MD  Cholecalciferol (VITAMIN D3) 50 MCG (2000 UT) capsule Take 1 capsule (2,000 Units total) by mouth daily. 05/26/22   Francyne Romano, MD  citalopram  (CELEXA ) 40 MG tablet Take 1 tablet (40 mg total) by mouth daily. 08/21/23   Arfeen, Leni DASEN, MD  hydrOXYzine  (VISTARIL ) 25 MG capsule Take 1 capsule (25 mg total) by mouth as needed for anxiety (sleep). 08/21/23   Arfeen, Leni DASEN, MD  Multiple Vitamin (MULTIVITAMIN) capsule Take 1 capsule by mouth daily.    [provider]  topiramate  (TOPAMAX ) 25 MG tablet TAKE 1 TABLET(25 MG) BY MOUTH TWICE DAILY 01/24/23   Francyne Romano, MD  topiramate  (TOPAMAX ) 50 MG tablet TAKE 1 TABLET(50 MG) BY MOUTH TWICE DAILY 01/24/23   Francyne Romano, MD  traZODone  (DESYREL ) 150 MG tablet Take 1 tablet (150 mg total) by mouth at bedtime. 08/21/23   Arfeen, Leni DASEN, MD    Allergies: Carbamazepine    Review of Systems  Cardiovascular:  Positive for chest pain.    Updated Vital  Signs BP 129/72   Pulse 73   Temp 97.7 F (36.5 C) (Oral)   Resp 18   Ht 4' 11 (1.499 m)   Wt 99.8 kg   SpO2 100%   BMI 44.43 kg/m   Physical Exam Vitals and nursing note reviewed.  Constitutional:      General: She is not in acute distress.    Appearance: She is not toxic-appearing.  HENT:     Head: Normocephalic and atraumatic.  Eyes:     General: No scleral icterus.    Conjunctiva/sclera: Conjunctivae normal.  Cardiovascular:     Rate and Rhythm: Normal rate and regular rhythm.     Pulses: Normal pulses.     Heart sounds: Normal heart sounds.  Pulmonary:     Effort: Pulmonary effort is normal. No respiratory distress.     Breath sounds: Normal breath sounds.  Chest:     Chest wall: No tenderness.  Abdominal:     General: Abdomen is flat. Bowel sounds are normal.     Palpations: Abdomen is soft.     Tenderness: There is no abdominal tenderness.  Musculoskeletal:     Right lower leg: No edema.     Left lower leg: No edema.  Skin:    General: Skin is warm and dry.     Findings: No lesion.  Neurological:     General:  No focal deficit present.     Mental Status: She is alert and oriented to person, place, and time. Mental status is at baseline.     (all labs ordered are listed, but only abnormal results are displayed) Labs Reviewed  BASIC METABOLIC PANEL WITH GFR - Abnormal; Notable for the following components:      Result Value   CO2 21 (*)    Calcium 8.7 (*)    All other components within normal limits  CBC - Abnormal; Notable for the following components:   Hemoglobin 11.2 (*)    All other components within normal limits  RESP PANEL BY RT-PCR (RSV, FLU A&B, COVID)  [id number]  D-DIMER, QUANTITATIVE  TROPONIN I (HIGH SENSITIVITY)  TROPONIN I (HIGH SENSITIVITY)    EKG: EKG Interpretation Date/Time:  Wednesday October 04 2023 13:58:35 EDT Ventricular Rate:  71 PR Interval:  152 QRS Duration:  74 QT Interval:  414 QTC Calculation: 449 R  Axis:   -28  Text Interpretation: Normal sinus rhythm Anterior infarct , age undetermined Abnormal ECG No previous ECGs available Confirmed by Midge Golas ([id number]) on 10/04/2023 3:26:47 PM  Radiology: DG Chest 2 View Result Date: 10/04/2023 CLINICAL DATA:  Chest pain. EXAM: CHEST - 2 VIEW COMPARISON:  None Available. FINDINGS: The heart size and mediastinal contours are within normal limits. Both lungs are clear. The visualized skeletal structures are unremarkable. IMPRESSION: No active cardiopulmonary disease. Electronically Signed   By: Lynwood Landy Raddle M.D.   On: 10/04/2023 15:08     Procedures   Medications Ordered in the ED - No data to display                                  Medical Decision Making Amount and/or Complexity of Data Reviewed Labs: ordered. Radiology: ordered.   This patient presents to the ED for concern of chest pain, this involves an extensive number of treatment options, and is a complaint that carries with it a high risk of complications and morbidity.  The differential diagnosis includes ACS, stable angina, CHF, pneumonia, pneumothorax, aortic dissection, pulmonary embolus    Co morbidities that complicate the patient evaluation  PreDM Obesity  MDD   Lab Tests:  I personally interpreted labs.  The pertinent results include:  CBC and BMP unremarkable Troponin 3, delta 3 Dimmer unremarkable  Resp Panel Negative      Imaging Studies ordered:  I ordered imaging studies including chest x-ray I independently visualized and interpreted imaging which showed no acute finding.  I agree with the radiologist interpretation   Cardiac Monitoring: / EKG:  The patient was maintained on a cardiac monitor.  I personally viewed and interpreted the cardiac monitored which showed an underlying rhythm of: normal sinus rhythm    Problem List / ED Course / Critical interventions / Medication management  Patient's presenting to emergency room with complaint  of chest pain.  Patient denies any exertional component to this nor significant shortness of breath.  On arrival she is hemodynamically stable and she is well-appearing.  She is currently chest pain-free.  Her EKG is nonischemic and troponins are negative thus doubt ACS as cause of pain.  Her D-dimer is reassuring thus doubt DVT/PE at this time.  She does not have any sign of pneumonia nor pneumothorax chest x-ray.  She does not have any signs of fluid overload on exam nor congestion on chest x-ray.  Given reassuring workup  feel she stable for outpatient follow-up.  Will refer to cardiology.  Her chest pain was in the setting of cough and possible viral URI. I have reviewed the patients home medicines and have made adjustments as needed. Patient's vitals are stable and she is well-appearing.  She was given return precautions.  Feel stable for discharge with outpatient follow-up.      Final diagnoses:  Chest pain, unspecified type    ED Discharge Orders          Ordered    Ambulatory referral to Cardiology       Comments: If you have not heard from the Cardiology office within the next 72 hours please call [phone].   10/04/23 1717               Kamyra Schroeck, Warren SAILOR, PA-C 10/04/23 1914    Midge Golas, MD 10/04/23 2017

## 2023-10-04 NOTE — Discharge Instructions (Signed)
 Please follow-up with cardiology.  Lab work and imaging overall reassuring.  Return to emergency room with new or worsening symptoms.

## 2023-10-04 NOTE — ED Triage Notes (Signed)
 PT BIB EMS for c/o cp radiates to back was 10/10, after aspirin 6/10, after nitroglycerin 2/10 Pt also began having SOB when CP started. CP is central and sharp. 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

## 2023-10-14 ENCOUNTER — Encounter (HOSPITAL_COMMUNITY): Payer: Self-pay

## 2023-10-23 NOTE — Progress Notes (Signed)
 Cardiology Office Note   Date:  11/06/2023  ID:  Kathleen Odonnell, DOB 01-11-1970, MRN [id number] PCP: [name] Pfeiffer, PA-C  [facility] Providers Cardiologist:  None     History of Present Illness Kathleen Odonnell is a 53 y.o. female with a past medical history of GAD, obesity, ADHD, vitamin D  deficiency. Patient presents today for evaluation of chest pain   Patient had been seen in the ED on 10/04/23 for evaluation of chest pain. Pain lasted about 2 hours. Occurred while she was resting and talking on the phone. hsTn negative x2. D-dimer negative. COVID, flu, RSV negative. CXR without active cardiopulmonary disease. EKG without ischemic changes. She was discharged from the ED with outpatient cardiology follow up   Today, patient presents for an ED follow-up appointment.  Tells me that on 10/1, she had been at work when she bent over and started having chest pain.  Chest pain was very sharp, located in the middle of her chest.  Lasted for about 1 hour.  She was given nitroglycerin with EMS and this helped her symptoms.  She denies ever having chest pain before, has not had chest pain since this event.  However, she has been having issues with dyspnea on exertion for about a year.  This limits her physical activity quite a bit.  She notes that her coworkers have asked her why she is so short of breath after walking around the office or walking up and down stairs.  She occasionally has palpitations if she is anxious.  Also admits to drinking quite a bit of caffeine.  Does a decent job staying hydrated.  She had lower extremity swelling, this resolved when she cut salt out of her diet.  She has a family history of CAD, her grandmother had triple bypass surgery.  She denies known history of hypertension.  Her LDL was 136, for now has just been managed with her diet.  She smoked socially in college, no recent tobacco use.   Studies Reviewed EKG Interpretation Date/Time:  Monday  November 06 2023 09:50:51 EST Ventricular Rate:  87 PR Interval:  154 QRS Duration:  72 QT Interval:  366 QTC Calculation: 440 R Axis:   -43  Text Interpretation: Normal sinus rhythm Left axis deviation Low voltage QRS Cannot rule out Anterior infarct (cited on or before 04-Oct-2023) When compared with ECG of 04-Oct-2023 13:58, No significant change was found Confirmed by Vicci Sauer [phone]) on 11/06/2023 9:52:53 AM    Risk Assessment/Calculations           Physical Exam VS:  BP 118/60   Pulse 97   Ht 4' 11.75 (1.518 m)   Wt 223 lb (101.2 kg)   SpO2 99%   BMI 43.92 kg/m        Wt Readings from Last 3 Encounters:  11/06/23 223 lb (101.2 kg)  10/04/23 220 lb (99.8 kg)  02/23/23 201 lb (91.2 kg)    GEN: Well nourished, well developed in no acute distress.  Sitting comfortably on the exam table NECK: No JVD  CARDIAC:  RRR, no murmurs, rubs, gallops.  Radial pulses 2+ bilaterally RESPIRATORY:  Clear to auscultation without rales, wheezing or rhonchi.  Normal work of breathing on room air ABDOMEN: Soft, non-tender, non-distended EXTREMITIES:  No edema in bilateral lower extremities; No deformity   ASSESSMENT AND PLAN  Chest pain  DOE  - Patient was seen in the ED on 10/1 for evaluation of chest pain.  High-sensitivity troponin negative x 2,  D-dimer negative, EKG without ischemic changes. -Today, patient tells me that her episode of chest pain on 10/1 occurred while at work.  Pain was sharp, located in the middle of her chest.  Lasted about 1 hour and was relieved with nitroglycerin.  Denies ever having chest pain before, has not had chest pain since being seen in the ED - For the past year or so, patient has been having issues with shortness of breath on exertion.  This limits her physical activity quite a bit.  Gets short of breath when walking on flat ground, going up and down stairs, at work. - Patient euvolemic on exam today - EKG today nonischemic  - Ordered  echocardiogram - Ordered coronary CTA-ordered metoprolol 100 mg to be given prior to scan.  Creatinine 0.84 on 10/1  HLD  - Lipid panel from 01/2023 showed LDL 136, HDL 55, triglycerides 104, total cholesterol 210 - Start Crestor 10 mg daily - Needs repeat lipids, LFTs in 2-3 months  Palpitations  - Patient tells me she has episodes of palpitations when she is stressed.  Also admits to drinking quite a bit of caffeine.  Palpitations are usually brief - EKG today shows normal sinus rhythm - Discussed triggers for palpitations including caffeine, stress, dehydration.  Encourage patient to try to cut back on caffeine.  She has a good job staying hydrated - If palpitations continue despite reducing caffeine and managing stress, could consider cardiac monitor  Dispo: Follow up with Dr. Jordan in 2-3 months to establish care   Signed, Rollo FABIENE Louder, PA-C

## 2023-11-06 ENCOUNTER — Ambulatory Visit: Attending: Cardiology | Admitting: Cardiology

## 2023-11-06 ENCOUNTER — Encounter: Payer: Self-pay | Admitting: Cardiology

## 2023-11-06 VITALS — BP 118/60 | HR 97 | Ht 59.75 in | Wt 223.0 lb

## 2023-11-06 DIAGNOSIS — R072 Precordial pain: Secondary | ICD-10-CM

## 2023-11-06 DIAGNOSIS — R0609 Other forms of dyspnea: Secondary | ICD-10-CM | POA: Diagnosis not present

## 2023-11-06 DIAGNOSIS — E782 Mixed hyperlipidemia: Secondary | ICD-10-CM

## 2023-11-06 MED ORDER — ROSUVASTATIN CALCIUM 10 MG PO TABS: 10.0000 mg | ORAL_TABLET | Freq: Every day | ORAL | 3 refills | Status: AC

## 2023-11-06 MED ORDER — METOPROLOL TARTRATE 100 MG PO TABS: ORAL_TABLET | ORAL | 0 refills | Status: AC

## 2023-11-06 NOTE — Patient Instructions (Signed)
 Medication Instructions:  START: Rosuvastation 10 mg daily *If you need a refill on your cardiac medications before your next appointment, please call your pharmacy*  Lab Work: NONE If you have labs (blood work) drawn today and your tests are completely normal, you will receive your results only by: MyChart Message (if you have MyChart) OR A paper copy in the mail If you have any lab test that is abnormal or we need to change your treatment, we will call you to review the results.  Testing/Procedures: Your physician has requested that you have an echocardiogram. Echocardiography is a painless test that uses sound waves to create images of your heart. It provides your doctor with information about the size and shape of your heart and how well your heart's chambers and valves are working. This procedure takes approximately one hour. There are no restrictions for this procedure. Please do NOT wear cologne, perfume, aftershave, or lotions (deodorant is allowed). Please arrive 15 minutes prior to your appointment time.  Please note: We ask at that you not bring children with you during ultrasound (echo/ vascular) testing. Due to room size and safety concerns, children are not allowed in the ultrasound rooms during exams. Our front office staff cannot provide observation of children in our lobby area while testing is being conducted. An adult accompanying a patient to their appointment will only be allowed in the ultrasound room at the discretion of the ultrasound technician under special circumstances. We apologize for any inconvenience.     Your cardiac CT will be scheduled at one of the below locations:    [facility] [address]  [place], [place] [address]   If scheduled at [facility], please arrive at the [facility] and Children's Entrance (Entrance C2) of [facility] 30 minutes prior to test start time. You can use the FREE valet parking  offered at entrance C (encouraged to control the heart rate for the test)  Proceed to the [facility] Radiology Department (first floor) to check-in and test prep.  All radiology patients and guests should use entrance C2 at [facility], accessed from [facility], even though the hospital's physical address listed is [address]  If scheduled at the Heart and Vascular Tower at [address], please enter the parking lot using the [address] entrance and use the FREE valet service at the patient drop-off area. Enter the building and check-in with registration on the main floor.   Please follow these instructions carefully (unless otherwise directed):  An IV will be required for this test and Nitroglycerin will be given.    On the Night Before the Test: Be sure to Drink plenty of water. Do not consume any caffeinated/decaffeinated beverages or chocolate 12 hours prior to your test. Do not take any antihistamines 12 hours prior to your test.   On the Day of the Test: Drink plenty of water until 1 hour prior to the test. Do not eat any food 1 hour prior to test. You may take your regular medications prior to the test.  Take metoprolol (Lopressor) two hours prior to test. If you take Furosemide/Hydrochlorothiazide/Spironolactone/Chlorthalidone, please HOLD on the morning of the test. Patients who wear a continuous glucose monitor MUST remove the device prior to scanning. FEMALES- please wear underwire-free bra if available, avoid dresses & tight clothing        After the Test: Drink plenty of water. After receiving IV contrast, you may experience  a mild flushed feeling. This is normal. On occasion, you may experience a mild rash up to 24 hours after the test. This is not dangerous. If this occurs, you can take Benadryl 25 mg, Zyrtec, Claritin, or Allegra and increase your fluid intake. (Patients taking Tikosyn should avoid Benadryl, and may take  Zyrtec, Claritin, or Allegra) If you experience trouble breathing, this can be serious. If it is severe call 911 IMMEDIATELY. If it is mild, please call our office.  We will call to schedule your test 2-4 weeks out understanding that some insurance companies will need an authorization prior to the service being performed.   For more information and frequently asked questions, please visit our website : [email or web address]  For non-scheduling related questions, please contact the cardiac imaging nurse navigator should you have any questions/concerns: Cardiac Imaging Nurse Navigators Direct Office Dial: [phone]   For scheduling needs, including cancellations and rescheduling, please call Brittany, [phone].   Follow-Up: At [facility], you and your health needs are our priority.  As part of our continuing mission to provide you with exceptional heart care, our providers are all part of one team.  This team includes your primary Cardiologist (physician) and Advanced Practice Providers or APPs (Physician Assistants and Nurse Practitioners) who all work together to provide you with the care you need, when you need it.  Your next appointment:   3 month(s)  Provider:   Dr. Jordan

## 2023-11-20 ENCOUNTER — Encounter (HOSPITAL_COMMUNITY): Payer: Self-pay | Admitting: Psychiatry

## 2023-11-20 ENCOUNTER — Telehealth (HOSPITAL_BASED_OUTPATIENT_CLINIC_OR_DEPARTMENT_OTHER): Admitting: Psychiatry

## 2023-11-20 VITALS — Wt 223.0 lb

## 2023-11-20 DIAGNOSIS — F331 Major depressive disorder, recurrent, moderate: Secondary | ICD-10-CM

## 2023-11-20 DIAGNOSIS — F902 Attention-deficit hyperactivity disorder, combined type: Secondary | ICD-10-CM | POA: Diagnosis not present

## 2023-11-20 DIAGNOSIS — F4310 Post-traumatic stress disorder, unspecified: Secondary | ICD-10-CM

## 2023-11-20 MED ORDER — HYDROXYZINE PAMOATE 25 MG PO CAPS: 25.0000 mg | ORAL_CAPSULE | ORAL | 0 refills | Status: AC | PRN

## 2023-11-20 MED ORDER — BUPROPION HCL ER (XL) 300 MG PO TB24: 300.0000 mg | ORAL_TABLET | Freq: Every day | ORAL | 2 refills | Status: AC

## 2023-11-20 MED ORDER — CITALOPRAM HYDROBROMIDE 40 MG PO TABS: 40.0000 mg | ORAL_TABLET | Freq: Every day | ORAL | 2 refills | Status: AC

## 2023-11-20 MED ORDER — TRAZODONE HCL 150 MG PO TABS: 150.0000 mg | ORAL_TABLET | Freq: Every day | ORAL | 2 refills | Status: AC
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
## 2023-11-27 ENCOUNTER — Encounter (HOSPITAL_COMMUNITY): Payer: Self-pay

## 2023-11-29 ENCOUNTER — Telehealth (HOSPITAL_COMMUNITY): Payer: Self-pay | Admitting: Emergency Medicine

## 2023-11-29 NOTE — Telephone Encounter (Signed)
 Attempted to call patient regarding upcoming cardiac CT appointment. Left message on voicemail with name and callback number Rockwell Alexandria RN Navigator Cardiac Imaging [facility] [phone] Office [phone] Cell

## 2023-12-01 ENCOUNTER — Other Ambulatory Visit: Payer: Self-pay | Admitting: Cardiology

## 2023-12-01 ENCOUNTER — Ambulatory Visit (HOSPITAL_COMMUNITY)
Admission: RE | Admit: 2023-12-01 | Discharge: 2023-12-01 | Disposition: A | Discharge status: Home / Self Care | Payer: Self-pay | Source: Ambulatory Visit | Attending: Cardiology | Admitting: Cardiology

## 2023-12-01 ENCOUNTER — Other Ambulatory Visit: Payer: Self-pay | Admitting: Student

## 2023-12-01 ENCOUNTER — Ambulatory Visit (HOSPITAL_COMMUNITY)
Admission: RE | Admit: 2023-12-01 | Discharge: 2023-12-01 | Disposition: A | Discharge status: Home / Self Care | Payer: Self-pay | Source: Ambulatory Visit | Attending: Student | Admitting: Student

## 2023-12-01 DIAGNOSIS — M79601 Pain in right arm: Secondary | ICD-10-CM | POA: Diagnosis present

## 2023-12-01 DIAGNOSIS — R0609 Other forms of dyspnea: Secondary | ICD-10-CM | POA: Insufficient documentation

## 2023-12-01 DIAGNOSIS — R072 Precordial pain: Secondary | ICD-10-CM

## 2023-12-01 DIAGNOSIS — E782 Mixed hyperlipidemia: Secondary | ICD-10-CM

## 2023-12-01 LAB — ECHOCARDIOGRAM COMPLETE
Area-P 1/2: 4.17 cm2
S' Lateral: 2.7 cm

## 2023-12-01 MED ORDER — NITROGLYCERIN 0.4 MG SL SUBL
SUBLINGUAL_TABLET | SUBLINGUAL | Status: AC
  Filled 2023-12-01: qty 2

## 2023-12-01 MED ORDER — IOHEXOL 350 MG/ML SOLN
100.0000 mL | Freq: Once | INTRAVENOUS | Status: AC | PRN
  Administered 2023-12-01: 100 mL via INTRAVENOUS

## 2023-12-01 MED ORDER — NITROGLYCERIN 0.4 MG SL SUBL
0.8000 mg | SUBLINGUAL_TABLET | Freq: Once | SUBLINGUAL | Status: AC
  Administered 2023-12-01: 0.8 mg via SUBLINGUAL

## 2023-12-01 NOTE — Progress Notes (Addendum)
  Evaluation after Contrast Extravasation  Patient seen and examined in [facility] IR after contrast extravasation while in Cardiovascular center across the street from [facility].  Exam: There is swelling at the right [facility] area.  There is no erythema. There is mild discoloration. There are no blisters. There are no signs of decreased perfusion of the skin.  It is not warm to touch.  The patient has intact ROM in fingers.  Radial pulse is normal.  Per contrast extravasation protocol, I have instructed the patient to keep an ice pack on the area for 20-60 minutes at a time for about 48 hours.   Keep arm elevated as much as possible.   The patient understands to call the radiology department or go to the emergency department if there is: - increase in pain or swelling - changed or altered sensation in the right hand - ulceration or blistering - increasing redness - warmth or increasing firmness - decreased tissue perfusion as noted by decreased capillary refill or discoloration of skin - decreased pulses peripheral to site  Will call the patient on Monday for f/u.   Chessa Barrasso H Kimberlyann Hollar PA-C 12/01/2023 12:35 PM

## 2023-12-01 NOTE — Progress Notes (Addendum)
 PIV attempted x 3 - 22g in R prox forearm obtained and patent with hand flush and saline test injection.   Contrast extravasation - estimated 85cc contrast administered. Attempted to aspirate contrast after- nothing returned. PIV removed and catheter intact. Swelling and skin discoloration noted. Notified cardiac reader Luiz) who assessed extravasation site and recommended patient be evaluated by radiology APP. Strong pulses noted distally. Full range of motion noted. Pt reports severe pain.   Called [facility] office and spoke to Amy who stated she would evaluate the patient.  Pt insertion site covered with bandage and given ice pack. Ambulatory to [facility] with CT RN.  Camie Shutter RN Navigator Cardiac Imaging [facility] V[facility] Services [phone] Office  [phone] Cell

## 2023-12-02 ENCOUNTER — Ambulatory Visit: Payer: Self-pay | Admitting: Cardiology

## 2023-12-04 ENCOUNTER — Telehealth: Payer: Self-pay | Admitting: Student

## 2023-12-04 NOTE — Telephone Encounter (Signed)
 Patient was called for R AC contrast extravasation f/u, full name and DO verified.   She states that the swelling is much better now, there is still small bruise.  Right hand is warm and sensation is intact.  Informed the patient that she can continue the ice pack until swelling resolves, she verbalized understanding.  Patient was encouraged to call IR for questions and concerns.    Jemimah Cressy H Aslin Farinas PA-C 12/04/2023 1:52 PM

## 2023-12-21 ENCOUNTER — Other Ambulatory Visit (HOSPITAL_COMMUNITY): Payer: Self-pay | Admitting: Psychiatry

## 2023-12-21 DIAGNOSIS — F4310 Post-traumatic stress disorder, unspecified: Secondary | ICD-10-CM

## 2024-02-06 ENCOUNTER — Other Ambulatory Visit: Payer: Self-pay | Admitting: Family Medicine

## 2024-02-06 DIAGNOSIS — Z1231 Encounter for screening mammogram for malignant neoplasm of breast: Secondary | ICD-10-CM

## 2024-02-19 ENCOUNTER — Telehealth (HOSPITAL_COMMUNITY): Admitting: Psychiatry

## 2024-05-17 ENCOUNTER — Ambulatory Visit
